# Patient Record
Sex: Female | Born: 1945
Health system: Southern US, Community
[De-identification: ages and names within clinical notes are randomized; demographics above are authoritative.]

## PROBLEM LIST (undated history)

## (undated) DIAGNOSIS — R011 Cardiac murmur, unspecified: Secondary | ICD-10-CM

## (undated) DIAGNOSIS — J189 Pneumonia, unspecified organism: Secondary | ICD-10-CM

## (undated) DIAGNOSIS — L509 Urticaria, unspecified: Secondary | ICD-10-CM

## (undated) DIAGNOSIS — I10 Essential (primary) hypertension: Secondary | ICD-10-CM

## (undated) DIAGNOSIS — M349 Systemic sclerosis, unspecified: Secondary | ICD-10-CM

## (undated) DIAGNOSIS — M1990 Unspecified osteoarthritis, unspecified site: Secondary | ICD-10-CM

## (undated) DIAGNOSIS — T783XXA Angioneurotic edema, initial encounter: Secondary | ICD-10-CM

## (undated) DIAGNOSIS — T7840XA Allergy, unspecified, initial encounter: Secondary | ICD-10-CM

## (undated) HISTORY — PX: JOINT REPLACEMENT: SHX530

## (undated) HISTORY — PX: TONSILLECTOMY: SUR1361

## (undated) HISTORY — DX: Systemic sclerosis, unspecified: M34.9

## (undated) HISTORY — DX: Unspecified osteoarthritis, unspecified site: M19.90

## (undated) HISTORY — DX: Allergy, unspecified, initial encounter: T78.40XA

## (undated) HISTORY — DX: Urticaria, unspecified: L50.9

## (undated) HISTORY — PX: ADENOIDECTOMY: SUR15

## (undated) HISTORY — DX: Angioneurotic edema, initial encounter: T78.3XXA

## (undated) HISTORY — PX: EYE SURGERY: SHX253

---

## 2003-01-03 ENCOUNTER — Emergency Department (HOSPITAL_COMMUNITY): Admission: EM | Admit: 2003-01-03 | Discharge: 2003-01-03 | Payer: Self-pay | Admitting: Emergency Medicine

## 2003-06-24 ENCOUNTER — Other Ambulatory Visit: Admission: RE | Admit: 2003-06-24 | Discharge: 2003-06-24 | Payer: Self-pay | Admitting: Internal Medicine

## 2003-06-30 ENCOUNTER — Ambulatory Visit (HOSPITAL_COMMUNITY): Admission: RE | Admit: 2003-06-30 | Discharge: 2003-06-30 | Payer: Self-pay | Admitting: Gastroenterology

## 2003-06-30 ENCOUNTER — Encounter (INDEPENDENT_AMBULATORY_CARE_PROVIDER_SITE_OTHER): Payer: Self-pay | Admitting: *Deleted

## 2003-09-25 ENCOUNTER — Encounter: Admission: RE | Admit: 2003-09-25 | Discharge: 2003-09-25 | Payer: Self-pay | Admitting: Dermatology

## 2005-09-12 ENCOUNTER — Emergency Department (HOSPITAL_COMMUNITY): Admission: EM | Admit: 2005-09-12 | Discharge: 2005-09-13 | Payer: Self-pay | Admitting: Emergency Medicine

## 2008-11-10 ENCOUNTER — Ambulatory Visit (HOSPITAL_COMMUNITY): Admission: RE | Admit: 2008-11-10 | Discharge: 2008-11-10 | Payer: Self-pay | Admitting: Family Medicine

## 2008-11-10 ENCOUNTER — Ambulatory Visit: Payer: Self-pay

## 2008-11-10 ENCOUNTER — Ambulatory Visit: Payer: Self-pay | Admitting: Internal Medicine

## 2008-11-10 ENCOUNTER — Encounter (INDEPENDENT_AMBULATORY_CARE_PROVIDER_SITE_OTHER): Payer: Self-pay | Admitting: Family Medicine

## 2008-12-02 ENCOUNTER — Telehealth (INDEPENDENT_AMBULATORY_CARE_PROVIDER_SITE_OTHER): Payer: Self-pay | Admitting: *Deleted

## 2008-12-02 ENCOUNTER — Encounter: Payer: Self-pay | Admitting: Cardiovascular Disease

## 2008-12-22 DIAGNOSIS — R011 Cardiac murmur, unspecified: Secondary | ICD-10-CM | POA: Insufficient documentation

## 2008-12-22 DIAGNOSIS — I10 Essential (primary) hypertension: Secondary | ICD-10-CM | POA: Insufficient documentation

## 2008-12-23 ENCOUNTER — Ambulatory Visit: Payer: Self-pay | Admitting: Cardiovascular Disease

## 2009-03-29 ENCOUNTER — Encounter: Admission: RE | Admit: 2009-03-29 | Discharge: 2009-03-29 | Payer: Self-pay | Admitting: Orthopedic Surgery

## 2009-03-31 ENCOUNTER — Encounter: Payer: Self-pay | Admitting: Cardiovascular Disease

## 2009-04-28 ENCOUNTER — Inpatient Hospital Stay (HOSPITAL_COMMUNITY): Admission: RE | Admit: 2009-04-28 | Discharge: 2009-05-01 | Payer: Self-pay | Admitting: Orthopedic Surgery

## 2009-05-26 ENCOUNTER — Inpatient Hospital Stay (HOSPITAL_COMMUNITY): Admission: RE | Admit: 2009-05-26 | Discharge: 2009-05-29 | Payer: Self-pay | Admitting: Orthopedic Surgery

## 2009-12-31 ENCOUNTER — Ambulatory Visit (HOSPITAL_COMMUNITY)
Admission: RE | Admit: 2009-12-31 | Discharge: 2009-12-31 | Payer: Self-pay | Source: Home / Self Care | Attending: Cardiovascular Disease | Admitting: Cardiovascular Disease

## 2009-12-31 ENCOUNTER — Encounter: Payer: Self-pay | Admitting: Cardiovascular Disease

## 2009-12-31 ENCOUNTER — Ambulatory Visit: Payer: Self-pay

## 2010-01-08 ENCOUNTER — Telehealth: Payer: Self-pay | Admitting: Cardiovascular Disease

## 2010-01-10 HISTORY — PX: SHOULDER ACROMIOPLASTY: SHX6093

## 2010-01-12 ENCOUNTER — Encounter: Payer: Self-pay | Admitting: Cardiovascular Disease

## 2010-01-12 ENCOUNTER — Ambulatory Visit
Admission: RE | Admit: 2010-01-12 | Discharge: 2010-01-12 | Payer: Self-pay | Source: Home / Self Care | Attending: Cardiovascular Disease | Admitting: Cardiovascular Disease

## 2010-02-09 NOTE — Consult Note (Signed)
Summary: [facility]  [facility] Form   Imported By: Sallee Provencal 12/25/2008 10:37:31  _____________________________________________________________________  External Attachment:    Type:   Image     Comment:   External Document

## 2010-02-09 NOTE — Assessment & Plan Note (Signed)
Summary: NP3/ MUMUR. SYSTOLIC  DISFUNTION/ PT HAS BCBS/ GD   Visit Type:  Initial Consult Primary Provider:  Dr Carlota Raspberry  CC:  heart murmur.  History of Present Illness: Linda Allen is seen today at the request of Merri Ray at [facility].  She has HTN and a murmur.  She had had significant wieght issues in the past being close to 350 lbs.  She has slowly lost weight but will likely need knee replacements.  She has had HTN for over 20 years.  She recently had BB's added to her ACE.  She has a history of murmur. I reviewed the echo she had her last month.  She has trivial AS with normal LV funciton and no other abnormalities.  She has some functional dyspnea and no SSCP.  No palpitatoins PND, or syncope.  Current Medications (verified): 1)  Lisinopril 20 Mg Tabs (Lisinopril) .... Take One Tablet By Mouth Daily 2)  Metoprolol Succinate 25 Mg Xr24h-Tab (Metoprolol Succinate) .... Take One Tablet By Mouth Daily  Allergies (verified): 1)  ! Pcn 2)  ! * Contrast Dye  Past History:  Past Medical History: Last updated: 12/22/2008 Current Problems:  MURMUR (ICD-785.2) HYPERTENSION (ICD-401.9)  Past Surgical History: Last updated: 12/22/2008   Screening colonoscopy.  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 All other systems reviewed and negative  Vital Signs:  Patient profile:   65 year old female Height:      68 inches Weight:      288 pounds BMI:     43.95 BP sitting:   139 / 75  (left arm) Cuff size:   large  Vitals Entered By: Lubertha Basque, CNA (December 23, 2008 8:31 AM)  Physical  Exam  General:  Affect appropriate Healthy:  appears stated age 66: normal Neck supple with no adenopathy JVP normal no bruits no thyromegaly Lungs clear with no wheezing and good diaphragmatic motion no rub, gallop or click PMI normal Abdomen: benighn, BS positve, no tenderness, no AAA no bruit.  No HSM or HJR Distal pulses intact with no bruits No edema Neuro non-focal Skin warm and dry    Impression & Recommendations:  Problem # 1:  MURMUR (ICD-785.2)  Mild AS F/U echo in a year Her updated medication list for this problem includes:    Lisinopril 20 Mg Tabs (Lisinopril) .Marland Kitchen... Take one tablet by mouth daily    Metoprolol Succinate 25 Mg Xr24h-tab (Metoprolol succinate) .Marland Kitchen... Take one tablet by mouth daily  Orders: EKG w/ Interpretation (93000)  Her updated medication list for this problem includes:    Lisinopril 20 Mg Tabs (Lisinopril) .Marland Kitchen... Take one tablet by mouth daily    Metoprolol Succinate 25 Mg Xr24h-tab (Metoprolol succinate) .Marland Kitchen... Take one tablet by mouth daily  Problem # 2:  HYPERTENSION (ICD-401.9) F/U with Dr Carlota Raspberry.  Continue low sodium diet and 2 drug Rx Her updated medication list for this problem includes:    Lisinopril 20 Mg Tabs (Lisinopril) .Marland Kitchen... Take one tablet by mouth daily    Metoprolol Succinate 25 Mg Xr24h-tab (Metoprolol succinate) .Marland Kitchen... Take one tablet by mouth daily  Patient Instructions: 1)  Your physician recommends that you schedule a follow-up appointment in:  [place] ECHO IN YEAR 2)  Your physician recommends that you continue on your current medications as directed. Please refer to the Current Medication list given to you today. 3)  Your physician has requested that you have an echocardiogram.  Echocardiography is a painless test that uses sound waves to create images of your heart. It provides your doctor with information about the size and shape of your heart and how well your heart's chambers and valves are  working.  This procedure takes approximately one hour. There are no restrictions for this procedure.   EKG Report  Procedure date:  12/23/2008  Findings:      NSR 72 Normal ECG

## 2010-02-09 NOTE — Letter (Signed)
Summary: [facility] Surgical Clearance   [facility] Surgical Clearance   Imported By: Sallee Provencal 04/15/2009 16:12:55  _____________________________________________________________________  External Attachment:    Type:   Image     Comment:   External Document

## 2010-02-09 NOTE — Progress Notes (Signed)
  Phone Note From Other Clinic   Caller: Mariann Laster Initial call taken by: Lanice Schwab Echo over to Urgent Care to fax [phone] [facility]  December 02, 2008 2:00 PM

## 2010-02-11 NOTE — Assessment & Plan Note (Signed)
Summary: yearly/sl   Visit Type:  1 yr f/u Primary Provider:  Corliss Parish  CC:  no cardiac complaints today.  History of Present Illness: Linda Allen is seen today at the request of Merri Ray at [facility].  She has HTN and a murmur.  She had had significant wieght issues in the past being close to 350 lbs.  She has slowly lost weight but will likely need knee replacements.  She has had HTN for over 20 years.  She recently had BB's added to her ACE.  She has a history of murmur. I reviewed the echo she had her 12/11 and there was mild AS   She has some functional dyspnea and no SSCP.  No palpitatoins PND, or syncope.  Current Medications (verified): 1)  Lisinopril 20 Mg Tabs (Lisinopril) .... Take One Tablet By Mouth Daily 2)  Metoprolol Succinate 25 Mg Xr24h-Tab (Metoprolol Succinate) .... Take One Tablet By Mouth Daily  Allergies: 1)  ! Pcn 2)  ! * Contrast Dye  Vital Signs:  Patient profile:   65 year old female Height:      68 inches Weight:      288.25 pounds BMI:     43.99 Pulse rate:   87 / minute Pulse rhythm:   irregular BP sitting:   128 / 78  (left arm) Cuff size:   large  Vitals Entered By: Julaine Hua, CMA (January 12, 2010 8:41 AM)  Physical Exam  General:  Affect appropriate Healthy:  appears stated age HEENT: normal Neck supple with no adenopathy JVP normal no bruits no thyromegaly Lungs clear with no wheezing and good diaphragmatic motion Heart:  S1/S2  mild AS murmur,rub, gallop or click PMI normal Abdomen: benighn, BS positve, no tenderness, no AAA no bruit.  No HSM or HJR Distal pulses intact with no bruits No edema Neuro non-focal Skin warm and dry    Impression & Recommendations:  Problem # 1:  MURMUR (ICD-785.2)  Mild AS no change in murmur.  Echo in a year Her updated medication list for this problem includes:    Lisinopril 20 Mg Tabs (Lisinopril) .Marland Kitchen... Take one tablet by mouth daily    Metoprolol Succinate 25 Mg Xr24h-tab  (Metoprolol succinate) .Marland Kitchen... Take one tablet by mouth daily  Her updated medication list for this problem includes:    Lisinopril 20 Mg Tabs (Lisinopril) .Marland Kitchen... Take one tablet by mouth daily    Metoprolol Succinate 25 Mg Xr24h-tab (Metoprolol succinate) .Marland Kitchen... Take one tablet by mouth daily  Orders: Echocardiogram (Echo)  Problem # 2:  HYPERTENSION (ICD-401.9)  Well controlled Her updated medication list for this problem includes:    Lisinopril 20 Mg Tabs (Lisinopril) .Marland Kitchen... Take one tablet by mouth daily    Metoprolol Succinate 25 Mg Xr24h-tab (Metoprolol succinate) .Marland Kitchen... Take one tablet by mouth daily  Orders: EKG w/ Interpretation (93000)  Her updated medication list for this problem includes:    Lisinopril 20 Mg Tabs (Lisinopril) .Marland Kitchen... Take one tablet by mouth daily    Metoprolol Succinate 25 Mg Xr24h-tab (Metoprolol succinate) .Marland Kitchen... Take one tablet by mouth daily  Problem # 3:  MORBID OBESITY (ICD-278.01) Has had bilateral knee surgery with increased mobility Also right shoulder surgery.  Rehab has gone well.  She is very motivated to exercise and think she will do well.  Patient Instructions: 1)  Your physician recommends that you schedule a follow-up appointment in: [place] 2)  Your physician recommends that you continue on your  current medications as directed. Please refer to the Current Medication list given to you today. 3)  Your physician has requested that you have an echocardiogram.  Echocardiography is a painless test that uses sound waves to create images of your heart. It provides your doctor with information about the size and shape of your heart and how well your heart's chambers and valves are working.  This procedure takes approximately one hour. There are no restrictions for this procedure.ECHO SAME DAY

## 2010-02-11 NOTE — Progress Notes (Signed)
Summary: echo results**2nd call**  Phone Note Call from Patient Call back at Home Phone [phone]   Caller: Patient Reason for Call: Talk to Nurse, Lab or Test Results Summary of Call: pt calling re echo results. Initial call taken by: Regan Lemming,  January 08, 2010 8:59 AM  Follow-up for Phone Call        pt has to go out and she was rtning your call from yesterday but you can call and talk to her husband because she has to go out Shelda Pal  January 08, 2010 9:27 AM     Additional Follow-up for Phone Call Additional follow up Details #1::        Pt husband is aware of results. Horton Chin RN

## 2010-03-29 LAB — BASIC METABOLIC PANEL
BUN: 12 mg/dL (ref 6–23)
BUN: 18 mg/dL (ref 6–23)
CO2: 28 mEq/L (ref 19–32)
CO2: 29 mEq/L (ref 19–32)
Calcium: 8.5 mg/dL (ref 8.4–10.5)
Calcium: 8.8 mg/dL (ref 8.4–10.5)
Chloride: 103 mEq/L (ref 96–112)
Chloride: 106 mEq/L (ref 96–112)
Creatinine, Ser: 0.85 mg/dL (ref 0.4–1.2)
Creatinine, Ser: 0.9 mg/dL (ref 0.4–1.2)
GFR calc Af Amer: >60 mL/min (ref >60)
GFR calc Af Amer: >60 mL/min (ref >60)
GFR calc non Af Amer: >60 mL/min (ref >60)
GFR calc non Af Amer: >60 mL/min (ref >60)
Glucose, Bld: 100 mg/dL — ABNORMAL HIGH (ref 70–99)
Glucose, Bld: 121 mg/dL — ABNORMAL HIGH (ref 70–99)
Potassium: 3.8 mEq/L (ref 3.5–5.1)
Potassium: 4.8 mEq/L (ref 3.5–5.1)
Sodium: 136 mEq/L (ref 135–145)
Sodium: 140 mEq/L (ref 135–145)

## 2010-03-29 LAB — CBC
HCT: 24 % — ABNORMAL LOW (ref 36.0–46.0)
HCT: 25.1 % — ABNORMAL LOW (ref 36.0–46.0)
Hemoglobin: 7.9 g/dL — ABNORMAL LOW (ref 12.0–15.0)
Hemoglobin: 8.4 g/dL — ABNORMAL LOW (ref 12.0–15.0)
MCHC: 33 g/dL (ref 30.0–36.0)
MCHC: 33.3 g/dL (ref 30.0–36.0)
MCV: 96.2 fL (ref 78.0–100.0)
MCV: 96.6 fL (ref 78.0–100.0)
Platelets: 324 10*3/uL (ref 150–400)
Platelets: 348 10*3/uL (ref 150–400)
RBC: 2.49 MIL/uL — ABNORMAL LOW (ref 3.87–5.11)
RBC: 2.6 MIL/uL — ABNORMAL LOW (ref 3.87–5.11)
RDW: 14.8 % (ref 11.5–15.5)
RDW: 14.8 % (ref 11.5–15.5)
WBC: 7 10*3/uL (ref 4.0–10.5)
WBC: 7.6 10*3/uL (ref 4.0–10.5)

## 2010-03-29 LAB — TYPE AND SCREEN
ABO/RH(D): AB POS
Antibody Screen: NEGATIVE

## 2010-03-30 LAB — CBC
HCT: 30.9 % — ABNORMAL LOW (ref 36.0–46.0)
HCT: 30.8 % — ABNORMAL LOW (ref 36.0–46.0)
HCT: 32 % — ABNORMAL LOW (ref 36.0–46.0)
Hemoglobin: 10.1 g/dL — ABNORMAL LOW (ref 12.0–15.0)
Hemoglobin: 10.3 g/dL — ABNORMAL LOW (ref 12.0–15.0)
Hemoglobin: 10.6 g/dL — ABNORMAL LOW (ref 12.0–15.0)
MCHC: 32.7 g/dL (ref 30.0–36.0)
MCHC: 33.2 g/dL (ref 30.0–36.0)
MCHC: 33.4 g/dL (ref 30.0–36.0)
MCV: 96.6 fL (ref 78.0–100.0)
MCV: 97.3 fL (ref 78.0–100.0)
MCV: 97.7 fL (ref 78.0–100.0)
Platelets: 483 10*3/uL — ABNORMAL HIGH (ref 150–400)
Platelets: 299 10*3/uL (ref 150–400)
Platelets: 318 10*3/uL (ref 150–400)
RBC: 3.2 MIL/uL — ABNORMAL LOW (ref 3.87–5.11)
RBC: 3.17 MIL/uL — ABNORMAL LOW (ref 3.87–5.11)
RBC: 3.27 MIL/uL — ABNORMAL LOW (ref 3.87–5.11)
RDW: 15.5 % (ref 11.5–15.5)
RDW: 15.9 % — ABNORMAL HIGH (ref 11.5–15.5)
RDW: 16 % — ABNORMAL HIGH (ref 11.5–15.5)
WBC: 7.6 10*3/uL (ref 4.0–10.5)
WBC: 7.4 10*3/uL (ref 4.0–10.5)
WBC: 8 10*3/uL (ref 4.0–10.5)

## 2010-03-30 LAB — DIFFERENTIAL
Basophils Absolute: 0 10*3/uL (ref 0.0–0.1)
Basophils Relative: 0 % (ref 0–1)
Eosinophils Absolute: 0.3 10*3/uL (ref 0.0–0.7)
Eosinophils Relative: 3 % (ref 0–5)
Lymphocytes Relative: 27 % (ref 12–46)
Lymphs Abs: 2.1 10*3/uL (ref 0.7–4.0)
Monocytes Absolute: 0.5 10*3/uL (ref 0.1–1.0)
Monocytes Relative: 7 % (ref 3–12)
Neutro Abs: 4.7 10*3/uL (ref 1.7–7.7)
Neutrophils Relative %: 62 % (ref 43–77)

## 2010-03-30 LAB — BASIC METABOLIC PANEL
BUN: 19 mg/dL (ref 6–23)
BUN: 16 mg/dL (ref 6–23)
BUN: 22 mg/dL (ref 6–23)
CO2: 29 mEq/L (ref 19–32)
CO2: 28 mEq/L (ref 19–32)
CO2: 28 mEq/L (ref 19–32)
Calcium: 9.6 mg/dL (ref 8.4–10.5)
Calcium: 8.7 mg/dL (ref 8.4–10.5)
Calcium: 9.1 mg/dL (ref 8.4–10.5)
Chloride: 106 mEq/L (ref 96–112)
Chloride: 106 mEq/L (ref 96–112)
Chloride: 106 mEq/L (ref 96–112)
Creatinine, Ser: 0.92 mg/dL (ref 0.4–1.2)
Creatinine, Ser: 0.83 mg/dL (ref 0.4–1.2)
Creatinine, Ser: 1 mg/dL (ref 0.4–1.2)
GFR calc Af Amer: >60 mL/min (ref >60)
GFR calc Af Amer: >60 mL/min (ref >60)
GFR calc Af Amer: >60 mL/min (ref >60)
GFR calc non Af Amer: >60 mL/min (ref >60)
GFR calc non Af Amer: 56 mL/min — ABNORMAL LOW (ref >60)
GFR calc non Af Amer: >60 mL/min (ref >60)
Glucose, Bld: 95 mg/dL (ref 70–99)
Glucose, Bld: 107 mg/dL — ABNORMAL HIGH (ref 70–99)
Glucose, Bld: 108 mg/dL — ABNORMAL HIGH (ref 70–99)
Potassium: 4.1 mEq/L (ref 3.5–5.1)
Potassium: 4.6 mEq/L (ref 3.5–5.1)
Potassium: 4.9 mEq/L (ref 3.5–5.1)
Sodium: 141 mEq/L (ref 135–145)
Sodium: 137 mEq/L (ref 135–145)
Sodium: 139 mEq/L (ref 135–145)

## 2010-03-30 LAB — URINALYSIS, ROUTINE W REFLEX MICROSCOPIC
Glucose, UA: NEGATIVE mg/dL
Hgb urine dipstick: NEGATIVE
Leukocytes, UA: NEGATIVE
Nitrite: NEGATIVE
Protein, ur: NEGATIVE mg/dL
Specific Gravity, Urine: 1.031 — ABNORMAL HIGH (ref 1.005–1.030)
Urobilinogen, UA: 0.2 mg/dL (ref 0.0–1.0)
pH: 5 (ref 5.0–8.0)

## 2010-03-30 LAB — TYPE AND SCREEN
ABO/RH(D): AB POS
Antibody Screen: NEGATIVE

## 2010-03-30 LAB — URINE MICROSCOPIC-ADD ON

## 2010-03-30 LAB — PROTIME-INR
INR: 1.08 (ref 0.00–1.49)
Prothrombin Time: 13.9 seconds (ref 11.6–15.2)

## 2010-03-30 LAB — APTT: aPTT: 33 seconds (ref 24–37)

## 2010-03-30 LAB — ABO/RH: ABO/RH(D): AB POS

## 2010-03-31 LAB — URINE MICROSCOPIC-ADD ON

## 2010-03-31 LAB — URINALYSIS, ROUTINE W REFLEX MICROSCOPIC
Bilirubin Urine: NEGATIVE
Glucose, UA: NEGATIVE mg/dL
Hgb urine dipstick: NEGATIVE
Ketones, ur: NEGATIVE mg/dL
Nitrite: NEGATIVE
Protein, ur: NEGATIVE mg/dL
Specific Gravity, Urine: 1.022 (ref 1.005–1.030)
Urobilinogen, UA: 0.2 mg/dL (ref 0.0–1.0)
pH: 5 (ref 5.0–8.0)

## 2010-03-31 LAB — PROTIME-INR
INR: 0.89 (ref 0.00–1.49)
Prothrombin Time: 12 seconds (ref 11.6–15.2)

## 2010-03-31 LAB — BASIC METABOLIC PANEL
BUN: 17 mg/dL (ref 6–23)
CO2: 30 mEq/L (ref 19–32)
Calcium: 9.5 mg/dL (ref 8.4–10.5)
Chloride: 105 mEq/L (ref 96–112)
Creatinine, Ser: 1.01 mg/dL (ref 0.4–1.2)
GFR calc Af Amer: >60 mL/min (ref >60)
GFR calc non Af Amer: 56 mL/min — ABNORMAL LOW (ref >60)
Glucose, Bld: 81 mg/dL (ref 70–99)
Potassium: 4.1 mEq/L (ref 3.5–5.1)
Sodium: 142 mEq/L (ref 135–145)

## 2010-03-31 LAB — DIFFERENTIAL
Basophils Absolute: 0 10*3/uL (ref 0.0–0.1)
Basophils Relative: 1 % (ref 0–1)
Eosinophils Absolute: 0.2 10*3/uL (ref 0.0–0.7)
Eosinophils Relative: 3 % (ref 0–5)
Lymphocytes Relative: 39 % (ref 12–46)
Lymphs Abs: 2.3 10*3/uL (ref 0.7–4.0)
Monocytes Absolute: 0.6 10*3/uL (ref 0.1–1.0)
Monocytes Relative: 9 % (ref 3–12)
Neutro Abs: 2.8 10*3/uL (ref 1.7–7.7)
Neutrophils Relative %: 48 % (ref 43–77)

## 2010-03-31 LAB — CBC
HCT: 38 % (ref 36.0–46.0)
Hemoglobin: 12.8 g/dL (ref 12.0–15.0)
MCHC: 33.7 g/dL (ref 30.0–36.0)
MCV: 96.5 fL (ref 78.0–100.0)
Platelets: 341 10*3/uL (ref 150–400)
RBC: 3.93 MIL/uL (ref 3.87–5.11)
RDW: 16 % — ABNORMAL HIGH (ref 11.5–15.5)
WBC: 5.9 10*3/uL (ref 4.0–10.5)

## 2010-03-31 LAB — APTT: aPTT: 30 seconds (ref 24–37)

## 2010-05-28 NOTE — Op Note (Signed)
NAME:  Linda Allen, Linda Allen                     ACCOUNT NO.:  [id number]   MEDICAL RECORD NO.:  [id number]                   PATIENT TYPE:  AMB   LOCATION:  ENDO                                 FACILITY:  [facility]   PHYSICIAN:  Nelwyn Salisbury, M.D.               DATE OF BIRTH:  04-19-1945   DATE OF PROCEDURE:  06/30/2003  DATE OF DISCHARGE:                                 OPERATIVE REPORT   PROCEDURE:  Screening colonoscopy.   ENDOSCOPIST:  Juanita Craver, M.D.   INSTRUMENT USED:  Olympus video colonoscope.   INDICATIONS FOR PROCEDURE:  64 year old white female undergoing screening  colonoscopy for Allen family history of colon cancer in Allen first cousin, rule out  colonic polyps, masses, etc.   PREPROCEDURE PREPARATION:  Informed consent was obtained from the patient.  The patient was fasted for eight hours prior to the procedure and prepped  with Allen bottle of magnesium citrate and Allen gallon of GoLYTELY the night prior  to the procedure.   PREPROCEDURE PHYSICAL:  Patient with stable vital signs.  Neck supple.  Chest clear to auscultation.  S1 and S2 regular.  Abdomen soft with normal  bowel sounds.   DESCRIPTION OF PROCEDURE:  The patient was placed in the left lateral  decubitus position, sedated with 100 mg of Demerol and 10 mg Versed in slow  incremental doses.  Once the patient was adequately sedated, maintained on  low flow oxygen and continuous cardiac monitoring, the Olympus video  colonoscope was advanced from the rectum to the cecum.  There was some  residual stool in the right colon.  Multiple washings were done.  Two small  sessile polyps were biopsied from 15 cm.  Sigmoid diverticula were noted.  Retroflexion in the rectum revealed no abnormalities.  There were prominent  external hemorrhoids on anal inspection.  The patient tolerated the  procedure well without immediate complications.   IMPRESSION:  1. Prominent external hemorrhoids.  2. Sigmoid diverticulosis.  3. Two  small polyps biopsied from 15 cm.   RECOMMENDATIONS:  1. Await pathology results.  2. Repeat colonoscopy depending on pathology results.  3. High fiber diet with liberal fluid intake.  4. Brochures on diverticulosis have been given to the patient for education.  5. Outpatient follow up as need arises in the future.                                               Nelwyn Salisbury, M.D.    JNM/MEDQ  D:  06/30/2003  T:  06/30/2003  Job:  [id number]   cc:   Harriette Bouillon, M.D.  [place] [place]  [place] [address]  Fax: [phone]

## 2011-02-25 ENCOUNTER — Ambulatory Visit: Payer: Self-pay | Admitting: Family Medicine

## 2011-02-28 ENCOUNTER — Encounter (HOSPITAL_COMMUNITY): Payer: Self-pay | Admitting: *Deleted

## 2011-02-28 ENCOUNTER — Emergency Department (HOSPITAL_COMMUNITY)
Admission: EM | Admit: 2011-02-28 | Discharge: 2011-02-28 | Disposition: A | Discharge status: Home / Self Care | Payer: BC Managed Care – PPO | Attending: Emergency Medicine | Admitting: Emergency Medicine

## 2011-02-28 DIAGNOSIS — S91009A Unspecified open wound, unspecified ankle, initial encounter: Secondary | ICD-10-CM | POA: Insufficient documentation

## 2011-02-28 DIAGNOSIS — M79609 Pain in unspecified limb: Secondary | ICD-10-CM | POA: Insufficient documentation

## 2011-02-28 DIAGNOSIS — S81009A Unspecified open wound, unspecified knee, initial encounter: Secondary | ICD-10-CM | POA: Insufficient documentation

## 2011-02-28 DIAGNOSIS — S81812A Laceration without foreign body, left lower leg, initial encounter: Secondary | ICD-10-CM

## 2011-02-28 DIAGNOSIS — W268XXA Contact with other sharp object(s), not elsewhere classified, initial encounter: Secondary | ICD-10-CM | POA: Insufficient documentation

## 2011-02-28 DIAGNOSIS — I10 Essential (primary) hypertension: Secondary | ICD-10-CM | POA: Insufficient documentation

## 2011-02-28 HISTORY — DX: Essential (primary) hypertension: I10

## 2011-02-28 NOTE — Discharge Instructions (Signed)
Please read and follow instructions below.   You will need to see your family doctor or go to Urgent care in 10-14 days to have the sutures removed.    Keep the wound covered and dry tonight.  You may wash with warm, soapy water beginning tomorrow.  Do not soak wound in water.   Please returnsooner with worsening pain, worsening redness or swelling around the wound, drainage from the wound, or if you have any other concerns.

## 2011-02-28 NOTE — ED Notes (Signed)
Patient with left shin laceration presenting to ED.  No active bleeding at this time. Patient states the it was lacerated by brick.

## 2011-02-28 NOTE — ED Provider Notes (Signed)
History     CSN: [id number]  Arrival date & time 02/28/11  2014   First MD Initiated Contact with Patient 02/28/11 2109      Chief Complaint  Patient presents with  . Extremity Laceration    (Consider location/radiation/quality/duration/timing/severity/associated sxs/prior treatment) HPI Comments: The patient presents with laceration to left shin sustained after she walked into a brick. The patient's 'wiped off' the wound prior to arrival but did not thoroughly clean the wound. She was wearing blue jeans and does not suspect any FB. Tetanus is up-to-date. Denies numbness, tingling, or weakness in her lower extremity. Patient is ambulatory. She denies history of diabetes or other immunosuppressant states.  Patient is a 66 y.o. female presenting with skin laceration. The history is provided by the patient.  Laceration  The incident occurred 3 to 5 hours ago. The laceration is 2 cm in size. Injury mechanism: brick. The pain is mild. The pain has been constant since onset. Her tetanus status is UTD.    Past Medical History  Diagnosis Date  . Hypertension     History reviewed. No pertinent past surgical history.  History reviewed. No pertinent family history.  History  Substance Use Topics  . Smoking status: Never Smoker   . Smokeless tobacco: Not on file  . Alcohol Use: Yes    OB History    Grav Para Term Preterm Abortions TAB SAB Ect Mult Living                  Review of Systems  Constitutional: Negative for activity change.  HENT: Negative for neck pain.   Musculoskeletal: Negative for back pain, joint swelling, arthralgias and gait problem.  Skin: Positive for wound.  Neurological: Negative for weakness and numbness.    Allergies  Penicillins  Home Medications   Current Outpatient Rx  Name Route Sig Dispense Refill  . IBUPROFEN 200 MG PO TABS Oral Take 200 mg by mouth every 6 (six) hours as needed. For pain    . LISINOPRIL 20 MG PO TABS Oral Take 20 mg by  mouth daily.    Marland Kitchen METOPROLOL SUCCINATE ER 25 MG PO TB24 Oral Take 25 mg by mouth daily.      BP 178/94  Pulse 76  Temp(Src) 98.1 F (36.7 C) (Oral)  Resp 16  SpO2 100%  Physical Exam  Nursing note and vitals reviewed. Constitutional: She is oriented to person, place, and time. She appears well-developed and well-nourished.  HENT:  Head: Normocephalic and atraumatic.  Eyes: Pupils are equal, round, and reactive to light.  Neck: Normal range of motion. Neck supple.  Cardiovascular: Exam reveals no decreased pulses.   Pulses:      Dorsalis pedis pulses are 2+ on the right side, and 2+ on the left side.       Posterior tibial pulses are 2+ on the right side, and 2+ on the left side.  Musculoskeletal: She exhibits tenderness. She exhibits no edema.       Legs: Neurological: She is alert and oriented to person, place, and time. No sensory deficit.       Motor, sensation, and vascular distal to the injury is fully intact.   Skin: Skin is warm and dry.  Psychiatric: She has a normal mood and affect.    ED Course  Procedures (including critical care time)  Labs Reviewed - No data to display No results found.   1. Laceration of left lower extremity     9:54 PM Patient seen  and examined. Will clean wound.  Vital signs reviewed and are as follows: Filed Vitals:   02/28/11 2021  BP: 178/94  Pulse: 76  Temp: 98.1 F (36.7 C)  Resp: 16   Hypertension noted.  Wound was cleaned with dermal wound cleanser and normal saline under pressure.  LACERATION REPAIR Performed by: Faustino Congress Authorized by: Faustino Congress Consent: Verbal consent obtained. Risks and benefits: risks, benefits and alternatives were discussed Consent given by: patient Patient identity confirmed: provided demographic data Prepped and Draped in normal sterile fashion Wound explored  Laceration Location: left shin  Laceration Length: 2cm total  No Foreign Bodies seen or palpated  Anesthesia:  local infiltration  Local anesthetic: lidocaine 2% with epinephrine  Anesthetic total: 2 ml  Irrigation method: syringe using IV catheter, dermal wound cleanser Amount of cleaning: standard  Skin closure: Ethilon 4-0  Number of sutures: 2  Technique: simple interrupted  Patient tolerance: Patient tolerated the procedure well with no immediate complications.  The patient was urged to return to the Emergency Department urgently with worsening pain, swelling, expanding erythema especially if it streaks away from the affected area, fever, or if they have any other concerns. Patient verbalized understanding.   Urged to see PCP or return in 10-14 days for wound recheck and suture removal.    MDM  Patient with laceration, amenable to suturing. Tetanus UTD. Do not suspect FB.         Faustino Congress, [place] 03/01/11 [phone]

## 2011-03-02 ENCOUNTER — Telehealth: Payer: Self-pay | Admitting: Radiology

## 2011-03-02 NOTE — Telephone Encounter (Signed)
This pt states she was here on Monday night after closing time and another pt let her in after the door was locked.  Pt stated that she was upset that she was turned away and clerical staff member (the person she describe sounded like it could have been Dortha Kern).  Pt had called and the phones said we were opened til 9pm so she drove across town and get here and the door was locked and she figured that we were so pack that we had just locked the door.  She stated that she was bleeding all over her leg and bleeding through bandages.  I asked did the clerical staff advised the clinical teamleader or provider and she stated the they did not.  Pt went to ER and even though she had to spend $230 she was actually treated like a person.  Advised pt that I will let our clinical and clerical managers know about this situation.  Pt thanked me for listening and I apologized again.

## 2011-03-02 NOTE — ED Provider Notes (Signed)
Medical screening examination/treatment/procedure(s) were performed by non-physician practitioner and as supervising physician I was immediately available for consultation/collaboration.  Leota Jacobsen, MD 03/02/11 [phone]

## 2011-03-02 NOTE — Telephone Encounter (Signed)
Left mssg for her to call back Dr Carlota Raspberry wanted to make sure she has gotten results of her Bone Density and Mammogram Linda Allen

## 2011-03-02 NOTE — Telephone Encounter (Signed)
Patient was called per Dr Carlota Raspberry about her bone density and mammogram. She did not get bone density I advised her it is normal and she needs repeat in 5 years. I mailed her the report. She asked to speak to someone in charge so I sent her Call to [facility]

## 2011-03-03 NOTE — Telephone Encounter (Signed)
Called patient and gave our sincere apologizes for how her care was handled when she came in with an emergency. Patient states that she could not believe that she was turned away when she was bleeding. She was treated at [facility] and they handled her with great care.  She appreciated the effort made to apologize to her.

## 2011-05-03 ENCOUNTER — Other Ambulatory Visit: Payer: Self-pay | Admitting: Family Medicine

## 2011-08-17 ENCOUNTER — Other Ambulatory Visit: Payer: Self-pay | Admitting: Physician Assistant

## 2011-09-19 ENCOUNTER — Other Ambulatory Visit: Payer: Self-pay | Admitting: Physician Assistant

## 2011-09-19 NOTE — Telephone Encounter (Signed)
Chart pulled to PA pool at nurse station [phone]

## 2011-09-19 NOTE — Telephone Encounter (Signed)
Please pull paper chart.  

## 2011-09-19 NOTE — Telephone Encounter (Signed)
Chart pulled to PA pool at nurse station [phone]

## 2011-09-20 NOTE — Telephone Encounter (Signed)
Patient's chart is at the nurses station in the pa pool pile.  [facility] [id number]

## 2011-09-30 ENCOUNTER — Other Ambulatory Visit: Payer: Self-pay | Admitting: Physician Assistant

## 2011-10-17 ENCOUNTER — Encounter: Payer: Self-pay | Admitting: Family Medicine

## 2011-10-17 ENCOUNTER — Ambulatory Visit (INDEPENDENT_AMBULATORY_CARE_PROVIDER_SITE_OTHER): Payer: BC Managed Care – PPO | Admitting: Family Medicine

## 2011-10-17 VITALS — BP 152/80 | HR 72 | Temp 98.2°F | Resp 16 | Ht 68.0 in | Wt 320.4 lb

## 2011-10-17 DIAGNOSIS — I10 Essential (primary) hypertension: Secondary | ICD-10-CM

## 2011-10-17 DIAGNOSIS — E669 Obesity, unspecified: Secondary | ICD-10-CM

## 2011-10-17 LAB — BASIC METABOLIC PANEL
BUN: 29 mg/dL — ABNORMAL HIGH (ref 6–23)
CO2: 26 mEq/L (ref 19–32)
Calcium: 9.8 mg/dL (ref 8.4–10.5)
Chloride: 106 mEq/L (ref 96–112)
Creat: 1.14 mg/dL — ABNORMAL HIGH (ref 0.50–1.10)
Glucose, Bld: 102 mg/dL — ABNORMAL HIGH (ref 70–99)
Potassium: 4.8 mEq/L (ref 3.5–5.3)
Sodium: 139 mEq/L (ref 135–145)

## 2011-10-17 MED ORDER — LISINOPRIL-HYDROCHLOROTHIAZIDE 20-12.5 MG PO TABS: 1.0000 | ORAL_TABLET | Freq: Every day | ORAL | Status: DC

## 2011-10-17 MED ORDER — METOPROLOL SUCCINATE ER 25 MG PO TB24: 25.0000 mg | ORAL_TABLET | Freq: Every day | ORAL | Status: DC

## 2011-10-17 NOTE — Patient Instructions (Signed)
Your should receive a call or letter about your lab results within the next week to 10 days.  Schedule a physical with fasting lab work in the next 3 months.  If any lightheadedness or new side effects with blood pressure medicine change - return to clinic.  Keep a record of your blood pressures outside of the office and bring them to the next office visit.

## 2011-10-17 NOTE — Progress Notes (Signed)
  Subjective:    Patient ID: Linda Allen, female    DOB: 22-Dec-1945, 66 y.o.   MRN: [id number]  HPI  HTN - takes lisinopril 20mg  qd, toprol 25mg  qd. Creat 0.85, glucose 121 at hospital 5/11, cr 0.92, glucose 88 11/05/10.  Outside BP's:130-150/80's. No missed doses of meds. Not fasting today. Last ate ate 2 1/2 hours ago.   Obesity - weight 306 at 11/05/10 office visit. Now 320.  Riding bike everyday, walking 3-4 times per week.  Not doing great with diet in past.  Knows what do, just not doing it.  Eating desserts, comfort foods.  Better recently,   Allergic rhinitis - zyrtec recently.  Didn't like nasal spray.  Zyrtec works well.  Review of Systems  Constitutional: Negative for fatigue and unexpected weight change.  Respiratory: Negative for chest tightness and shortness of breath.   Cardiovascular: Negative for chest pain, palpitations and leg swelling.  Gastrointestinal: Negative for abdominal pain and blood in stool.  Neurological: Negative for dizziness, syncope, light-headedness and headaches.       Objective:   Physical Exam  Constitutional: She is oriented to person, place, and time. She appears well-developed and well-nourished.  HENT:  Head: Normocephalic and atraumatic.  Eyes: Conjunctivae normal and EOM are normal. Pupils are equal, round, and reactive to light.  Neck: Carotid bruit is not present.  Cardiovascular: Normal rate, regular rhythm, normal heart sounds and intact distal pulses.   Pulmonary/Chest: Effort normal and breath sounds normal.  Abdominal: Soft. She exhibits no pulsatile midline mass. There is no tenderness.  Neurological: She is alert and oriented to person, place, and time.  Skin: Skin is warm and dry.  Psychiatric: She has a normal mood and affect. Her behavior is normal.       Assessment & Plan:  Linda Allen is a 66 y.o. female 1. HTN (hypertension)  lisinopril-hydrochlorothiazide (ZESTORETIC) 20-12.5 MG per tablet, metoprolol  succinate (TOPROL-XL) 25 MG 24 hr tablet, Basic metabolic panel  2. Obesity      HTN - uncontrolled, likely in part with weight gain. - add in hctz 12.5mg  qd to lisinopril 20mg  qd. Continue toprol at current dose. Check BMP.   Obesity - counselled on avoidance of guilty foods and other coping mechanisms. Discussed risks of dm and other comorbidities with obesity.  Declined nutritionist at this point.   Patient Instructions  Your should receive a call or letter about your lab results within the next week to 10 days.  Schedule a physical with fasting lab work in the next 3 months.  If any lightheadedness or new side effects with blood pressure medicine change - return to clinic.  Keep a record of your blood pressures outside of the office and bring them to the next office visit.

## 2011-10-19 ENCOUNTER — Other Ambulatory Visit: Payer: Self-pay | Admitting: Family Medicine

## 2011-10-19 DIAGNOSIS — R7989 Other specified abnormal findings of blood chemistry: Secondary | ICD-10-CM

## 2012-01-30 ENCOUNTER — Encounter: Payer: BC Managed Care – PPO | Admitting: Family Medicine

## 2012-04-09 ENCOUNTER — Encounter: Payer: Self-pay | Admitting: Family Medicine

## 2012-04-09 ENCOUNTER — Ambulatory Visit (INDEPENDENT_AMBULATORY_CARE_PROVIDER_SITE_OTHER): Payer: BC Managed Care – PPO | Admitting: Family Medicine

## 2012-04-09 VITALS — BP 122/76 | HR 67 | Temp 98.1°F | Resp 16 | Ht 68.0 in | Wt 311.8 lb

## 2012-04-09 DIAGNOSIS — Z0000 Encounter for general adult medical examination without abnormal findings: Secondary | ICD-10-CM

## 2012-04-09 DIAGNOSIS — R011 Cardiac murmur, unspecified: Secondary | ICD-10-CM

## 2012-04-09 DIAGNOSIS — E669 Obesity, unspecified: Secondary | ICD-10-CM

## 2012-04-09 DIAGNOSIS — J309 Allergic rhinitis, unspecified: Secondary | ICD-10-CM

## 2012-04-09 DIAGNOSIS — I10 Essential (primary) hypertension: Secondary | ICD-10-CM

## 2012-04-09 DIAGNOSIS — J301 Allergic rhinitis due to pollen: Secondary | ICD-10-CM

## 2012-04-09 LAB — CBC WITH DIFFERENTIAL/PLATELET
Basophils Absolute: 0 10*3/uL (ref 0.0–0.1)
Basophils Relative: 0 % (ref 0–1)
Eosinophils Absolute: 0.3 10*3/uL (ref 0.0–0.7)
Eosinophils Relative: 4 % (ref 0–5)
HCT: 37.4 % (ref 36.0–46.0)
Hemoglobin: 12.4 g/dL (ref 12.0–15.0)
Lymphocytes Relative: 37 % (ref 12–46)
Lymphs Abs: 2.5 10*3/uL (ref 0.7–4.0)
MCH: 30.2 pg (ref 26.0–34.0)
MCHC: 33.2 g/dL (ref 30.0–36.0)
MCV: 91.2 fL (ref 78.0–100.0)
Monocytes Absolute: 0.4 10*3/uL (ref 0.1–1.0)
Monocytes Relative: 6 % (ref 3–12)
Neutro Abs: 3.6 10*3/uL (ref 1.7–7.7)
Neutrophils Relative %: 53 % (ref 43–77)
Platelets: 392 10*3/uL (ref 150–400)
RBC: 4.1 MIL/uL (ref 3.87–5.11)
RDW: 13.5 % (ref 11.5–15.5)
WBC: 6.8 10*3/uL (ref 4.0–10.5)

## 2012-04-09 LAB — COMPREHENSIVE METABOLIC PANEL
ALT: 11 U/L (ref 0–35)
AST: 13 U/L (ref 0–37)
Albumin: 4.4 g/dL (ref 3.5–5.2)
Alkaline Phosphatase: 56 U/L (ref 39–117)
BUN: 41 mg/dL — ABNORMAL HIGH (ref 6–23)
CO2: 26 mEq/L (ref 19–32)
Calcium: 9.9 mg/dL (ref 8.4–10.5)
Chloride: 102 mEq/L (ref 96–112)
Creat: 1.31 mg/dL — ABNORMAL HIGH (ref 0.50–1.10)
Glucose, Bld: 102 mg/dL — ABNORMAL HIGH (ref 70–99)
Potassium: 4.5 mEq/L (ref 3.5–5.3)
Sodium: 136 mEq/L (ref 135–145)
Total Bilirubin: 0.5 mg/dL (ref 0.3–1.2)
Total Protein: 7.4 g/dL (ref 6.0–8.3)

## 2012-04-09 LAB — LIPID PANEL
Cholesterol: 233 mg/dL — ABNORMAL HIGH (ref 0–200)
HDL: 78 mg/dL (ref >39)
LDL Cholesterol: 139 mg/dL — ABNORMAL HIGH (ref 0–99)
Total CHOL/HDL Ratio: 3 Ratio
Triglycerides: 78 mg/dL (ref <150)
VLDL: 16 mg/dL (ref 0–40)

## 2012-04-09 LAB — TSH: TSH: 2.745 u[IU]/mL (ref 0.350–4.500)

## 2012-04-09 MED ORDER — METOPROLOL SUCCINATE ER 25 MG PO TB24: 25.0000 mg | ORAL_TABLET | Freq: Every day | ORAL | Status: DC

## 2012-04-09 MED ORDER — LISINOPRIL-HYDROCHLOROTHIAZIDE 20-12.5 MG PO TABS: 1.0000 | ORAL_TABLET | Freq: Every day | ORAL | Status: DC

## 2012-04-09 NOTE — Patient Instructions (Signed)
Restart zyrtec each day for allergies. Continue efforts with diet and exercise for continued weight loss. Continue same blood pressure medicines and recheck in 6 months.  Your should receive a call or letter about your lab results within the next week to 10 days.  Keeping You Healthy  Get These Tests  Blood Pressure- Have your blood pressure checked by your healthcare provider at least once a year.  Normal blood pressure is 120/80.  Weight- Have your body mass index (BMI) calculated to screen for obesity.  BMI is a measure of body fat based on height and weight.  You can calculate your own BMI at [employer]  Cholesterol- Have your cholesterol checked every year.  Diabetes- Have your blood sugar checked every year if you have high blood pressure, high cholesterol, a family history of diabetes or if you are overweight.  Pap Smear- Have a pap smear every 1 to 3 years if you have been sexually active.  If you are older than 65 and recent pap smears have been normal you may not need additional pap smears.  In addition, if you have had a hysterectomy  For benign disease additional pap smears are not necessary.  Mammogram-Yearly mammograms are essential for early detection of breast cancer  Screening for Colon Cancer- Colonoscopy starting at age 84. Screening may begin sooner depending on your family history and other health conditions.  Follow up colonoscopy as directed by your Gastroenterologist.  Screening for Osteoporosis- Screening begins at age 52 with bone density scanning, sooner if you are at higher risk for developing Osteoporosis.  Get these medicines  Calcium with Vitamin D- Your body requires 1200-1500 mg of Calcium a day and [phone] IU of Vitamin D a day.  You can only absorb 500 mg of Calcium at a time therefore Calcium must be taken in 2 or 3 separate doses throughout the day.  Hormones- Hormone therapy has been associated with increased risk for certain cancers and  heart disease.  Talk to your healthcare provider about if you need relief from menopausal symptoms.  Aspirin- Ask your healthcare provider about taking Aspirin to prevent Heart Disease and Stroke.  Get these Immuniztions  Flu shot- Every fall  Pneumonia shot- Once after the age of 68; if you are younger ask your healthcare provider if you need a pneumonia shot.  Tetanus- Every ten years.  Zostavax- Once after the age of 25 to prevent shingles.  Take these steps  Don't smoke- Your healthcare provider can help you quit. For tips on how to quit, ask your healthcare provider or go to www.smokefree.gov or call 1-800 QUIT-NOW.  Be physically active- Exercise 5 days a week for a minimum of 30 minutes.  If you are not already physically active, start slow and gradually work up to 30 minutes of moderate physical activity.  Try walking, dancing, bike riding, swimming, etc.  Eat a healthy diet- Eat a variety of healthy foods such as fruits, vegetables, whole grains, low fat milk, low fat cheeses, yogurt, lean meats, chicken, fish, eggs, dried beans, tofu, etc.  For more information go to www.thenutritionsource.org  Dental visit- Brush and floss teeth twice daily; visit your dentist twice a year.  Eye exam- Visit your Optometrist or Ophthalmologist yearly.  Drink alcohol in moderation- Limit alcohol intake to one drink or less a day.  Never drink and drive.  Depression- Your emotional health is as important as your physical health.  If you're feeling down or losing interest in things you normally  enjoy, please talk to your healthcare provider.  Seat Belts- can save your life; always wear one  Smoke/Carbon Monoxide detectors- These detectors need to be installed on the appropriate level of your home.  Replace batteries at least once a year.  Violence- If anyone is threatening or hurting you, please tell your healthcare provider.  Living Will/ Health care power of attorney- Discuss with your  healthcare provider and family.

## 2012-04-09 NOTE — Progress Notes (Signed)
  Subjective:    Patient ID: Linda Allen, female    DOB: [birth year], 67 y.o.   MRN: [id number]  HPI    Review of Systems  Eyes: Positive for itching.       Objective:   Physical Exam        Assessment & Plan:

## 2012-04-09 NOTE — Progress Notes (Signed)
  Subjective:    Patient ID: Linda Allen, female    DOB: 1945-04-26, 67 y.o.   MRN: [id number]  HPI    Review of Systems  Constitutional: Negative.   HENT: Negative.   Eyes: Positive for pain.  Respiratory: Negative.   Cardiovascular: Negative.   Gastrointestinal: Negative.   Endocrine: Negative.   Musculoskeletal: Negative.   Skin: Negative.   Allergic/Immunologic: Negative.   Neurological: Negative.   Hematological: Negative.   Psychiatric/Behavioral: Negative.        Objective:   Physical Exam        Assessment & Plan:

## 2012-04-09 NOTE — Progress Notes (Signed)
Subjective:    Patient ID: Linda Allen, female    DOB: 1945-08-21, 67 y.o.   MRN: [id number]  HPI Linda Allen is a 67 y.o. female Hx of HTN, overweight/obesity. Here for CPE.   Mammogram 02/16/12: Birads 2 - no significant abnormality or change. Bone density ok last year.  Colonoscopy: approx 3 years ago - due every 5 - Dr. Collene Mares.  Flu vaccine: January of this year.  Last pap few years ago - normal then. Discussed repeat testing todayDentist 2 weeks ago. Optho - December 2013.  Last tetanus past 2 years. Zostavax last year - arm swelling, sore, red.   HTN - prior on  lisinopril 20mg  qd, toprol 25mg  qd, but elevated at 10/13 ov, so added 12.5mg  hctz to lisinopril. Outside BP's: 120'70's.    Results for orders placed in visit on [id number]  BASIC METABOLIC PANEL      Result Value Range   Sodium 139  135 - 145 mEq/L   Potassium 4.8  3.5 - 5.3 mEq/L   Chloride 106  96 - 112 mEq/L   CO2 26  19 - 32 mEq/L   Glucose, Bld 102 (*) 70 - 99 mg/dL   BUN 29 (*) 6 - 23 mg/dL   Creat 1.14 (*) 0.50 - 1.10 mg/dL   Calcium 9.8  8.4 - 10.5 mg/dL  creat 0.92 in 2012.   Heart murmur - followed by cardiology. 12/31/09 echo:Wall thickness was increased in a pattern of mild LVH. Systolic function was normal.The estimated ejection fraction was in the range of 55% to 60%. Wall motion was normal; there were no regional wall motion abnormalities. Doppler parameters are consistent with abnormal left ventricular relaxation (grade 1 diastolic dysfunction). Cards: Johnsie Cancel - next appt, in December.  Obesity - weight 306 at 11/05/10 office visit, 320 at 10/13 OV.  Discussed avoidance of guilty foods and other coping mechanisms at last ov, nutritionist declined. Has stopped alcohol, improved diet past few months. Cutting back on carbs. Walking 30 minutes 3 times per week.  Plans on increase and biking.  20 pounds off based on home scale.  Weight 311 today.   Allergic rhinitis - zyrtec in past. On and off  use. Slight fatigue once with this. Still has nasal spray if needed.    No other acute concerns.   Fasting since 8pm.   Review of Systems  Constitutional: Negative for fatigue and unexpected weight change.  Respiratory: Negative for chest tightness and shortness of breath.   Cardiovascular: Negative for chest pain, palpitations and leg swelling.  Gastrointestinal: Negative for abdominal pain and blood in stool.  Neurological: Negative for dizziness, syncope, light-headedness and headaches.  All other systems reviewed and are negative.       Objective:   Physical Exam  Vitals reviewed. Constitutional: She is oriented to person, place, and time. She appears well-developed and well-nourished.  overweight  HENT:  Head: Normocephalic and atraumatic.  Right Ear: External ear normal.  Left Ear: External ear normal.  Mouth/Throat: Oropharynx is clear and moist.  Eyes: Conjunctivae are normal. Pupils are equal, round, and reactive to light.  Neck: Normal range of motion. Neck supple. No thyromegaly present.  Cardiovascular: Normal rate, regular rhythm, normal heart sounds and intact distal pulses.   No murmur heard. Pulmonary/Chest: Effort normal and breath sounds normal. No respiratory distress. She has no wheezes.  Abdominal: Soft. Bowel sounds are normal. There is no tenderness.  Musculoskeletal: Normal range of motion. She exhibits no edema and  no tenderness.  Lymphadenopathy:    She has no cervical adenopathy.  Neurological: She is alert and oriented to person, place, and time.  Skin: Skin is warm and dry. No rash noted.  Psychiatric: She has a normal mood and affect. Her behavior is normal. Thought content normal.        Assessment & Plan:  Linda Allen is a 67 y.o. female Routine general medical examination at a health care facility - Plan: IFOBT POC (occult bld, rslt in office), CBC with Differential, Comprehensive metabolic panel, Lipid panel, TSH  Unspecified  essential hypertension - Plan: IFOBT POC (occult bld, rslt in office), CBC with Differential, Comprehensive metabolic panel, Lipid panel, TSH  Obesity, unspecified - Plan: IFOBT POC (occult bld, rslt in office), CBC with Differential, Comprehensive metabolic panel, Lipid panel, TSH  Heart murmur - Plan: IFOBT POC (occult bld, rslt in office), CBC with Differential, Comprehensive metabolic panel, Lipid panel, TSH  HTN (hypertension) - Plan: metoprolol succinate (TOPROL-XL) 25 MG 24 hr tablet, lisinopril-hydrochlorothiazide (ZESTORETIC) 20-12.5 MG per tablet  Allergic rhinitis  CPE: Anticipatory guidance given.  utd on td, flu and zoster vaccination. Declined pap testing -  declined and aware of risk of not detecting abnormality or cervical cancer.  HTN - improved - meds refilled bleow. Labs above pending.   Obesity - commended on wt loss and recommended continued diet and exercise.  Call if nutritionist eval needed.  recehck in 6 months.  Prior borderline hyperglycemia - recheck in 6 months.   Heart murmur - stable - follow up with cards as planned.   Allergic rhinitis - restart Zyrtec qd, has NS if needed. rtc precautions.   Anticipatory guidance - has living will, full code, but would not want to be on ventilator for prolonged time. organ donor.   Meds ordered this encounter  Medications  . metoprolol succinate (TOPROL-XL) 25 MG 24 hr tablet    Sig: Take 1 tablet (25 mg total) by mouth daily.    Dispense:  90 tablet    Refill:  1  . lisinopril-hydrochlorothiazide (ZESTORETIC) 20-12.5 MG per tablet    Sig: Take 1 tablet by mouth daily.    Dispense:  90 tablet    Refill:  1   Patient Instructions  Restart zyrtec each day for allergies. Continue efforts with diet and exercise for continued weight loss. Continue same blood pressure medicines and recheck in 6 months.  Your should receive a call or letter about your lab results within the next week to 10 days.  Keeping You  Healthy  Get These Tests  Blood Pressure- Have your blood pressure checked by your healthcare provider at least once a year.  Normal blood pressure is 120/80.  Weight- Have your body mass index (BMI) calculated to screen for obesity.  BMI is a measure of body fat based on height and weight.  You can calculate your own BMI at [employer]  Cholesterol- Have your cholesterol checked every year.  Diabetes- Have your blood sugar checked every year if you have high blood pressure, high cholesterol, a family history of diabetes or if you are overweight.  Pap Smear- Have a pap smear every 1 to 3 years if you have been sexually active.  If you are older than 65 and recent pap smears have been normal you may not need additional pap smears.  In addition, if you have had a hysterectomy  For benign disease additional pap smears are not necessary.  Mammogram-Yearly mammograms are essential for early  detection of breast cancer  Screening for Colon Cancer- Colonoscopy starting at age 50. Screening may begin sooner depending on your family history and other health conditions.  Follow up colonoscopy as directed by your Gastroenterologist.  Screening for Osteoporosis- Screening begins at age 15 with bone density scanning, sooner if you are at higher risk for developing Osteoporosis.  Get these medicines  Calcium with Vitamin D- Your body requires 1200-1500 mg of Calcium a day and [phone] IU of Vitamin D a day.  You can only absorb 500 mg of Calcium at a time therefore Calcium must be taken in 2 or 3 separate doses throughout the day.  Hormones- Hormone therapy has been associated with increased risk for certain cancers and heart disease.  Talk to your healthcare provider about if you need relief from menopausal symptoms.  Aspirin- Ask your healthcare provider about taking Aspirin to prevent Heart Disease and Stroke.  Get these Immuniztions  Flu shot- Every fall  Pneumonia shot- Once after  the age of 64; if you are younger ask your healthcare provider if you need a pneumonia shot.  Tetanus- Every ten years.  Zostavax- Once after the age of 78 to prevent shingles.  Take these steps  Don't smoke- Your healthcare provider can help you quit. For tips on how to quit, ask your healthcare provider or go to www.smokefree.gov or call 1-800 QUIT-NOW.  Be physically active- Exercise 5 days a week for a minimum of 30 minutes.  If you are not already physically active, start slow and gradually work up to 30 minutes of moderate physical activity.  Try walking, dancing, bike riding, swimming, etc.  Eat a healthy diet- Eat a variety of healthy foods such as fruits, vegetables, whole grains, low fat milk, low fat cheeses, yogurt, lean meats, chicken, fish, eggs, dried beans, tofu, etc.  For more information go to www.thenutritionsource.org  Dental visit- Brush and floss teeth twice daily; visit your dentist twice a year.  Eye exam- Visit your Optometrist or Ophthalmologist yearly.  Drink alcohol in moderation- Limit alcohol intake to one drink or less a day.  Never drink and drive.  Depression- Your emotional health is as important as your physical health.  If you're feeling down or losing interest in things you normally enjoy, please talk to your healthcare provider.  Seat Belts- can save your life; always wear one  Smoke/Carbon Monoxide detectors- These detectors need to be installed on the appropriate level of your home.  Replace batteries at least once a year.  Violence- If anyone is threatening or hurting you, please tell your healthcare provider.  Living Will/ Health care power of attorney- Discuss with your healthcare provider and family.

## 2012-04-11 ENCOUNTER — Other Ambulatory Visit: Payer: Self-pay | Admitting: Family Medicine

## 2012-04-16 ENCOUNTER — Other Ambulatory Visit: Payer: Self-pay | Admitting: Family Medicine

## 2012-04-16 DIAGNOSIS — R7989 Other specified abnormal findings of blood chemistry: Secondary | ICD-10-CM

## 2012-05-15 ENCOUNTER — Encounter: Payer: Self-pay | Admitting: Family Medicine

## 2012-10-15 ENCOUNTER — Ambulatory Visit (INDEPENDENT_AMBULATORY_CARE_PROVIDER_SITE_OTHER): Payer: BC Managed Care – PPO | Admitting: Family Medicine

## 2012-10-15 ENCOUNTER — Encounter: Payer: Self-pay | Admitting: Family Medicine

## 2012-10-15 VITALS — BP 122/74 | HR 71 | Temp 98.7°F | Resp 18 | Ht 68.0 in | Wt 315.8 lb

## 2012-10-15 DIAGNOSIS — R799 Abnormal finding of blood chemistry, unspecified: Secondary | ICD-10-CM

## 2012-10-15 DIAGNOSIS — Z23 Encounter for immunization: Secondary | ICD-10-CM

## 2012-10-15 DIAGNOSIS — R7989 Other specified abnormal findings of blood chemistry: Secondary | ICD-10-CM

## 2012-10-15 DIAGNOSIS — I10 Essential (primary) hypertension: Secondary | ICD-10-CM

## 2012-10-15 DIAGNOSIS — S0120XA Unspecified open wound of nose, initial encounter: Secondary | ICD-10-CM

## 2012-10-15 DIAGNOSIS — E785 Hyperlipidemia, unspecified: Secondary | ICD-10-CM

## 2012-10-15 DIAGNOSIS — E669 Obesity, unspecified: Secondary | ICD-10-CM

## 2012-10-15 LAB — HEMOGLOBIN A1C
Hgb A1c MFr Bld: 6.3 % — ABNORMAL HIGH (ref <5.7)
Mean Plasma Glucose: 134 mg/dL — ABNORMAL HIGH (ref <117)

## 2012-10-15 MED ORDER — MUPIROCIN 2 % EX OINT: TOPICAL_OINTMENT | Freq: Two times a day (BID) | CUTANEOUS | Status: DC

## 2012-10-15 MED ORDER — METOPROLOL SUCCINATE ER 25 MG PO TB24: 25.0000 mg | ORAL_TABLET | Freq: Every day | ORAL | Status: DC

## 2012-10-15 MED ORDER — LISINOPRIL-HYDROCHLOROTHIAZIDE 20-12.5 MG PO TABS: 1.0000 | ORAL_TABLET | Freq: Every day | ORAL | Status: DC

## 2012-10-15 NOTE — Patient Instructions (Signed)
Bactroban to nose as discussed, if persists - can follow up with ENT.  Avoidance of stress eating - try to find another option for coping when stressed.  You should receive a call or letter about your lab results within the next week to 10 days.  Return to the clinic or go to the nearest emergency room if any of your symptoms worsen or new symptoms occur. Can follow up in 1 month as discussed.

## 2012-10-15 NOTE — Progress Notes (Signed)
Subjective:    Patient ID: Linda Allen, female    DOB: Feb 16, 1945, 67 y.o.   MRN: [id number]  HPI Linda Allen is a 67 y.o. female  Here for follow up.  HTN -  Takes Toprol XL 25mg  qd, zestoretic 20/12.5mg  qd.  Creatinine slightly elevated at last ov of 1.31.plan to drink plenty of water/fluids and recheck lab only visit in next 2 weeks to see if the creatinine improves, but has not had repeat lab testing. Home BP's: 120's/70's. No chest pain, dizziness, no new cough, no other new side effects, urinating normally.   Hyperlipidemia - tc 233, trig 78, hdl 78, ldl 139 in March. Plan for diet changes, wt loss, and recheck in 6 months. Weight increased form 311 to 315 today. Hx of obesity, offered nutrition eval last ov - declined. Had been losing weight with diet and walking every day.  Was down to 290, then stress snacking with cookies/Oreos. Knows what to do, just slips up. Still declines nutrition eval. Stopped drinking alcohol. Quit smoking cold [place]. Would like to come back once per month, as feels this helps keep weight down. Fasting today. Last po last night.   Hyperglycemia - glucose 102 in March. Discussed prediabetes, diet control and wt loss. Weight up as above.   Allergic rhinitis -  On zyrtec 10mg  qd.  Feels like nose gets sores/pimples at times, dried crusting at times. Irritated at times. Persistent all summer. Sore and swollen, but less at present time.  Tx: vaseline, occasional neosporin. Has seen Dr. Janace Hoard (ENT) in past for other issues.   Labs from March:   Results for orders placed in visit on 04/09/12  CBC WITH DIFFERENTIAL      Result Value Range   WBC 6.8  4.0 - 10.5 K/uL   RBC 4.10  3.87 - 5.11 MIL/uL   Hemoglobin 12.4  12.0 - 15.0 g/dL   HCT 37.4  36.0 - 46.0 %   MCV 91.2  78.0 - 100.0 fL   MCH 30.2  26.0 - 34.0 pg   MCHC 33.2  30.0 - 36.0 g/dL   RDW 13.5  11.5 - 15.5 %   Platelets 392  150 - 400 K/uL   Neutrophils Relative % 53  43 - 77 %   Neutro Abs 3.6  1.7 - 7.7 K/uL   Lymphocytes Relative 37  12 - 46 %   Lymphs Abs 2.5  0.7 - 4.0 K/uL   Monocytes Relative 6  3 - 12 %   Monocytes Absolute 0.4  0.1 - 1.0 K/uL   Eosinophils Relative 4  0 - 5 %   Eosinophils Absolute 0.3  0.0 - 0.7 K/uL   Basophils Relative 0  0 - 1 %   Basophils Absolute 0.0  0.0 - 0.1 K/uL   Smear Review Criteria for review not met    COMPREHENSIVE METABOLIC PANEL      Result Value Range   Sodium 136  135 - 145 mEq/L   Potassium 4.5  3.5 - 5.3 mEq/L   Chloride 102  96 - 112 mEq/L   CO2 26  19 - 32 mEq/L   Glucose, Bld 102 (*) 70 - 99 mg/dL   BUN 41 (*) 6 - 23 mg/dL   Creat 1.31 (*) 0.50 - 1.10 mg/dL   Total Bilirubin 0.5  0.3 - 1.2 mg/dL   Alkaline Phosphatase 56  39 - 117 U/L   AST 13  0 - 37 U/L   ALT  11  0 - 35 U/L   Total Protein 7.4  6.0 - 8.3 g/dL   Albumin 4.4  3.5 - 5.2 g/dL   Calcium 9.9  8.4 - 10.5 mg/dL  LIPID PANEL      Result Value Range   Cholesterol 233 (*) 0 - 200 mg/dL   Triglycerides 78  <150 mg/dL   HDL 78  >39 mg/dL   Total CHOL/HDL Ratio 3.0     VLDL 16  0 - 40 mg/dL   LDL Cholesterol 139 (*) 0 - 99 mg/dL  TSH      Result Value Range   TSH 2.745  0.350 - 4.500 uIU/mL      Review of Systems  Constitutional: Negative for fatigue and unexpected weight change.  Respiratory: Negative for chest tightness and shortness of breath.   Cardiovascular: Negative for chest pain, palpitations and leg swelling.  Gastrointestinal: Negative for abdominal pain and blood in stool.  Genitourinary: Negative for decreased urine volume.  Neurological: Negative for dizziness, syncope, light-headedness and headaches.       Objective:   Physical Exam  Vitals reviewed. Constitutional: She is oriented to person, place, and time. She appears well-developed and well-nourished.  Overweight/obese.   HENT:  Head: Normocephalic and atraumatic.  Few raw appearing areas distal nares, no active bleeding, no sts. No pustules.   Eyes:  Conjunctivae and EOM are normal. Pupils are equal, round, and reactive to light.  Neck: Carotid bruit is not present.  Cardiovascular: Normal rate, regular rhythm, normal heart sounds and intact distal pulses.   Pulmonary/Chest: Effort normal and breath sounds normal.  Abdominal: Soft. She exhibits no pulsatile midline mass. There is no tenderness.  Neurological: She is alert and oriented to person, place, and time.  Skin: Skin is warm and dry.  Psychiatric: She has a normal mood and affect. Her behavior is normal.       Assessment & Plan:   Linda Allen is a 67 y.o. female Need for prophylactic vaccination and inoculation against influenza - Plan: Flu Vaccine QUAD 36+ mos IM  Wound, open, nasal cavity, initial encounter - Plan: mupirocin ointment (BACTROBAN) 2 % - for 1 week., then vaseline.  If persists - follow up with ENT.  HTN (hypertension) - Plan: metoprolol succinate (TOPROL-XL) 25 MG 24 hr tablet, lisinopril-hydrochlorothiazide (ZESTORETIC) 20-12.5 MG per tablet, Comprehensive metabolic panel, Lipid panel.  Controlled, no change in meds.   Hyperglycemia - Hemoglobin A1c pending.   Other and unspecified hyperlipidemia - Plan: Lipid panel.  Discussed wt loss. Lipids pending.   Elevated serum creatinine - recheck CMP.   Obesity - discussed avoidance of stress eating, working on weight loss through diet and exercise. Recheck in 1 month. Declined nutrition eval, but also name given for bariatric medicine - Dr. Valetta Close if she would like.   Flu vaccine given today.   Meds ordered this encounter  Medications  . mupirocin ointment (BACTROBAN) 2 %    Sig: Apply topically 2 (two) times daily. To inside of nares for 1 week.    Dispense:  22 g    Refill:  0  . metoprolol succinate (TOPROL-XL) 25 MG 24 hr tablet    Sig: Take 1 tablet (25 mg total) by mouth daily.    Dispense:  90 tablet    Refill:  1  . lisinopril-hydrochlorothiazide (ZESTORETIC) 20-12.5 MG per tablet    Sig:  Take 1 tablet by mouth daily.    Dispense:  90 tablet    Refill:  1   Patient Instructions  Bactroban to nose as discussed, if persists - can follow up with ENT.  Avoidance of stress eating - try to find another option for coping when stressed.  You should receive a call or letter about your lab results within the next week to 10 days.  Return to the clinic or go to the nearest emergency room if any of your symptoms worsen or new symptoms occur. Can follow up in 1 month as discussed.

## 2012-10-16 LAB — COMPREHENSIVE METABOLIC PANEL
ALT: 12 U/L (ref 0–35)
AST: 15 U/L (ref 0–37)
Albumin: 4.2 g/dL (ref 3.5–5.2)
Alkaline Phosphatase: 55 U/L (ref 39–117)
BUN: 38 mg/dL — ABNORMAL HIGH (ref 6–23)
CO2: 25 mEq/L (ref 19–32)
Calcium: 9.8 mg/dL (ref 8.4–10.5)
Chloride: 102 mEq/L (ref 96–112)
Creat: 1.35 mg/dL — ABNORMAL HIGH (ref 0.50–1.10)
Glucose, Bld: 90 mg/dL (ref 70–99)
Potassium: 4.2 mEq/L (ref 3.5–5.3)
Sodium: 136 mEq/L (ref 135–145)
Total Bilirubin: 0.6 mg/dL (ref 0.3–1.2)
Total Protein: 7.3 g/dL (ref 6.0–8.3)

## 2012-10-16 LAB — LIPID PANEL
Cholesterol: 219 mg/dL — ABNORMAL HIGH (ref 0–200)
HDL: 69 mg/dL (ref >39)
LDL Cholesterol: 131 mg/dL — ABNORMAL HIGH (ref 0–99)
Total CHOL/HDL Ratio: 3.2 Ratio
Triglycerides: 97 mg/dL (ref <150)
VLDL: 19 mg/dL (ref 0–40)

## 2012-11-19 ENCOUNTER — Encounter: Payer: Self-pay | Admitting: Family Medicine

## 2012-11-19 ENCOUNTER — Ambulatory Visit (INDEPENDENT_AMBULATORY_CARE_PROVIDER_SITE_OTHER): Payer: BC Managed Care – PPO | Admitting: Family Medicine

## 2012-11-19 VITALS — BP 118/64 | HR 59 | Temp 98.2°F | Resp 16 | Ht 67.75 in | Wt 310.4 lb

## 2012-11-19 DIAGNOSIS — E669 Obesity, unspecified: Secondary | ICD-10-CM

## 2012-11-19 DIAGNOSIS — R7989 Other specified abnormal findings of blood chemistry: Secondary | ICD-10-CM

## 2012-11-19 DIAGNOSIS — R799 Abnormal finding of blood chemistry, unspecified: Secondary | ICD-10-CM

## 2012-11-19 LAB — BASIC METABOLIC PANEL
BUN: 45 mg/dL — ABNORMAL HIGH (ref 6–23)
CO2: 23 mEq/L (ref 19–32)
Calcium: 9.3 mg/dL (ref 8.4–10.5)
Chloride: 99 mEq/L (ref 96–112)
Creat: 1.71 mg/dL — ABNORMAL HIGH (ref 0.50–1.10)
Glucose, Bld: 87 mg/dL (ref 70–99)
Potassium: 4.7 mEq/L (ref 3.5–5.3)
Sodium: 134 mEq/L — ABNORMAL LOW (ref 135–145)

## 2012-11-19 NOTE — Progress Notes (Signed)
Subjective:    Patient ID: Linda Allen, female    DOB: 05-04-1945, 67 y.o.   MRN: [id number]  HPI Linda Allen is a 67 y.o. female Here for follow up.  Last seen one month ago. Weight had increased from 311 to 315 then. Had been down to 290 in past with diet and walking. Stress snacking with cookies/Oreos then. Discussed nutriution eval and name given for provider specializing in weight related medicine.   Results for orders placed in visit on 10/15/12  COMPREHENSIVE METABOLIC PANEL      Result Value Range   Sodium 136  135 - 145 mEq/L   Potassium 4.2  3.5 - 5.3 mEq/L   Chloride 102  96 - 112 mEq/L   CO2 25  19 - 32 mEq/L   Glucose, Bld 90  70 - 99 mg/dL   BUN 38 (*) 6 - 23 mg/dL   Creat 1.35 (*) 0.50 - 1.10 mg/dL   Total Bilirubin 0.6  0.3 - 1.2 mg/dL   Alkaline Phosphatase 55  39 - 117 U/L   AST 15  0 - 37 U/L   ALT 12  0 - 35 U/L   Total Protein 7.3  6.0 - 8.3 g/dL   Albumin 4.2  3.5 - 5.2 g/dL   Calcium 9.8  8.4 - 10.5 mg/dL  LIPID PANEL      Result Value Range   Cholesterol 219 (*) 0 - 200 mg/dL   Triglycerides 97  <150 mg/dL   HDL 69  >39 mg/dL   Total CHOL/HDL Ratio 3.2     VLDL 19  0 - 40 mg/dL   LDL Cholesterol 131 (*) 0 - 99 mg/dL  HEMOGLOBIN A1C      Result Value Range   Hemoglobin A1C 6.3 (*) <5.7 %   Mean Plasma Glucose 134 (*) <117 mg/dL    Has cut out bread, pasta, rice, deceased carbs and stopped stress snacking. Breathing techniques when stressed, but less stressed. Walking 3 times per week. Weight 310 today. Feels better back into routine. Has talked to kids and clients. Plans on continuing regimen.   Elevated creatinine - 1.35 at last ov. Drinking plenty of water with exercise.   Nasal irritation resolved with ointment.   Review of Systems  Constitutional: Negative for unexpected weight change.  HENT: Negative for nosebleeds.   Respiratory: Negative for shortness of breath.   Cardiovascular: Negative for chest pain and palpitations.   Genitourinary: Negative for decreased urine volume and difficulty urinating.  Neurological: Negative for dizziness and light-headedness.   No lighheadedness, dizziness.     Objective:   Physical Exam  Vitals reviewed. Constitutional: She is oriented to person, place, and time. She appears well-developed and well-nourished.  HENT:  Head: Normocephalic and atraumatic.  Eyes: Conjunctivae and EOM are normal. Pupils are equal, round, and reactive to light.  Neck: Carotid bruit is not present.  Cardiovascular: Normal rate, regular rhythm and intact distal pulses.   Faint systolic murmur - chronic per pt.   Pulmonary/Chest: Effort normal and breath sounds normal.  Abdominal: Soft. She exhibits no pulsatile midline mass. There is no tenderness.  Neurological: She is alert and oriented to person, place, and time.  Skin: Skin is warm and dry.  Psychiatric: She has a normal mood and affect. Her behavior is normal.      Assessment & Plan:   Linda Allen is a 67 y.o. female Elevated serum creatinine - Plan: Basic metabolic panel to recheck, maintain  hydration.   Obesity - improved weight and commended on plan. Pre diabetic on Aic.  Continue diet changes, incr exercise to 5 d/week. Recheck in 1 month. Continue coping techniques other than snack eating when stressed.     Meds ordered this encounter  Medications  . aspirin 81 MG tablet    Sig: Take 81 mg by mouth daily.   Patient Instructions  Continue the great work on diet changes and weight loss.  Increase to 5 days pre week of exercise and recheck in next 1 month.  You should receive a call or letter about your lab results within the next week to 10 days.

## 2012-11-19 NOTE — Patient Instructions (Signed)
Continue the great work on diet changes and weight loss.  Increase to 5 days pre week of exercise and recheck in next 1 month.  You should receive a call or letter about your lab results within the next week to 10 days.

## 2012-11-24 ENCOUNTER — Other Ambulatory Visit: Payer: Self-pay | Admitting: Family Medicine

## 2012-11-24 DIAGNOSIS — I10 Essential (primary) hypertension: Secondary | ICD-10-CM

## 2012-11-24 MED ORDER — HYDROCHLOROTHIAZIDE 25 MG PO TABS: 25.0000 mg | ORAL_TABLET | Freq: Every day | ORAL | Status: DC

## 2012-11-27 ENCOUNTER — Encounter: Payer: Self-pay | Admitting: Family Medicine

## 2012-12-24 ENCOUNTER — Encounter: Payer: Self-pay | Admitting: Family Medicine

## 2012-12-24 ENCOUNTER — Ambulatory Visit (INDEPENDENT_AMBULATORY_CARE_PROVIDER_SITE_OTHER): Payer: BC Managed Care – PPO | Admitting: Family Medicine

## 2012-12-24 VITALS — BP 130/72 | HR 75 | Temp 97.3°F | Resp 16 | Ht 68.0 in | Wt 302.6 lb

## 2012-12-24 DIAGNOSIS — E663 Overweight: Secondary | ICD-10-CM

## 2012-12-24 DIAGNOSIS — R799 Abnormal finding of blood chemistry, unspecified: Secondary | ICD-10-CM

## 2012-12-24 DIAGNOSIS — R7989 Other specified abnormal findings of blood chemistry: Secondary | ICD-10-CM

## 2012-12-24 DIAGNOSIS — R7309 Other abnormal glucose: Secondary | ICD-10-CM

## 2012-12-24 DIAGNOSIS — I10 Essential (primary) hypertension: Secondary | ICD-10-CM

## 2012-12-24 DIAGNOSIS — R7303 Prediabetes: Secondary | ICD-10-CM

## 2012-12-24 LAB — BASIC METABOLIC PANEL
BUN: 33 mg/dL — ABNORMAL HIGH (ref 6–23)
CO2: 30 mEq/L (ref 19–32)
Calcium: 10 mg/dL (ref 8.4–10.5)
Chloride: 100 mEq/L (ref 96–112)
Creat: 1.48 mg/dL — ABNORMAL HIGH (ref 0.50–1.10)
Glucose, Bld: 110 mg/dL — ABNORMAL HIGH (ref 70–99)
Potassium: 4.1 mEq/L (ref 3.5–5.3)
Sodium: 140 mEq/L (ref 135–145)

## 2012-12-24 MED ORDER — HYDROCHLOROTHIAZIDE 12.5 MG PO TABS: 12.5000 mg | ORAL_TABLET | Freq: Every day | ORAL | Status: DC

## 2012-12-24 NOTE — Progress Notes (Signed)
Subjective:    Patient ID: Linda Allen, female    DOB: 1945/02/26, 67 y.o.   MRN: [id number]  HPI Linda Allen is a 67 y.o. female Here for follow up:  Obesity, prediabetes: Lab Results  Component Value Date   HGBA1C 6.3* 10/15/2012  weight up to 315 then. Had been down to 290 in past with diet and walking. Stress snacking in past. Weight 310 last ov. 302 today. Avoiding pasta, starches, sweets. Breathing exercises and drinking water instead of stress eating. Feels happier.  HTN - with mildly elevated creatinine recently. Up to 1.35 in October. Encouraged hydration, then further elevated to 1.71 on 11/19/12. Called with results and to hold lisinopril/hct combo. Started hctz 25mg  qd, continued toprol xl 25mg  qd.  Outside BP's: 95/60 lowest - did not feel bad.  100's-110's/60-70's. HR in 68-75 range.  Brought machine - correlating with our readings.      Chemistry      Component Value Date/Time   NA 134* 11/19/2012 1332   K 4.7 11/19/2012 1332   CL 99 11/19/2012 1332   CO2 23 11/19/2012 1332   BUN 45* 11/19/2012 1332   CREATININE 1.71* 11/19/2012 1332   CREATININE 0.85 05/28/2009 0400      Component Value Date/Time   CALCIUM 9.3 11/19/2012 1332   ALKPHOS 55 10/15/2012 1341   AST 15 10/15/2012 1341   ALT 12 10/15/2012 1341   BILITOT 0.6 10/15/2012 1341        Patient Active Problem List   Diagnosis Date Noted  . MORBID OBESITY 01/12/2010  . HYPERTENSION 12/22/2008  . MURMUR 12/22/2008   Past Medical History  Diagnosis Date  . Hypertension   . Allergy   . Arthritis    Past Surgical History  Procedure Laterality Date  . Joint replacement  2011    both knees   Allergies  Allergen Reactions  . Ivp Dye [Iodinated Diagnostic Agents]   . Penicillins Hives and Rash   Prior to Admission medications   Medication Sig Start Date End Date Taking? Authorizing Provider  aspirin 81 MG tablet Take 81 mg by mouth daily.    Historical Provider, MD  cetirizine  (ZYRTEC) 10 MG tablet Take 10 mg by mouth daily.    Historical Provider, MD  clindamycin (CLEOCIN) 300 MG capsule Take 300 mg by mouth 2 (two) times daily.    Historical Provider, MD  hydrochlorothiazide (HYDRODIURIL) 25 MG tablet Take 1 tablet (25 mg total) by mouth daily. 11/24/12   Wendie Agreste, MD  ibuprofen (ADVIL,MOTRIN) 200 MG tablet Take 200 mg by mouth every 6 (six) hours as needed. For pain    Historical Provider, MD  metoprolol succinate (TOPROL-XL) 25 MG 24 hr tablet Take 1 tablet (25 mg total) by mouth daily. 10/15/12   Wendie Agreste, MD  mupirocin ointment (BACTROBAN) 2 % Apply topically 2 (two) times daily. To inside of nares for 1 week. 10/15/12   Wendie Agreste, MD   History   Social History  . Marital Status: Married    Spouse Name: N/A    Number of Children: N/A  . Years of Education: N/A   Occupational History  . Not on file.   Social History Main Topics  . Smoking status: Former Research scientist (life sciences)  . Smokeless tobacco: Not on file  . Alcohol Use: Yes  . Drug Use: Not on file  . Sexual Activity: Yes     Comment: number of sex partners in the last 53 months  1   Other Topics Concern  . Not on file   Social History Narrative   Exercise walking 3 times per week for 30 minutes.      Anticipatory guidance - has living will, full code, but would not want to be on ventilator for prolonged time. organ donor.             Review of Systems  Constitutional: Positive for unexpected weight change (intentional weight change as above. ). Negative for fatigue.  Respiratory: Negative for chest tightness and shortness of breath.   Cardiovascular: Negative for chest pain, palpitations and leg swelling.  Gastrointestinal: Negative for abdominal pain and blood in stool.  Genitourinary: Negative for decreased urine volume and difficulty urinating.  Neurological: Negative for dizziness, syncope, light-headedness and headaches.       Objective:   Physical Exam  Vitals  reviewed. Constitutional: She is oriented to person, place, and time. She appears well-developed and well-nourished.  HENT:  Head: Normocephalic and atraumatic.  Eyes: Conjunctivae and EOM are normal. Pupils are equal, round, and reactive to light.  Neck: Carotid bruit is not present.  Cardiovascular: Normal rate, regular rhythm, normal heart sounds and intact distal pulses.   Pulmonary/Chest: Effort normal and breath sounds normal.  Abdominal: Soft. She exhibits no pulsatile midline mass. There is no tenderness.  Neurological: She is alert and oriented to person, place, and time.  Skin: Skin is warm and dry.  Psychiatric: She has a normal mood and affect. Her behavior is normal.   Filed Vitals:   12/24/12 1404  BP: 130/72  Pulse: 75  Temp: 97.3 F (36.3 C)  TempSrc: Oral  Resp: 16  Height: 5\' 8"  (1.727 m)  Weight: 302 lb 9.6 oz (137.258 kg)  SpO2: 97%      Assessment & Plan:   Linda Allen is a 67 y.o. female HTN (hypertension) - Plan: Basic metabolic panel, hydrochlorothiazide (HYDRODIURIL) 12.5 MG tablet - well controlled, even off lisinopril. Trial of further decrease to 12.5mg  HCTZ QD.  Elevated serum creatinine - Plan: Basic metabolic panel - recheck level off ACE-I. Possible nephrology eval if persistent elevation or increasing.   Overweight/obesity - improved with diet changes - commended on efforts and encouraged on continued wt loss.   Prediabetes - should improve with weight loss. Plan on A1c next ov in about a month.    Meds ordered this encounter  Medications  . hydrochlorothiazide (HYDRODIURIL) 12.5 MG tablet    Sig: Take 1 tablet (12.5 mg total) by mouth daily.    Dispense:  30 tablet    Refill:  1   Patient Instructions  Keep up the good work with weight loss.  You should receive a call or letter about your lab results within the next week to 10 days. IF kidney test is still elevated, we can talk about the next step.  Decrease dose to 12.5 mg of  HCTZ as discussed. Keep a record of your blood pressures outside of the office and bring them to the next office visit. Recheck in 1 month.

## 2012-12-24 NOTE — Patient Instructions (Signed)
Keep up the good work with weight loss.  You should receive a call or letter about your lab results within the next week to 10 days. IF kidney test is still elevated, we can talk about the next step.  Decrease dose to 12.5 mg of HCTZ as discussed. Keep a record of your blood pressures outside of the office and bring them to the next office visit. Recheck in 1 month.

## 2013-01-28 ENCOUNTER — Ambulatory Visit (INDEPENDENT_AMBULATORY_CARE_PROVIDER_SITE_OTHER): Payer: BC Managed Care – PPO | Admitting: Family Medicine

## 2013-01-28 ENCOUNTER — Encounter: Payer: Self-pay | Admitting: Family Medicine

## 2013-01-28 VITALS — BP 138/80 | HR 65 | Temp 97.7°F | Resp 18 | Ht 67.75 in | Wt 298.6 lb

## 2013-01-28 DIAGNOSIS — R7309 Other abnormal glucose: Secondary | ICD-10-CM

## 2013-01-28 DIAGNOSIS — I10 Essential (primary) hypertension: Secondary | ICD-10-CM

## 2013-01-28 DIAGNOSIS — R7303 Prediabetes: Secondary | ICD-10-CM

## 2013-01-28 DIAGNOSIS — R7989 Other specified abnormal findings of blood chemistry: Secondary | ICD-10-CM

## 2013-01-28 DIAGNOSIS — R799 Abnormal finding of blood chemistry, unspecified: Secondary | ICD-10-CM

## 2013-01-28 LAB — POCT GLYCOSYLATED HEMOGLOBIN (HGB A1C): Hemoglobin A1C: 5.5

## 2013-01-28 NOTE — Progress Notes (Addendum)
Subjective:    Patient ID: Linda Allen, female    DOB: 09/02/1945, 68 y.o.   MRN: [id number]  This chart was scribed for Wendie Agreste, MD by Maree Erie, ED Scribe. The patient was seen in room 22. Patient's care was started at 1:47 PM.  Chief Complaint  Patient presents with  . 5 week check up    weight  . lab work   PCP: Wendie Agreste, MD  HPI  Linda Allen is a 68 y.o. female who presents to office for follow up. Last office visit 12/24/12.  Obesity and pre diabetes: with hemoglobin A1C of 6.3. Weight up to 315lb, down to 302lb last office visit with change in diet and exercise. Weight today is 298 lb. She states she is not getting on the scale at home and has only been checking it when she comes in for office visits. She is walking for thirty minues three days a week but states she is going to add days. She is considering a fitness band to keep track of her daily steps. She states that she has had a decreased appetite lately but has still been eating.   Wt Readings from Last 3 Encounters:  01/28/13 298 lb 9.6 oz (135.444 kg)  12/24/12 302 lb 9.6 oz (137.258 kg)  11/19/12 310 lb 6.4 oz (140.797 kg)    Hypertension: with elevated creatinine up to 1.71 on 11/19/12. Lisinopril was discontinued and started just on HCTZ 25 mg QD, continued Toprol-XL 25 mg QD. Relatively low home BP last office visit. Decreased HCTZ to 12.5 mg. She states her BP at home has been running 114/72. She denies it has dropped any lower than that average.   Elevated Creatinine: high of 1.71 on 11/19/12 down to 1.48 on 12/15. Advised to avoid NSAIDs and maintain hydration.   Allergic rhinitis: She states she has been taking her Zyrtec without relief so she stopped taking it two days ago. She has tried Flonase in the past but is not on it currently. She is open to trying it again.   She denies chest pain, lightheadedness, dizziness, shortness of breath.    Patient Active Problem List     Diagnosis Date Noted  . MORBID OBESITY 01/12/2010  . HYPERTENSION 12/22/2008  . MURMUR 12/22/2008   Past Medical History  Diagnosis Date  . Hypertension   . Allergy   . Arthritis    Past Surgical History  Procedure Laterality Date  . Joint replacement  2011    both knees   Allergies  Allergen Reactions  . Ivp Dye [Iodinated Diagnostic Agents]   . Penicillins Hives and Rash   Prior to Admission medications   Medication Sig Start Date End Date Taking? Authorizing Provider  aspirin 81 MG tablet Take 81 mg by mouth daily.   Yes Historical Provider, MD  hydrochlorothiazide (HYDRODIURIL) 12.5 MG tablet Take 1 tablet (12.5 mg total) by mouth daily. 12/24/12  Yes Wendie Agreste, MD  metoprolol succinate (TOPROL-XL) 25 MG 24 hr tablet Take 1 tablet (25 mg total) by mouth daily. 10/15/12  Yes Wendie Agreste, MD  mupirocin ointment (BACTROBAN) 2 % Apply topically 2 (two) times daily. To inside of nares for 1 week. 10/15/12  Yes Wendie Agreste, MD  cetirizine (ZYRTEC) 10 MG tablet Take 10 mg by mouth daily.    Historical Provider, MD  ibuprofen (ADVIL,MOTRIN) 200 MG tablet Take 200 mg by mouth every 6 (six) hours as needed. For pain  Historical Provider, MD   History   Social History  . Marital Status: Married    Spouse Name: N/A    Number of Children: N/A  . Years of Education: N/A   Occupational History  . Not on file.   Social History Main Topics  . Smoking status: Former Research scientist (life sciences)  . Smokeless tobacco: Not on file  . Alcohol Use: Yes  . Drug Use: Not on file  . Sexual Activity: Yes     Comment: number of sex partners in the last 49 months  1   Other Topics Concern  . Not on file   Social History Narrative   Exercise walking 3 times per week for 30 minutes.      Anticipatory guidance - has living will, full code, but would not want to be on ventilator for prolonged time. organ donor.              Review of Systems  Constitutional: Positive for appetite  change.  Respiratory: Negative for shortness of breath.   Cardiovascular: Negative for chest pain.  Neurological: Negative for dizziness and light-headedness.       Objective:   Physical Exam  Vitals reviewed. Constitutional: She is oriented to person, place, and time. She appears well-developed and well-nourished. No distress.  HENT:  Head: Normocephalic and atraumatic.  Right Ear: Hearing, tympanic membrane, external ear and ear canal normal.  Left Ear: Hearing, tympanic membrane, external ear and ear canal normal.  Nose: Mucosal edema present.  Mouth/Throat: Oropharynx is clear and moist. No oropharyngeal exudate.  Minimal mucosal edema in turbinates bilaterally.   Eyes: Conjunctivae and EOM are normal. Pupils are equal, round, and reactive to light.  Neck: Carotid bruit is not present.  Cardiovascular: Normal rate, regular rhythm and intact distal pulses.   Murmur heard. Faint 1-2 systolic murmur at LUSB  Pulmonary/Chest: Effort normal and breath sounds normal. No respiratory distress. She has no wheezes. She has no rhonchi.  Abdominal: Soft. She exhibits no pulsatile midline mass. There is no tenderness.  Neurological: She is alert and oriented to person, place, and time.  Skin: Skin is warm and dry. No rash noted.  Psychiatric: She has a normal mood and affect. Her behavior is normal.    Filed Vitals:   01/28/13 1336  BP: 138/80  Pulse: 65  Temp: 97.7 F (36.5 C)  TempSrc: Oral  Resp: 18  Height: 5' 7.75" (1.721 m)  Weight: 298 lb 9.6 oz (135.444 kg)  SpO2: 100%   Results for orders placed in visit on 01/28/13  POCT GLYCOSYLATED HEMOGLOBIN (HGB A1C)      Result Value Range   Hemoglobin A1C 5.5         Assessment & Plan:   Linda Allen is a 68 y.o. female Pre-diabetes - Plan: POCT glycosylated hemoglobin (Hb A1C), obesity - commended on continued wt loss efforts and great results to glycemic control with lower A1c.  Continue diet, exercise -  17min/week.   Essential hypertension, benign - Plan: Basic metabolic panel.  Controlled on current regimen. Possible over treatment with drop in renal function prior.  Will cont to monitor, and plan on 1 month follow up.   Elevated serum creatinine - Plan: Basic metabolic panel rechecked. If persistently elevated, may need nephrology eval.   Allergic rhinitis - will try flonase again - rx provided.   Patient Instructions  Blood sugar test much improved. You should receive a call or letter about your lab results within the next  week to 10 days.  No new changes to meds.  recheck in 1 month to discuss weight as planned. Return to the clinic or go to the nearest emergency room if any of your symptoms worsen or new symptoms occur.   I personally performed the services described in this documentation, which was scribed in my presence. The recorded information has been reviewed and considered, and addended by me as needed.

## 2013-01-28 NOTE — Patient Instructions (Signed)
Blood sugar test much improved. You should receive a call or letter about your lab results within the next week to 10 days.  No new changes to meds.  recheck in 1 month to discuss weight as planned. Return to the clinic or go to the nearest emergency room if any of your symptoms worsen or new symptoms occur.

## 2013-01-29 LAB — BASIC METABOLIC PANEL
BUN: 29 mg/dL — ABNORMAL HIGH (ref 6–23)
CO2: 31 mEq/L (ref 19–32)
Calcium: 9.4 mg/dL (ref 8.4–10.5)
Chloride: 99 mEq/L (ref 96–112)
Creat: 1.26 mg/dL — ABNORMAL HIGH (ref 0.50–1.10)
Glucose, Bld: 98 mg/dL (ref 70–99)
Potassium: 4.1 mEq/L (ref 3.5–5.3)
Sodium: 139 mEq/L (ref 135–145)

## 2013-01-30 MED ORDER — FLUTICASONE PROPIONATE 50 MCG/ACT NA SUSP
2.0000 | Freq: Every day | NASAL | Status: DC
Start: 2013-01-30 — End: 2013-09-24

## 2013-03-04 ENCOUNTER — Ambulatory Visit (INDEPENDENT_AMBULATORY_CARE_PROVIDER_SITE_OTHER): Payer: BC Managed Care – PPO | Admitting: Family Medicine

## 2013-03-04 ENCOUNTER — Encounter: Payer: Self-pay | Admitting: Family Medicine

## 2013-03-04 VITALS — BP 124/64 | HR 62 | Temp 98.2°F | Resp 18 | Ht 67.75 in | Wt 296.4 lb

## 2013-03-04 DIAGNOSIS — Z131 Encounter for screening for diabetes mellitus: Secondary | ICD-10-CM

## 2013-03-04 DIAGNOSIS — R7309 Other abnormal glucose: Secondary | ICD-10-CM

## 2013-03-04 DIAGNOSIS — R7989 Other specified abnormal findings of blood chemistry: Secondary | ICD-10-CM

## 2013-03-04 DIAGNOSIS — R739 Hyperglycemia, unspecified: Secondary | ICD-10-CM

## 2013-03-04 DIAGNOSIS — I10 Essential (primary) hypertension: Secondary | ICD-10-CM

## 2013-03-04 DIAGNOSIS — R799 Abnormal finding of blood chemistry, unspecified: Secondary | ICD-10-CM

## 2013-03-04 LAB — BASIC METABOLIC PANEL WITH GFR
BUN: 29 mg/dL — ABNORMAL HIGH (ref 6–23)
CO2: 30 mEq/L (ref 19–32)
Calcium: 9.6 mg/dL (ref 8.4–10.5)
Chloride: 100 mEq/L (ref 96–112)
Creat: 1.13 mg/dL — ABNORMAL HIGH (ref 0.50–1.10)
GFR, Est African American: 59 mL/min — ABNORMAL LOW
GFR, Est Non African American: 51 mL/min — ABNORMAL LOW
Glucose, Bld: 95 mg/dL (ref 70–99)
Potassium: 4.2 mEq/L (ref 3.5–5.3)
Sodium: 138 mEq/L (ref 135–145)

## 2013-03-04 LAB — GLUCOSE, POCT (MANUAL RESULT ENTRY): POC Glucose: 106 mg/dl — AB (ref 70–99)

## 2013-03-04 LAB — POCT GLYCOSYLATED HEMOGLOBIN (HGB A1C): Hemoglobin A1C: 5.4

## 2013-03-04 MED ORDER — METOPROLOL SUCCINATE ER 25 MG PO TB24: 25.0000 mg | ORAL_TABLET | Freq: Every day | ORAL | Status: DC

## 2013-03-04 NOTE — Progress Notes (Signed)
Subjective:    Patient ID: Linda Allen, female    DOB: 1945-05-27, 68 y.o.   MRN: [id number]  HPI Linda Allen is a 68 y.o. female  Prediabetes - improved A1c last ov.   Continued diet, exercise - 155min/week. Last A1C: 5.5 1 month ago. Feels great.  Diet still doing well. Wt [phone].   Essential hypertension, benign -Controlled on prior regimen. Possible over treatment with drop in renal function prior.  Home BP's - 125/80.  No new side effects of meds.  Lab Results  Component Value Date   CREATININE 1.26* 01/28/2013  range of creat 1.71 to 1.48 to 1.26 over past 3 months. 1.14 1 year ago. Due to BP control, held ACE- I and decreased hctz to 12.40m qd. Continued on metoprolol 25mg  qd.   L shoulder pain - NKI, hx arthritis, planned on following up with ortho - no meds. Worried about effect on kidneys.   Patient Active Problem List   Diagnosis Date Noted  . MORBID OBESITY 01/12/2010  . HYPERTENSION 12/22/2008  . MURMUR 12/22/2008   Past Medical History  Diagnosis Date  . Hypertension   . Allergy   . Arthritis    Past Surgical History  Procedure Laterality Date  . Joint replacement  2011    both knees   Allergies  Allergen Reactions  . Ivp Dye [Iodinated Diagnostic Agents]   . Penicillins Hives and Rash   Prior to Admission medications   Medication Sig Start Date End Date Taking? Authorizing Provider  aspirin 81 MG tablet Take 81 mg by mouth daily.   Yes Historical Provider, MD  Fish Oil-Cholecalciferol (FISH OIL + D3 PO) Take by mouth.   Yes Historical Provider, MD  fluticasone (FLONASE) 50 MCG/ACT nasal spray Place 2 sprays into both nostrils daily. 01/30/13  Yes Wendie Agreste, MD  hydrochlorothiazide (HYDRODIURIL) 12.5 MG tablet Take 1 tablet (12.5 mg total) by mouth daily. 12/24/12  Yes Wendie Agreste, MD  metoprolol succinate (TOPROL-XL) 25 MG 24 hr tablet Take 1 tablet (25 mg total) by mouth daily. 10/15/12  Yes Wendie Agreste, MD  mupirocin  ointment (BACTROBAN) 2 % Apply topically 2 (two) times daily. To inside of nares for 1 week. 10/15/12  Yes Wendie Agreste, MD  cetirizine (ZYRTEC) 10 MG tablet Take 10 mg by mouth daily.    Historical Provider, MD  ibuprofen (ADVIL,MOTRIN) 200 MG tablet Take 200 mg by mouth every 6 (six) hours as needed. For pain    Historical Provider, MD   History   Social History  . Marital Status: Married    Spouse Name: N/A    Number of Children: N/A  . Years of Education: N/A   Occupational History  . Not on file.   Social History Main Topics  . Smoking status: Former Research scientist (life sciences)  . Smokeless tobacco: Not on file  . Alcohol Use: Yes  . Drug Use: Not on file  . Sexual Activity: Yes     Comment: number of sex partners in the last 16 months  1   Other Topics Concern  . Not on file   Social History Narrative   Exercise walking 3 times per week for 30 minutes.      Anticipatory guidance - has living will, full code, but would not want to be on ventilator for prolonged time. organ donor.             Review of Systems  Constitutional: Negative for fatigue and unexpected  weight change.  Respiratory: Negative for chest tightness and shortness of breath.   Cardiovascular: Negative for chest pain, palpitations and leg swelling.  Gastrointestinal: Negative for abdominal pain and blood in stool.  Genitourinary: Negative for decreased urine volume and difficulty urinating.  Musculoskeletal: Negative for joint swelling.  Neurological: Negative for dizziness, syncope, light-headedness and headaches.       Objective:   Physical Exam  Vitals reviewed. Constitutional: She is oriented to person, place, and time. She appears well-developed and well-nourished.  HENT:  Head: Normocephalic and atraumatic.  Eyes: Conjunctivae and EOM are normal. Pupils are equal, round, and reactive to light.  Neck: Carotid bruit is not present.  Cardiovascular: Normal rate, regular rhythm, normal heart sounds and  intact distal pulses.   Pulmonary/Chest: Effort normal and breath sounds normal.  Abdominal: Soft. She exhibits no pulsatile midline mass. There is no tenderness.  Neurological: She is alert and oriented to person, place, and time.  Skin: Skin is warm and dry.  Psychiatric: She has a normal mood and affect. Her behavior is normal.   Filed Vitals:   03/04/13 1338  BP: 124/64  Pulse: 62  Temp: 98.2 F (36.8 C)  TempSrc: Oral  Resp: 18  Height: 5' 7.75" (1.721 m)  Weight: 296 lb 6.4 oz (134.446 kg)  SpO2: 100%       Assessment & Plan:   Linda Allen is a 68 y.o. female  HTN - cont same dose metoprolol, and HCTZ for now. Depending on kidney function, consider change from metoprolol to low dose lisinopril if looking like CXD for nephroprotection. Repeat creatinine pending. And if remains elevated, may need renal eval. BMP with GFR pending.   Hyperglycemia/prediabetes - again applauded on wt loss efforts, and continued diet adherence as well as exercise. Recheck in next 1 month - she has felt this to be necessary for routine follow up and adherence to wt loss, diet and exercise changes.    Meds ordered this encounter           . metoprolol succinate (TOPROL-XL) 25 MG 24 hr tablet    Sig: Take 1 tablet (25 mg total) by mouth daily.    Dispense:  30 tablet    Refill:  3   Patient Instructions  You should receive a call or letter about your lab results within the next week to 10 days.

## 2013-03-04 NOTE — Patient Instructions (Signed)
You should receive a call or letter about your lab results within the next week to 10 days.

## 2013-03-04 NOTE — Progress Notes (Signed)
Hemoglobin A1C ordered in error, could not cancel. This should not be charged to patient, can not be performed more often than every 3 months. Dr Carlota Raspberry aware of error.

## 2013-03-28 ENCOUNTER — Other Ambulatory Visit: Payer: Self-pay | Admitting: Family Medicine

## 2013-03-28 ENCOUNTER — Telehealth: Payer: Self-pay

## 2013-03-28 NOTE — Telephone Encounter (Signed)
Patient called stated [facility] sent her a request to renew her medications, Called today and [facility] stated they faxed over a request and told her to call Dr. Carlota Raspberry to make sure she can get her medication Hydrochorothiazide 12.5 mg by mouth

## 2013-03-29 NOTE — Telephone Encounter (Signed)
Pt advised rx has been sent.

## 2013-04-08 ENCOUNTER — Ambulatory Visit: Payer: BC Managed Care – PPO | Admitting: Family Medicine

## 2013-04-15 ENCOUNTER — Encounter: Payer: Self-pay | Admitting: Family Medicine

## 2013-04-15 ENCOUNTER — Ambulatory Visit (INDEPENDENT_AMBULATORY_CARE_PROVIDER_SITE_OTHER): Payer: BC Managed Care – PPO | Admitting: Family Medicine

## 2013-04-15 VITALS — BP 120/68 | HR 78 | Temp 98.3°F | Resp 16 | Ht 68.0 in | Wt 295.4 lb

## 2013-04-15 DIAGNOSIS — R7989 Other specified abnormal findings of blood chemistry: Secondary | ICD-10-CM

## 2013-04-15 DIAGNOSIS — E669 Obesity, unspecified: Secondary | ICD-10-CM

## 2013-04-15 NOTE — Patient Instructions (Signed)
Keep up the good work!.  Recheck with blood tests in next 2 months.

## 2013-04-15 NOTE — Progress Notes (Addendum)
Subjective:  This chart was scribed for Linda Ray, MD by Linda Allen, Scribe.  This patient was seen in Room 23 and the patient's care was started at 3:12 PM.   Patient ID: Linda Allen, female    DOB: 1945/04/02, 68 y.o.   MRN: [id number]  HPI  Linda Allen is a 68 y.o. female PCP: Linda Agreste, MD  Pt presents for a f/u on her weight.  Last office visit was 2/23.  She states she is doing well overall and has no specific complaints.  1. Prediabetes.  Improved A1C at last office visit of 5.4, with weight loss and diet changes.  Weight was 315 on October 6, now down to 295.  She is running 2.75 miles 3x/week.  She is not doing resistance exercise.  She is doing well with her diet.  She does not have a glucometer at home.  2. Elevated creatinine/renal insufficiency.  Creatinine up to 1.71 in November 2014.  Most recent creatinine 1.13, with eGFR 51.  She has arthritis in her left shoulder and received an injection recently which she states improved her pain significantly.  She denies any other significant joint issues currently.   Patient Active Problem List   Diagnosis Date Noted  . MORBID OBESITY 01/12/2010  . HYPERTENSION 12/22/2008  . MURMUR 12/22/2008   Past Medical History  Diagnosis Date  . Hypertension   . Allergy   . Arthritis    Past Surgical History  Procedure Laterality Date  . Joint replacement  2011    both knees   Allergies  Allergen Reactions  . Ivp Dye [Iodinated Diagnostic Agents]   . Penicillins Hives and Rash   Prior to Admission medications   Medication Sig Start Date End Date Taking? Authorizing Provider  aspirin 81 MG tablet Take 81 mg by mouth daily.   Yes Historical Provider, MD  Fish Oil-Cholecalciferol (FISH OIL + D3 PO) Take by mouth.   Yes Historical Provider, MD  fluticasone (FLONASE) 50 MCG/ACT nasal spray Place 2 sprays into both nostrils daily. 01/30/13  Yes Linda Agreste, MD  hydrochlorothiazide  (HYDRODIURIL) 12.5 MG tablet take 1 tablet by mouth once daily 03/28/13  Yes Ryan M Dunn, PA-C  metoprolol succinate (TOPROL-XL) 25 MG 24 hr tablet Take 1 tablet (25 mg total) by mouth daily. 03/04/13  Yes Linda Agreste, MD  cetirizine (ZYRTEC) 10 MG tablet Take 10 mg by mouth daily.    Historical Provider, MD  ibuprofen (ADVIL,MOTRIN) 200 MG tablet Take 200 mg by mouth every 6 (six) hours as needed. For pain    Historical Provider, MD  mupirocin ointment (BACTROBAN) 2 % Apply topically 2 (two) times daily. To inside of nares for 1 week. 10/15/12   Linda Agreste, MD   History   Social History  . Marital Status: Married    Spouse Name: N/A    Number of Children: N/A  . Years of Education: N/A   Occupational History  . Not on file.   Social History Main Topics  . Smoking status: Former Research scientist (life sciences)  . Smokeless tobacco: Not on file  . Alcohol Use: Yes  . Drug Use: Not on file  . Sexual Activity: Yes     Comment: number of sex partners in the last 36 months  1   Other Topics Concern  . Not on file   Social History Narrative   Exercise walking 3 times per week for 30 minutes.      Anticipatory guidance -  has living will, full code, but would not want to be on ventilator for prolonged time. organ donor.             Review of Systems  Constitutional: Negative for fatigue and unexpected weight change.  Respiratory: Negative for chest tightness and shortness of breath.   Cardiovascular: Negative for chest pain, palpitations and leg swelling.  Gastrointestinal: Negative for abdominal pain and blood in stool.  Genitourinary: Negative for decreased urine volume and difficulty urinating.  Musculoskeletal: Negative for joint swelling.  Neurological: Negative for dizziness, syncope, light-headedness and headaches.       Objective:   Physical Exam  Nursing note and vitals reviewed. Constitutional: She is oriented to person, place, and time. She appears well-developed and  well-nourished. No distress.  HENT:  Head: Normocephalic and atraumatic.  Eyes: Conjunctivae and EOM are normal.  Neck: Neck supple. No tracheal deviation present.  Cardiovascular: Normal rate, regular rhythm and normal heart sounds.   No murmur heard. Pulmonary/Chest: Effort normal and breath sounds normal. No respiratory distress. She has no wheezes. She has no rales.  Musculoskeletal: Normal range of motion. She exhibits no edema.  Neurological: She is alert and oriented to person, place, and time.  Skin: Skin is warm and dry.  Psychiatric: She has a normal mood and affect. Her behavior is normal.     BP 120/68  Pulse 78  Temp(Src) 98.3 F (36.8 C) (Oral)  Resp 16  Ht 5' 8" (1.727 m)  Wt 295 lb 6.4 oz (133.993 kg)  BMI 44.93 kg/m2  SpO2 97%      Assessment & Plan:    Linda Allen is a 68 y.o. female Obesity, unspecified - with prior prediabetes - much improved.  Commended again on her efforts, even with minimal weight change since last ov.  Did discuss USPSTF recommendations regarding intensive behavioral change and would consider nutritionist if still not changing much in next 3-6 months. Medical and surgical options also possible, but agreed with her that she has done well on her own and will continue diet and exercise, and referral to nutritionist can be deferred at present.  Can now add resistive weight exercise to regimen.   Elevated serum creatinine - imporved last ov - avoid nephrotoxins, including NSAIDS.    No orders of the defined types were placed in this encounter.   Patient Instructions  Keep up the good work!.  Recheck with blood tests in next 2 months.

## 2013-05-13 ENCOUNTER — Ambulatory Visit: Payer: BC Managed Care – PPO | Admitting: Family Medicine

## 2013-06-17 ENCOUNTER — Ambulatory Visit (INDEPENDENT_AMBULATORY_CARE_PROVIDER_SITE_OTHER): Payer: BC Managed Care – PPO | Admitting: Family Medicine

## 2013-06-17 ENCOUNTER — Encounter: Payer: Self-pay | Admitting: Family Medicine

## 2013-06-17 VITALS — BP 130/72 | HR 69 | Temp 98.6°F | Resp 16 | Ht 67.5 in | Wt 288.6 lb

## 2013-06-17 DIAGNOSIS — R7309 Other abnormal glucose: Secondary | ICD-10-CM

## 2013-06-17 DIAGNOSIS — R748 Abnormal levels of other serum enzymes: Secondary | ICD-10-CM

## 2013-06-17 DIAGNOSIS — R7303 Prediabetes: Secondary | ICD-10-CM

## 2013-06-17 LAB — BASIC METABOLIC PANEL
BUN: 33 mg/dL — ABNORMAL HIGH (ref 6–23)
CO2: 27 mEq/L (ref 19–32)
Calcium: 9.4 mg/dL (ref 8.4–10.5)
Chloride: 99 mEq/L (ref 96–112)
Creat: 1.19 mg/dL — ABNORMAL HIGH (ref 0.50–1.10)
Glucose, Bld: 90 mg/dL (ref 70–99)
Potassium: 3.6 mEq/L (ref 3.5–5.3)
Sodium: 135 mEq/L (ref 135–145)

## 2013-06-17 LAB — GLUCOSE, POCT (MANUAL RESULT ENTRY): POC Glucose: 89 mg/dl (ref 70–99)

## 2013-06-17 LAB — POCT GLYCOSYLATED HEMOGLOBIN (HGB A1C): Hemoglobin A1C: 5.8

## 2013-06-17 NOTE — Patient Instructions (Signed)
You should receive a call or letter about your lab results within the next week to 10 days.   Keep up the good work with exercise and diet. I agree with the addition of 2 more days of exercise.   Recheck for physical in 3 months

## 2013-06-17 NOTE — Progress Notes (Signed)
Subjective:    Patient ID: [name], female    DOB: 07/31/1945, 68 y.o.   MRN: [id number]  HPI KINJAL NEITZKE is a 68 y.o. female  Here for follow up.   Obesity - wt 295 last ov. Has made great improvements with diet and exercise/activity changes. discussed nutritionist and intensive approach last ov based on her BMI, but chose to continue current efforts. Wt 295 last ov 2 months ago. 288 today. Exercising with trainer at [employer], 3 days a week at [place] on adding 2 more days on her own. Energy during the day, happy. Diet still going well.   Prediabetes.  Improved A1C in February with wt loss.  Lab Results  Component Value Date   HGBA1C 5.4 03/04/2013   Elevated creatinine/renal insufficiency.  Creatinine up to 1.71 in November 2014.  Most recent creatinine 1.13, with eGFR 51. Plan to maintain hydration and avoid nsaids/nephrotoxins. Not taking NSAIDS. Drinking plenty of fluids, urinating normally.  Lab Results  Component Value Date   CREATININE 1.13* 03/04/2013    Patient Active Problem List   Diagnosis Date Noted  . MORBID OBESITY 01/12/2010  . HYPERTENSION 12/22/2008  . MURMUR 12/22/2008   Past Medical History  Diagnosis Date  . Hypertension   . Allergy   . Arthritis    Past Surgical History  Procedure Laterality Date  . Joint replacement  2011    both knees   Allergies  Allergen Reactions  . Ivp Dye [Iodinated Diagnostic Agents]   . Penicillins Hives and Rash   Prior to Admission medications   Medication Sig Start Date End Date Taking? Authorizing Provider  aspirin 81 MG tablet Take 81 mg by mouth daily.   Yes Historical Provider, MD  Fish Oil-Cholecalciferol (FISH OIL + D3 PO) Take by mouth.   Yes Historical Provider, MD  fluticasone (FLONASE) 50 MCG/ACT nasal spray Place 2 sprays into both nostrils daily. 01/30/13  Yes Wendie Agreste, MD  hydrochlorothiazide (HYDRODIURIL) 12.5 MG tablet take 1 tablet by mouth once daily 03/28/13  Yes Ryan M  Dunn, PA-C  metoprolol succinate (TOPROL-XL) 25 MG 24 hr tablet Take 1 tablet (25 mg total) by mouth daily. 03/04/13  Yes Wendie Agreste, MD  cetirizine (ZYRTEC) 10 MG tablet Take 10 mg by mouth daily.    Historical Provider, MD  ibuprofen (ADVIL,MOTRIN) 200 MG tablet Take 200 mg by mouth every 6 (six) hours as needed. For pain    Historical Provider, MD  mupirocin ointment (BACTROBAN) 2 % Apply topically 2 (two) times daily. To inside of nares for 1 week. 10/15/12   Wendie Agreste, MD   History   Social History  . Marital Status: Married    Spouse Name: N/A    Number of Children: N/A  . Years of Education: N/A   Occupational History  . Not on file.   Social History Main Topics  . Smoking status: Former Research scientist (life sciences)  . Smokeless tobacco: Not on file  . Alcohol Use: Yes  . Drug Use: Not on file  . Sexual Activity: Yes     Comment: number of sex partners in the last 64 months  1   Other Topics Concern  . Not on file   Social History Narrative   Exercise walking 3 times per week for 30 minutes.      Anticipatory guidance - has living will, full code, but would not want to be on ventilator for prolonged time. organ donor.  Review of Systems  Constitutional: Negative for appetite change, fatigue and unexpected weight change.  Respiratory: Negative for chest tightness and shortness of breath.   Cardiovascular: Negative for chest pain, palpitations and leg swelling.       Hx of heart murmur.   Gastrointestinal: Negative for abdominal pain and blood in stool.  Neurological: Negative for dizziness, syncope, light-headedness and headaches.       Objective:   Physical Exam  Vitals reviewed. Constitutional: She is oriented to person, place, and time. She appears well-developed and well-nourished.  Overweight/obese.   HENT:  Head: Normocephalic and atraumatic.  Eyes: Conjunctivae and EOM are normal. Pupils are equal, round, and reactive to light.  Neck: Carotid  bruit is not present.  Cardiovascular: Normal rate, regular rhythm and intact distal pulses.  Exam reveals no friction rub.   Murmur heard. Pulmonary/Chest: Effort normal and breath sounds normal.  Abdominal: Soft. She exhibits no pulsatile midline mass. There is no tenderness.  Neurological: She is alert and oriented to person, place, and time.  Skin: Skin is warm and dry.  Psychiatric: She has a normal mood and affect. Her behavior is normal.   Filed Vitals:   06/17/13 1400  BP: 130/72  Pulse: 69  Temp: 98.6 F (37 C)  TempSrc: Oral  Resp: 16  Height: 5' 7.5" (1.715 m)  Weight: 288 lb 9.6 oz (130.908 kg)  SpO2: 98%   Results for orders placed in visit on 06/17/13  GLUCOSE, POCT (MANUAL RESULT ENTRY)      Result Value Ref Range   POC Glucose 89  70 - 99 mg/dl  POCT GLYCOSYLATED HEMOGLOBIN (HGB A1C)      Result Value Ref Range   Hemoglobin A1C 5.8         Assessment & Plan:  JEANICE DEMPSEY is a 68 y.o. female Prediabetes - Plan: POCT glucose (manual entry), POCT glycosylated hemoglobin (Hb R7E), Basic metabolic panel  - cont diet and exercise. Slight bump in A1c, but glucose ok and losing weight - encouraged on this and adding in 2 more days of exercise should help.  Elevated creatine kinase - Plan: POCT glucose (manual entry), POCT glycosylated hemoglobin (Hb Y8X), Basic metabolic panel  - recheck BMP. HTN control ok.  Morbid obesity - Plan: POCT glucose (manual entry), POCT glycosylated hemoglobin (Hb K4Y), Basic metabolic panel  - cont wt loss, exercise. recehck with A1c in 3-4 months at [facility].   No orders of the defined types were placed in this encounter.   Patient Instructions  You should receive a call or letter about your lab results within the next week to 10 days.   Keep up the good work with exercise and diet. I agree with the addition of 2 more days of exercise.   Recheck for physical in 3 months

## 2013-07-26 ENCOUNTER — Other Ambulatory Visit: Payer: Self-pay | Admitting: Family Medicine

## 2013-08-26 ENCOUNTER — Other Ambulatory Visit: Payer: Self-pay | Admitting: Physician Assistant

## 2013-09-02 ENCOUNTER — Other Ambulatory Visit: Payer: Self-pay | Admitting: Family Medicine

## 2013-09-24 ENCOUNTER — Other Ambulatory Visit: Payer: Self-pay | Admitting: Family Medicine

## 2013-10-14 ENCOUNTER — Encounter: Payer: BC Managed Care – PPO | Admitting: Family Medicine

## 2013-11-06 LAB — HM MAMMOGRAPHY

## 2013-12-01 ENCOUNTER — Other Ambulatory Visit: Payer: Self-pay | Admitting: Physician Assistant

## 2013-12-03 ENCOUNTER — Other Ambulatory Visit: Payer: Self-pay | Admitting: Physician Assistant

## 2013-12-17 ENCOUNTER — Encounter: Payer: Self-pay | Admitting: *Deleted

## 2014-01-21 ENCOUNTER — Other Ambulatory Visit: Payer: Self-pay | Admitting: Dermatology

## 2014-01-21 DIAGNOSIS — D229 Melanocytic nevi, unspecified: Secondary | ICD-10-CM

## 2014-01-21 HISTORY — DX: Melanocytic nevi, unspecified: D22.9

## 2014-01-25 ENCOUNTER — Other Ambulatory Visit: Payer: Self-pay | Admitting: Family Medicine

## 2014-03-09 ENCOUNTER — Other Ambulatory Visit: Payer: Self-pay | Admitting: Family Medicine

## 2014-04-03 ENCOUNTER — Other Ambulatory Visit: Payer: Self-pay | Admitting: Physician Assistant

## 2014-04-15 ENCOUNTER — Other Ambulatory Visit: Payer: Self-pay | Admitting: Family Medicine

## 2014-06-05 ENCOUNTER — Other Ambulatory Visit: Payer: Self-pay | Admitting: Family Medicine

## 2014-08-11 ENCOUNTER — Other Ambulatory Visit: Payer: Self-pay | Admitting: Physician Assistant

## 2014-08-12 NOTE — Telephone Encounter (Signed)
LMOM for pt to CB w/ f/up plan.

## 2014-09-11 ENCOUNTER — Other Ambulatory Visit: Payer: Self-pay

## 2017-12-04 ENCOUNTER — Encounter (HOSPITAL_COMMUNITY): Payer: Self-pay

## 2017-12-04 ENCOUNTER — Other Ambulatory Visit: Payer: Self-pay

## 2017-12-04 ENCOUNTER — Ambulatory Visit (HOSPITAL_COMMUNITY)
Admission: EM | Admit: 2017-12-04 | Discharge: 2017-12-04 | Disposition: A | Discharge status: Home / Self Care | Payer: Medicare Other | Attending: Family Medicine | Admitting: Family Medicine

## 2017-12-04 ENCOUNTER — Ambulatory Visit (HOSPITAL_COMMUNITY)
Admission: RE | Admit: 2017-12-04 | Discharge: 2017-12-04 | Disposition: A | Discharge status: Home / Self Care | Payer: Medicare Other | Source: Ambulatory Visit | Attending: Emergency Medicine | Admitting: Emergency Medicine

## 2017-12-04 DIAGNOSIS — I872 Venous insufficiency (chronic) (peripheral): Secondary | ICD-10-CM | POA: Diagnosis not present

## 2017-12-04 DIAGNOSIS — M7989 Other specified soft tissue disorders: Secondary | ICD-10-CM

## 2017-12-04 DIAGNOSIS — L259 Unspecified contact dermatitis, unspecified cause: Secondary | ICD-10-CM

## 2017-12-04 DIAGNOSIS — R21 Rash and other nonspecific skin eruption: Secondary | ICD-10-CM

## 2017-12-04 MED ORDER — PREDNISONE 20 MG PO TABS: 20.0000 mg | ORAL_TABLET | Freq: Two times a day (BID) | ORAL | 0 refills | Status: AC

## 2017-12-04 NOTE — Discharge Instructions (Addendum)
We will order a doppler of the RLE to rule out possible DVT If doppler is negative please proceed with the plan below. Prescribe prednisone.  Take as directed and to completion Elevate legs Recommend wearing compression stockings Return or follow up with PCP for further evaluation and management Return or go to the ER if you have any new or worsening symptoms such as fever, chills, nausea, vomiting, redness, swelling, discharge, if symptoms do not improve with medications, etc..Marland Kitchen

## 2017-12-04 NOTE — ED Triage Notes (Signed)
Pt cc rash on her left arm and right  Leg. X 1 week and  eyelids are swelling a week as well. Pt states she need some b/p meds, but she has never been on any b/p med. Pt thinks she needs b/p med.

## 2017-12-04 NOTE — Progress Notes (Signed)
*  Preliminary Results* Right lower extremity venous duplex completed. Right lower extremity is negative for deep vein thrombosis. There is no evidence of right Baker's cyst.  12/04/2017 11:51 AM  Maudry Mayhew, MHA, RVT, RDCS, RDMS

## 2017-12-04 NOTE — ED Notes (Signed)
Pt has an appointment for [facility] at 11:00.  Pt aware.  She is going to go home to get some Benadryl then go straight to [facility].  Pt is with her husband.

## 2017-12-04 NOTE — ED Notes (Signed)
LM w/ Vascular Lab to schedule a venous doppler.

## 2017-12-04 NOTE — ED Provider Notes (Signed)
[place]   [id number] 12/04/17 Arrival Time: 0830  CC: SKIN COMPLAINT  SUBJECTIVE:  Linda Allen is a 72 y.o. female who presents with a skin complaint that began 1-2 weeks ago.  Denies precipitating event or trauma.  Denies changes in soaps, detergents, close contacts with similar rash, known trigger or environmental trigger, allergy. Denies medications change or starting a new medication recently.  Localizes the rash to left forearm and right lower extremity.  Describes it as left forearm rash as red, itchy and improving.  Left lower extremity rash as worsening and painful.  Has tried OTC medications like benadryl with relief of forearm rash.  Denies aggravating factors.  Reports similar rash in the past and diagnosed with hives.  Also mentions mild involvement of bilateral corners of eyelids.  Denies fever, chills, nausea, vomiting, swelling, discharge, vision changes, eye discharge, oral lesions, SOB, chest pain, abdominal pain, calf pain, changes in bowel or bladder function.    Denies chest pain, SOB, tachycardia, tobacco use, recent long travel, recent surgery, recent diagnosis of malignancy, hormone use.    ROS: As per HPI.  Past Medical History:  Diagnosis Date  . Allergy   . Arthritis   . Hypertension    Past Surgical History:  Procedure Laterality Date  . JOINT REPLACEMENT  2011   both knees   Allergies  Allergen Reactions  . Ivp Dye [Iodinated Diagnostic Agents]   . Penicillins Hives and Rash   No current facility-administered medications on file prior to encounter.    Current Outpatient Medications on File Prior to Encounter  Medication Sig Dispense Refill  . aspirin 81 MG tablet Take 81 mg by mouth daily.    . cetirizine (ZYRTEC) 10 MG tablet Take 10 mg by mouth daily.    . Fish Oil-Cholecalciferol (FISH OIL + D3 PO) Take by mouth.    . fluticasone (FLONASE) 50 MCG/ACT nasal spray USE 2 SPRAYS INTO BOTH NOSTRILS ONCE DAILY 16 g 6  .  hydrochlorothiazide (HYDRODIURIL) 12.5 MG tablet TAKE 1 TABLET BY MOUTH ONCE DAILY "OV NEEDED FOR ADDITIONAL REFILLS" 30 tablet 0  . ibuprofen (ADVIL,MOTRIN) 200 MG tablet Take 200 mg by mouth every 6 (six) hours as needed. For pain    . lisinopril-hydrochlorothiazide (PRINZIDE,ZESTORETIC) 20-12.5 MG per tablet TAKE 1 TABLET BY MOUTH ONCE DAILY.  "NO MORE REFILLS WITHOUT OV' 15 tablet 0  . metoprolol succinate (TOPROL-XL) 25 MG 24 hr tablet TAKE 1 TABLET BY MOUTH ONCE DAILY  "NO MORE REFILLS WITHOUT OV" 15 tablet 0  . mupirocin ointment (BACTROBAN) 2 % Apply topically 2 (two) times daily. To inside of nares for 1 week. 22 g 0   Social History   Socioeconomic History  . Marital status: Married    Spouse name: Not on file  . Number of children: Not on file  . Years of education: Not on file  . Highest education level: Not on file  Occupational History  . Not on file  Social Needs  . Financial resource strain: Not on file  . Food insecurity:    Worry: Not on file    Inability: Not on file  . Transportation needs:    Medical: Not on file    Non-medical: Not on file  Tobacco Use  . Smoking status: Former Research scientist (life sciences)  . Smokeless tobacco: Never Used  Substance and Sexual Activity  . Alcohol use: Yes  . Drug use: Not on file  . Sexual activity: Yes    Comment: number of  sex partners in the last 48 months  1  Lifestyle  . Physical activity:    Days per week: Not on file    Minutes per session: Not on file  . Stress: Not on file  Relationships  . Social connections:    Talks on phone: Not on file    Gets together: Not on file    Attends religious service: Not on file    Active member of club or organization: Not on file    Attends meetings of clubs or organizations: Not on file    Relationship status: Not on file  . Intimate partner violence:    Fear of current or ex partner: Not on file    Emotionally abused: Not on file    Physically abused: Not on file    Forced sexual activity:  Not on file  Other Topics Concern  . Not on file  Social History Narrative   Exercise walking 3 times per week for 30 minutes.      Anticipatory guidance - has living will, full code, but would not want to be on ventilator for prolonged time. organ donor.             History reviewed. No pertinent family history.  OBJECTIVE: Vitals:   12/04/17 0906 12/04/17 0908  BP:  132/86  Pulse:  76  Resp:  18  Temp:  98.6 F (37 C)  TempSrc:  Oral  SpO2:  99%  Weight: 290 lb (131.5 kg)     General appearance: alert; no distress Head: NCAT Lungs: clear to auscultation bilaterally Heart: Murmur present.  Radial pulse 2+ bilaterally Extremities: no edema; no calf tenderness; ankle and calf circumferences equal and symmetrical bilaterally Skin: warm and dry; left forearm erythematous maculopapular rash that blanches and is nontender; LLE violaceous skin changes with overlying thickened dry skin with erythematous papular patch localized to medial calf, mildly tender to palpation, blanches with pressure; no obvious drainage or bleeding (see pictures below) Psychological: alert and cooperative; normal mood and affect         ASSESSMENT & PLAN:  1. Venous stasis dermatitis of both lower extremities   2. Rash   3. Contact dermatitis, unspecified contact dermatitis type, unspecified trigger    Doppler negative.    Meds ordered this encounter  Medications  . predniSONE (DELTASONE) 20 MG tablet    Sig: Take 1 tablet (20 mg total) by mouth 2 (two) times daily with a meal for 5 days.    Dispense:  10 tablet    Refill:  0    Order Specific Question:   Supervising Provider    Answer:   Wynona Luna [[id number]]   We will order a doppler of the RLE to rule out possible DVT If doppler is negative please proceed with the plan below. Prescribe prednisone.  Take as directed and to completion Elevate legs Recommend wearing compression stockings Return or follow up with PCP for further  evaluation and management Return or go to the ER if you have any new or worsening symptoms such as fever, chills, nausea, vomiting, redness, swelling, discharge, if symptoms do not improve with medications, etc...  Reviewed expectations re: course of current medical issues. Questions answered. Outlined signs and symptoms indicating need for more acute intervention. Patient verbalized understanding. After Visit Summary given.   Lestine Box, PA-C 12/04/17 [id number]

## 2017-12-06 ENCOUNTER — Telehealth (HOSPITAL_COMMUNITY): Payer: Self-pay | Admitting: Emergency Medicine

## 2017-12-06 NOTE — Telephone Encounter (Signed)
Attempted to reach patient. No answer at this time. Voicemail left.   Pt called right back and was already aware of test results. All questions answered.

## 2017-12-28 ENCOUNTER — Encounter: Payer: Self-pay | Admitting: Internal Medicine

## 2017-12-28 ENCOUNTER — Ambulatory Visit: Payer: Medicare Other | Admitting: Internal Medicine

## 2017-12-28 VITALS — BP 140/80 | HR 82 | Temp 97.4°F | Ht 66.0 in | Wt 278.0 lb

## 2017-12-28 DIAGNOSIS — M1990 Unspecified osteoarthritis, unspecified site: Secondary | ICD-10-CM | POA: Insufficient documentation

## 2017-12-28 DIAGNOSIS — R21 Rash and other nonspecific skin eruption: Secondary | ICD-10-CM | POA: Diagnosis not present

## 2017-12-28 DIAGNOSIS — R030 Elevated blood-pressure reading, without diagnosis of hypertension: Secondary | ICD-10-CM

## 2017-12-28 DIAGNOSIS — Z6841 Body Mass Index (BMI) 40.0 and over, adult: Secondary | ICD-10-CM

## 2017-12-28 DIAGNOSIS — L03115 Cellulitis of right lower limb: Secondary | ICD-10-CM

## 2017-12-28 DIAGNOSIS — E662 Morbid (severe) obesity with alveolar hypoventilation: Secondary | ICD-10-CM

## 2017-12-28 MED ORDER — RANITIDINE HCL 150 MG PO TABS: ORAL_TABLET | ORAL | 0 refills | Status: DC

## 2017-12-28 MED ORDER — DOXYCYCLINE HYCLATE 100 MG PO TABS: 100.0000 mg | ORAL_TABLET | Freq: Two times a day (BID) | ORAL | 0 refills | Status: DC

## 2017-12-28 NOTE — Progress Notes (Signed)
Subjective:     Patient ID: Linda Allen , female    DOB: July 27, 1945 , 72 y.o.   MRN: [id number]   Chief Complaint  Patient presents with  . New Patient (Initial Visit)  . Rash    left arm     HPI  She is here to establish with [place], used to see Dr Nyoka Cowden and had been loosing wt and was told to not come back monthly and she is not happy about this. Has not been at a PCP in 3 years.  Rash onset R upper chest, and 12/20 with L forearm pustules and then turned into flat red bumps and was placed on prednisone and got better, and returned 4 days ago, and has been taking benadryl and is a little better. Been applying Eucerin as well.  She also has R shin redness. Started  after having a red circle mid ankle, and has spread to mid anterior shin. This area is tender is hot. Had duplex and was neg on 12/20.  Three years ago she had antecubital redness with no bumps but again, no one could figure out what was wrong. She denies taking any new meds, supplements, eating new foods or using anything new topically or detergents.  Had allergy testing  Many ys ago and had 200 environmental allergens.  Past Medical History:  Diagnosis Date  . Allergy   . Arthritis    L hip  . Hypertension   . Scleroderma ([place])      Family History  Problem Relation Age of Onset  . CVA Mother   . Parkinson's disease Father   . Eczema Sister   . Ulcerative colitis Brother      Current Outpatient Medications:  .  aspirin 81 MG tablet, Take 81 mg by mouth daily., Disp: , Rfl:    Allergies  Allergen Reactions  . Ivp Dye [Iodinated Diagnostic Agents]   . Penicillins Hives and Rash     Review of Systems  Constitutional: Negative for chills, diaphoresis and fever.  HENT: Negative for tinnitus.   Respiratory: Negative for chest tightness and shortness of breath.   Cardiovascular: Negative for chest pain, palpitations and leg swelling.  Genitourinary: Negative for dysuria, frequency and urgency.   Musculoskeletal: Positive for myalgias. Negative for gait problem.  Skin: Positive for rash.       Has chronic scleroderma on L shin  Neurological: Negative for dizziness, tremors, seizures, syncope, weakness, numbness and headaches.  Hematological: Negative for adenopathy. Does not bruise/bleed easily.    Today's Vitals   12/28/17 1426  BP: 140/80  Pulse: 82  Temp: (!) 97.4 F (36.3 C)  TempSrc: Oral  SpO2: 97%  Weight: 278 lb (126.1 kg)  Height: 5\' 6"  (1.676 m)   Body mass index is 44.87 kg/m.   Objective:  Physical Exam Vitals signs and nursing note reviewed.  Constitutional:      Appearance: She is obese.  HENT:     Head: Normocephalic.     Right Ear: External ear normal.     Left Ear: External ear normal.     Nose: Nose normal.  Eyes:     General: No scleral icterus.    Conjunctiva/sclera: Conjunctivae normal.  Neck:     Musculoskeletal: Neck supple.     Vascular: No carotid bruit.  Cardiovascular:     Rate and Rhythm: Normal rate and regular rhythm.     Heart sounds: No murmur.  Pulmonary:     Effort: Pulmonary effort is  normal.     Breath sounds: Normal breath sounds.  Musculoskeletal: Normal range of motion.  Lymphadenopathy:     Cervical: No cervical adenopathy.  Skin:    General: Skin is warm and dry.     Capillary Refill: Capillary refill takes less than 2 seconds.     Findings: Erythema and rash present.     Comments: Has sandy feeling skin with erythematous base on L antecubital area, and no erythema on R antecubital region R shin has erythema and warmth from anterior ankle to 2/3 of shin, but is not circumferential. Does not have the sandy feel as the arm rash.  Neurological:     Mental Status: She is alert and oriented to person, place, and time.     Motor: No weakness.     Gait: Gait normal.  Psychiatric:        Mood and Affect: Mood normal.        Behavior: Behavior normal.        Thought Content: Thought content normal.        Judgment:  Judgment normal.     Assessment And Plan:    1. Elevated blood pressure reading- we will monitor this - CBC with Diff - CMP14 + Anion Gap - Lipid Profile  2. Rash- unknown etiology. Since the Benadryl is helping, I advised her to try Adding Zantac 150 mg bid. I explained to her I can place her on prednisone again.  - CBC with Diff - CMP14 + Anion Gap - TSH - T3, free - Allergens(96) Foods - ANA, IFA (with reflex)  3. Cellulitis of right anterior lower leg- acute. Placed on Doxy as noted  4. Obesity with alveolar hypoventilation and body mass index (BMI) of 40 or greater ([place])- chronic. I told her I can see her ones a month to help her be accountable in loosing wt.   FU for complete physical and pap in the next month.     Jaxin Fulfer RODRIGUEZ[place]TH[place], PA-C

## 2017-12-30 LAB — ALLERGENS(96) FOODS
Allergen Apple, IgE: <0.1 kU/L
Allergen Banana IgE: <0.1 kU/L
Allergen Barley IgE: <0.1 kU/L
Allergen Black Pepper IgE: <0.1 kU/L
Allergen Blueberry IgE: <0.1 kU/L
Allergen Broccoli: <0.1 kU/L
Allergen Cabbage IgE: <0.1 kU/L
Allergen Carrot IgE: <0.1 kU/L
Allergen Cauliflower IgE: <0.1 kU/L
Allergen Celery IgE: <0.1 kU/L
Allergen Cinnamon IgE: <0.1 kU/L
Allergen Coconut IgE: <0.1 kU/L
Allergen Corn, IgE: <0.1 kU/L
Allergen Cucumber IgE: <0.1 kU/L
Allergen Garlic IgE: <0.1 kU/L
Allergen Ginger IgE: <0.1 kU/L
Allergen Gluten IgE: <0.1 kU/L
Allergen Grape IgE: <0.1 kU/L
Allergen Grapefruit IgE: <0.1 kU/L
Allergen Green Bean IgE: <0.1 kU/L
Allergen Green Bell Pepper IgE: <0.1 kU/L
Allergen Green Pea IgE: <0.1 kU/L
Allergen Lamb IgE: <0.1 kU/L
Allergen Lettuce IgE: <0.1 kU/L
Allergen Lime IgE: <0.1 kU/L
Allergen Melon IgE: <0.1 kU/L
Allergen Oat IgE: <0.1 kU/L
Allergen Onion IgE: <0.1 kU/L
Allergen Pear IgE: <0.1 kU/L
Allergen Potato, White IgE: <0.1 kU/L
Allergen Rice IgE: <0.1 kU/L
Allergen Salmon IgE: <0.1 kU/L
Allergen Strawberry IgE: <0.1 kU/L
Allergen Sweet Potato IgE: <0.1 kU/L
Allergen Tomato, IgE: <0.1 kU/L
Allergen Turkey IgE: <0.1 kU/L
Allergen Watermelon IgE: <0.1 kU/L
Allergen, Peach f95: 0.22 kU/L — AB
Basil: <0.1 kU/L
Beef IgE: <0.1 kU/L
C074-IgE Gelatin: <0.1 kU/L
Chicken IgE: <0.1 kU/L
Chocolate/Cacao IgE: <0.1 kU/L
Clam IgE: <0.1 kU/L
Codfish IgE: <0.1 kU/L
Coffee: <0.1 kU/L
Cranberry IgE: <0.1 kU/L
Egg White IgE: <0.1 kU/L
F020-IgE Almond: <0.1 kU/L
F023-IgE Crab: <0.1 kU/L
F045-IgE Yeast: <0.1 kU/L
F076-IgE Alpha Lactalbumin: <0.1 kU/L
F077-IgE Beta Lactoglobulin: <0.1 kU/L
F078-IgE Casein: <0.1 kU/L
F080-IgE Lobster: <0.1 kU/L
F081-IgE Cheese, Cheddar Type: <0.1 kU/L
F089-IgE Mustard: <0.1 kU/L
F096-IgE Avocado: <0.1 kU/L
F202-IgE Cashew Nut: <0.1 kU/L
F214-IgE Spinach: <0.1 kU/L
F222-IgE Tea: <0.1 kU/L
F242-IgE Bing Cherry: <0.1 kU/L
F247-IgE Honey: 0.16 kU/L — AB
F261-IgE Asparagus: <0.1 kU/L
F262-IgE Eggplant: <0.1 kU/L
F265-IgE Cumin: <0.1 kU/L
F278-IgE Bayleaf (Laurel): <0.1 kU/L
F279-IgE Chili Pepper: <0.1 kU/L
F283-IgE Oregano: <0.1 kU/L
F300-IgE Goat's Milk: <0.1 kU/L
F342-IgE Olive, Black: <0.1 kU/L
F343-IgE Raspberry: <0.1 kU/L
Hops: <0.1 kU/L
IgE Egg (Yolk): <0.1 kU/L
Kidney Bean IgE: <0.1 kU/L
Lemon: 0.13 kU/L — AB
Lima Bean IgE: <0.1 kU/L
Malt: <0.1 kU/L
Mushroom IgE: <0.1 kU/L
Orange: <0.1 kU/L
Peanut IgE: <0.1 kU/L
Pineapple IgE: <0.1 kU/L
Pork IgE: <0.1 kU/L
Pumpkin IgE: <0.1 kU/L
Red Beet: <0.1 kU/L
Rye IgE: <0.1 kU/L
Scallop IgE: 0.18 kU/L — AB
Sesame Seed IgE: <0.1 kU/L
Shrimp IgE: <0.1 kU/L
Soybean IgE: <0.1 kU/L
Tuna: <0.1 kU/L
Vanilla: <0.1 kU/L
Walnut IgE: 0.22 kU/L — AB
Wheat IgE: <0.1 kU/L
Whey: <0.1 kU/L
White Bean IgE: <0.1 kU/L

## 2017-12-30 LAB — LIPID PANEL
Chol/HDL Ratio: 2.2 ratio (ref 0.0–4.4)
Cholesterol, Total: 245 mg/dL — ABNORMAL HIGH (ref 100–199)
HDL: 112 mg/dL (ref >39)
LDL Calculated: 122 mg/dL — ABNORMAL HIGH (ref 0–99)
Triglycerides: 55 mg/dL (ref 0–149)
VLDL Cholesterol Cal: 11 mg/dL (ref 5–40)

## 2017-12-30 LAB — CBC WITH DIFFERENTIAL/PLATELET
Basophils Absolute: 0 10*3/uL (ref 0.0–0.2)
Basos: 1 %
EOS (ABSOLUTE): 0.2 10*3/uL (ref 0.0–0.4)
Eos: 3 %
Hematocrit: 39.6 % (ref 34.0–46.6)
Hemoglobin: 13.6 g/dL (ref 11.1–15.9)
Immature Grans (Abs): 0 10*3/uL (ref 0.0–0.1)
Immature Granulocytes: 0 %
Lymphocytes Absolute: 1.8 10*3/uL (ref 0.7–3.1)
Lymphs: 29 %
MCH: 32.2 pg (ref 26.6–33.0)
MCHC: 34.3 g/dL (ref 31.5–35.7)
MCV: 94 fL (ref 79–97)
Monocytes Absolute: 0.4 10*3/uL (ref 0.1–0.9)
Monocytes: 7 %
Neutrophils Absolute: 3.7 10*3/uL (ref 1.4–7.0)
Neutrophils: 60 %
Platelets: 282 10*3/uL (ref 150–450)
RBC: 4.22 x10E6/uL (ref 3.77–5.28)
RDW: 13.8 % (ref 12.3–15.4)
WBC: 6.2 10*3/uL (ref 3.4–10.8)

## 2017-12-30 LAB — CMP14 + ANION GAP
ALT: 15 IU/L (ref 0–32)
AST: 12 IU/L (ref 0–40)
Albumin/Globulin Ratio: 1.6 (ref 1.2–2.2)
Albumin: 4.2 g/dL (ref 3.5–4.8)
Alkaline Phosphatase: 61 IU/L (ref 39–117)
Anion Gap: 14 mmol/L (ref 10.0–18.0)
BUN/Creatinine Ratio: 25 (ref 12–28)
BUN: 27 mg/dL (ref 8–27)
Bilirubin Total: 0.6 mg/dL (ref 0.0–1.2)
CO2: 24 mmol/L (ref 20–29)
Calcium: 9.8 mg/dL (ref 8.7–10.3)
Chloride: 101 mmol/L (ref 96–106)
Creatinine, Ser: 1.06 mg/dL — ABNORMAL HIGH (ref 0.57–1.00)
GFR calc Af Amer: 61 mL/min/{1.73_m2} (ref >59)
GFR calc non Af Amer: 53 mL/min/{1.73_m2} — ABNORMAL LOW (ref >59)
Globulin, Total: 2.6 g/dL (ref 1.5–4.5)
Glucose: 91 mg/dL (ref 65–99)
Potassium: 4.6 mmol/L (ref 3.5–5.2)
Sodium: 139 mmol/L (ref 134–144)
Total Protein: 6.8 g/dL (ref 6.0–8.5)

## 2017-12-30 LAB — TSH: TSH: 1.74 u[IU]/mL (ref 0.450–4.500)

## 2017-12-30 LAB — T3, FREE: T3, Free: 2.5 pg/mL (ref 2.0–4.4)

## 2018-01-04 ENCOUNTER — Other Ambulatory Visit: Payer: Self-pay | Admitting: Internal Medicine

## 2018-01-15 ENCOUNTER — Other Ambulatory Visit: Payer: Self-pay | Admitting: Internal Medicine

## 2018-01-15 LAB — ANTINUCLEAR ANTIBODIES, IFA: ANA Titer 1: NEGATIVE

## 2018-01-25 ENCOUNTER — Ambulatory Visit: Payer: Medicare Other | Admitting: Internal Medicine

## 2018-01-25 ENCOUNTER — Other Ambulatory Visit: Payer: Self-pay | Admitting: Internal Medicine

## 2018-01-25 ENCOUNTER — Other Ambulatory Visit: Payer: Self-pay

## 2018-01-25 ENCOUNTER — Encounter: Payer: Self-pay | Admitting: Internal Medicine

## 2018-01-25 VITALS — BP 122/70 | HR 88 | Temp 97.6°F | Ht 65.2 in | Wt 280.0 lb

## 2018-01-25 DIAGNOSIS — Z0000 Encounter for general adult medical examination without abnormal findings: Secondary | ICD-10-CM | POA: Diagnosis not present

## 2018-01-25 DIAGNOSIS — Z1211 Encounter for screening for malignant neoplasm of colon: Secondary | ICD-10-CM | POA: Diagnosis not present

## 2018-01-25 DIAGNOSIS — Z1239 Encounter for other screening for malignant neoplasm of breast: Secondary | ICD-10-CM

## 2018-01-25 DIAGNOSIS — Z124 Encounter for screening for malignant neoplasm of cervix: Secondary | ICD-10-CM

## 2018-01-25 DIAGNOSIS — R7309 Other abnormal glucose: Secondary | ICD-10-CM

## 2018-01-25 DIAGNOSIS — E2839 Other primary ovarian failure: Secondary | ICD-10-CM

## 2018-01-25 DIAGNOSIS — E7800 Pure hypercholesterolemia, unspecified: Secondary | ICD-10-CM

## 2018-01-25 DIAGNOSIS — I8393 Asymptomatic varicose veins of bilateral lower extremities: Secondary | ICD-10-CM

## 2018-01-25 DIAGNOSIS — E662 Morbid (severe) obesity with alveolar hypoventilation: Secondary | ICD-10-CM

## 2018-01-25 LAB — POCT URINALYSIS DIPSTICK
Blood, UA: NEGATIVE
Glucose, UA: NEGATIVE
Ketones, UA: NEGATIVE
Nitrite, UA: NEGATIVE
Protein, UA: POSITIVE — AB
Spec Grav, UA: 1.025 (ref 1.010–1.025)
Urobilinogen, UA: 0.2 E.U./dL
pH, UA: 5.5 (ref 5.0–8.0)

## 2018-01-25 LAB — POCT UA - MICROALBUMIN
Albumin/Creatinine Ratio, Urine, POC: 300
Creatinine, POC: 300 mg/dL
Microalbumin Ur, POC: 80 mg/L

## 2018-01-25 LAB — HEMOGLOBIN A1C
Est. average glucose Bld gHb Est-mCnc: 114 mg/dL
Hgb A1c MFr Bld: 5.6 % (ref 4.8–5.6)

## 2018-01-25 NOTE — Progress Notes (Signed)
Subjective:     Patient ID: Linda Allen , female    DOB: 31-Oct-1945 , 73 y.o.   MRN: [id number]   CC- yearly physical  HPI  Pt is here today for yearly physical. Last colonoscopy 3- 4 years ago. She usually awaits for Dr Lorie Apley notification.  She is due to have eye xm and is planning on making apt. Had dental check this month. Has not made apt for mammogram and dexa. Prior dexa has been normal. Has immunizations at her pharmacy.   Past Medical History:  Diagnosis Date  . Allergy   . Arthritis    L hip  . Hypertension   . Scleroderma ([place])      Family History  Problem Relation Age of Onset  . CVA Mother   . Parkinson's disease Father   . CVA Father   . Eczema Sister   . Ulcerative colitis Brother      Current Outpatient Medications:  .  aspirin 81 MG tablet, Take 81 mg by mouth daily., Disp: , Rfl:  .  doxycycline (VIBRA-TABS) 100 MG tablet, Take 1 tablet (100 mg total) by mouth 2 (two) times daily. (Patient not taking: Reported on 01/25/2018), Disp: 20 tablet, Rfl: 0 .  ranitidine (ZANTAC) 150 MG tablet, TAKE 1 TABLET BY MOUTH TWICE DAILY FOR 10 DAYS FOR ITCHY RASH (Patient not taking: Reported on 01/25/2018), Disp: 20 tablet, Rfl: 0   Allergies  Allergen Reactions  . Ivp Dye [Iodinated Diagnostic Agents]   . Penicillins Hives and Rash     Review of Systems  Constitutional: Negative.   HENT: Negative.   Eyes: Negative.   Respiratory: Negative.   Cardiovascular: Negative for leg swelling.  Gastrointestinal: Negative.   Endocrine: Negative.   Genitourinary: Negative.   Musculoskeletal: Negative.   Skin: Positive for color change and rash.       Rash is better. Has seen derm and he cant figure out what the rash is about. Thinks the leg is related to vascular causes.   Neurological: Negative.   Hematological: Negative.   Psychiatric/Behavioral: Negative.      Today's Vitals   01/25/18 0851  BP: 122/70  Pulse: 88  Temp: 97.6 F (36.4 C)  TempSrc: Oral   SpO2: 97%  Weight: 280 lb (127 kg)  Height: 5' 5.2" (1.656 m)   Body mass index is 46.31 kg/m.  Objective:  Physical Exam  BP 122/70 (BP Location: Left Arm, Patient Position: Sitting, Cuff Size: Large)   Pulse 88   Temp 97.6 F (36.4 C) (Oral)   Ht 5' 5.2" (1.656 m)   Wt 280 lb (127 kg)   SpO2 97%   BMI 46.31 kg/m   General Appearance:    Alert, cooperative, no distress, appears stated age  Head:    Normocephalic, without obvious abnormality, atraumatic  Eyes:    PERRL, conjunctiva/corneas clear, EOM's intact, fundi    benign, both eyes  Ears:    Normal TM's and external ear canals, both ears  Nose:   Nares normal, septum midline, mucosa normal, no drainage    or sinus tenderness  Throat:   Lips, mucosa, and tongue normal; teeth and gums normal  Neck:   Supple, symmetrical, trachea midline, no adenopathy;    thyroid:  no enlargement/tenderness/nodules; no carotid   Bruit.  Back:     Symmetric, no curvature, ROM normal, no CVA tenderness  Lungs:     Clear to auscultation bilaterally, respirations unlabored  Chest Wall:    No  tenderness or deformity   Heart:    Regular rate and rhythm, S1 and S2 normal,2-3/6 murmur.  Breast Exam:    No tenderness, masses, or nipple abnormality  Abdomen:     Soft, non-tender, bowel sounds active all four quadrants,    no masses, no organomegaly  Genitalia:    Normal female without lesion, discharge or tenderness  Rectal:    Normal tone, normal prostate, no masses or tenderness;   guaiac negative stool  Extremities:   Has frozen R shoulder. Has painful L hip and decreased movement due to pain, no cyanosis or edema. Has multiple varicosities on ankles and lower legs with skin color changes of venostasis.   Pulses:   2+ and symmetric all extremities  Skin:   Skin color, texture, turgor normal, no lesions. Rash from last visit on her arms seems resolved. Has hyperpigmented discoloration of ankles, distal shins and dorsal feet.   Lymph nodes:    Cervical, supraclavicular, and axillary nodes normal  Neurologic:   CNII-XII intact, normal strength, has hyporeflexia on upper and lower extremities. Had a hard time doing tandem gait due to L hip pain. [place]  And finger to nose is normal.   EKG is normal     Assessment And Plan:  1. General medical exam- routine. FU 1 month.   2. Abnormal glucose- past hx of being prediabetic - Hemoglobin A1c  3. Screening for cervical cancer- screen - Pap Lb, rfx HPV all pth  4. Screen for colon cancer- has apt for colonoscopy with Dr Collene Mares  5. Decreased estrogen level- chronic - DG DXA FRACTURE ASSESSMENT; Future  6. Screening for breast cancer- screen - MM Digital Screening; Future 7- Hypercholesteronemia- is working on low fat diet.   Fu 6 months, at that time will need to have lipids repeated. If has not lost weight I will offer her to see Dr Leafy Ro     She will call back with name of vascular MD and bring immunization records from her pharmacy.  Keeara Frees RODRIGUEZ-SOUTHWORTH, PA-C

## 2018-01-25 NOTE — Patient Instructions (Signed)
Preventive Care 42 Years and Older, Female Preventive care refers to lifestyle choices and visits with your health care provider that can promote health and wellness. What does preventive care include?  A yearly physical exam. This is also called an annual well check.  Dental exams once or twice a year.  Routine eye exams. Ask your health care provider how often you should have your eyes checked.  Personal lifestyle choices, including: ? Daily care of your teeth and gums. ? Regular physical activity. ? Eating a healthy diet. ? Avoiding tobacco and drug use. ? Limiting alcohol use. ? Practicing safe sex. ? Taking low-dose aspirin every day. ? Taking vitamin and mineral supplements as recommended by your health care provider. What happens during an annual well check? The services and screenings done by your health care provider during your annual well check will depend on your age, overall health, lifestyle risk factors, and family history of disease. Counseling Your health care provider may ask you questions about your:  Alcohol use.  Tobacco use.  Drug use.  Emotional well-being.  Home and relationship well-being.  Sexual activity.  Eating habits.  History of falls.  Memory and ability to understand (cognition).  Work and work Statistician.  Reproductive health.  Screening You may have the following tests or measurements:  Height, weight, and BMI.  Blood pressure.  Lipid and cholesterol levels. These may be checked every 5 years, or more frequently if you are over 30 years old.  Skin check.  Lung cancer screening. You may have this screening every year starting at age 27 if you have a 30-pack-year history of smoking and currently smoke or have quit within the past 15 years.  Colorectal cancer screening. All adults should have this screening starting at age 33 and continuing until age 46. You will have tests every 1-10 years, depending on your results and the  type of screening test. People at increased risk should start screening at an earlier age. Screening tests may include: ? Guaiac-based fecal occult blood testing. ? Fecal immunochemical test (FIT). ? Stool DNA test. ? Virtual colonoscopy. ? Sigmoidoscopy. During this test, a flexible tube with a tiny camera (sigmoidoscope) is used to examine your rectum and lower colon. The sigmoidoscope is inserted through your anus into your rectum and lower colon. ? Colonoscopy. During this test, a long, thin, flexible tube with a tiny camera (colonoscope) is used to examine your entire colon and rectum.  Hepatitis C blood test.  Hepatitis B blood test.  Sexually transmitted disease (STD) testing.  Diabetes screening. This is done by checking your blood sugar (glucose) after you have not eaten for a while (fasting). You may have this done every 1-3 years.  Bone density scan. This is done to screen for osteoporosis. You may have this done starting at age 37.  Mammogram. This may be done every 1-2 years. Talk to your health care provider about how often you should have regular mammograms. Talk with your health care provider about your test results, treatment options, and if necessary, the need for more tests. Vaccines Your health care provider may recommend certain vaccines, such as:  Influenza vaccine. This is recommended every year.  Tetanus, diphtheria, and acellular pertussis (Tdap, Td) vaccine. You may need a Td booster every 10 years.  Varicella vaccine. You may need this if you have not been vaccinated.  Zoster vaccine. You may need this after age 38.  Measles, mumps, and rubella (MMR) vaccine. You may need at least  one dose of MMR if you were born in 1957 or later. You may also need a second dose.  Pneumococcal 13-valent conjugate (PCV13) vaccine. One dose is recommended after age 24.  Pneumococcal polysaccharide (PPSV23) vaccine. One dose is recommended after age 24.  Meningococcal  vaccine. You may need this if you have certain conditions.  Hepatitis A vaccine. You may need this if you have certain conditions or if you travel or work in places where you may be exposed to hepatitis A.  Hepatitis B vaccine. You may need this if you have certain conditions or if you travel or work in places where you may be exposed to hepatitis B.  Haemophilus influenzae type b (Hib) vaccine. You may need this if you have certain conditions. Talk to your health care provider about which screenings and vaccines you need and how often you need them. This information is not intended to replace advice given to you by your health care provider. Make sure you discuss any questions you have with your health care provider. Document Released: 01/23/2015 Document Revised: 02/16/2017 Document Reviewed: 10/28/2014 Elsevier Interactive Patient Education  2019 [employer].

## 2018-01-25 NOTE — Progress Notes (Unsigned)
Please request colonoscopy records from Dr Collene Mares

## 2018-01-30 ENCOUNTER — Other Ambulatory Visit: Payer: Self-pay | Admitting: Internal Medicine

## 2018-01-30 DIAGNOSIS — I8393 Asymptomatic varicose veins of bilateral lower extremities: Secondary | ICD-10-CM

## 2018-01-30 NOTE — Progress Notes (Signed)
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 I tried looking for details of who the groups are and I could not find it, so she will call me back if the group I sent her to is them.

## 2018-02-01 ENCOUNTER — Other Ambulatory Visit: Payer: Self-pay | Admitting: Orthopedic Surgery

## 2018-02-01 DIAGNOSIS — M1612 Unilateral primary osteoarthritis, left hip: Secondary | ICD-10-CM

## 2018-02-01 LAB — CYTOLOGY - PAP
Diagnosis: NEGATIVE
HPV: NOT DETECTED

## 2018-03-02 ENCOUNTER — Other Ambulatory Visit: Payer: Self-pay

## 2018-03-02 DIAGNOSIS — Z992 Dependence on renal dialysis: Principal | ICD-10-CM

## 2018-03-02 DIAGNOSIS — N186 End stage renal disease: Secondary | ICD-10-CM

## 2018-03-06 ENCOUNTER — Other Ambulatory Visit: Payer: Self-pay

## 2018-03-06 DIAGNOSIS — I83893 Varicose veins of bilateral lower extremities with other complications: Secondary | ICD-10-CM

## 2018-03-08 ENCOUNTER — Encounter: Payer: Medicare Other | Admitting: Vascular Surgery

## 2018-03-08 ENCOUNTER — Other Ambulatory Visit (HOSPITAL_COMMUNITY): Payer: Medicare Other

## 2018-03-08 ENCOUNTER — Ambulatory Visit (HOSPITAL_COMMUNITY)
Admission: RE | Admit: 2018-03-08 | Discharge: 2018-03-08 | Disposition: A | Discharge status: Home / Self Care | Payer: Medicare Other | Source: Ambulatory Visit | Attending: Vascular Surgery | Admitting: Vascular Surgery

## 2018-03-08 ENCOUNTER — Encounter: Payer: Self-pay | Admitting: Vascular Surgery

## 2018-03-08 ENCOUNTER — Ambulatory Visit: Payer: Medicare Other | Admitting: Vascular Surgery

## 2018-03-08 ENCOUNTER — Other Ambulatory Visit: Payer: Self-pay

## 2018-03-08 ENCOUNTER — Encounter (HOSPITAL_COMMUNITY): Payer: Medicare Other

## 2018-03-08 VITALS — BP 171/84 | HR 68 | Resp 18 | Ht 65.2 in | Wt 275.6 lb

## 2018-03-08 DIAGNOSIS — I83893 Varicose veins of bilateral lower extremities with other complications: Secondary | ICD-10-CM

## 2018-03-08 DIAGNOSIS — I83813 Varicose veins of bilateral lower extremities with pain: Secondary | ICD-10-CM

## 2018-03-08 NOTE — Progress Notes (Signed)
Patient name: Linda Allen MRN: [id number] DOB: 11-13-1945 Sex: female  REASON FOR CONSULT: Bilateral lower extremity cellulitis  HPI: Linda Allen is a 73 y.o. female, with a several year history of redness occurring in the gaiter area of both legs.  This never completely resolves.  It gets worse at times.  She has worn some knee-high compression stockings in the past.  This did not really relieve all of her symptoms.  She also complains of achiness in the legs on both sides especially after she has been on her feet all day long.  Her calf area is also tender to palpation if someone touches this firmly.  He denies any previous ulcerations.  She has no prior history of DVT.  She does have a family history of varicose veins in her mother.  He has never had to have a course of antibiotics for the cellulitis.  She is trying to lose some weight.  Other medical problems include multi-joint arthritis, hypertension.  She is a retired Transport planner.  Past Medical History:  Diagnosis Date  . Allergy   . Arthritis    L hip  . Hypertension   . Scleroderma [facility])    Past Surgical History:  Procedure Laterality Date  . JOINT REPLACEMENT  2011, 2012   both knees  . SHOULDER ACROMIOPLASTY Right 2012    Family History  Problem Relation Age of Onset  . CVA Mother   . Parkinson's disease Father   . CVA Father   . Eczema Sister   . Ulcerative colitis Brother     SOCIAL HISTORY: Social History   Socioeconomic History  . Marital status: Married    Spouse name: Not on file  . Number of children: Not on file  . Years of education: Not on file  . Highest education level: Not on file  Occupational History  . Not on file  Social Needs  . Financial resource strain: Not on file  . Food insecurity:    Worry: Not on file    Inability: Not on file  . Transportation needs:    Medical: Not on file    Non-medical: Not on file  Tobacco Use  . Smoking status: Former Research scientist (life sciences)  . Smokeless  tobacco: Never Used  . Tobacco comment: smoked for 10 yeas, d/c 40 y ago  Substance and Sexual Activity  . Alcohol use: Not Currently  . Drug use: Never  . Sexual activity: Yes    Birth control/protection: None    Comment: number of sex partners in the last 12 months  1  Lifestyle  . Physical activity:    Days per week: Not on file    Minutes per session: Not on file  . Stress: Not on file  Relationships  . Social connections:    Talks on phone: Not on file    Gets together: Not on file    Attends religious service: Not on file    Active member of club or organization: Not on file    Attends meetings of clubs or organizations: Not on file    Relationship status: Not on file  . Intimate partner violence:    Fear of current or ex partner: Not on file    Emotionally abused: Not on file    Physically abused: Not on file    Forced sexual activity: Not on file  Other Topics Concern  . Not on file  Social History Narrative   Exercise walking 3 times per  week for 30 minutes.      Anticipatory guidance - has living will, full code, but would not want to be on ventilator for prolonged time. organ donor.              Allergies  Allergen Reactions  . Ivp Dye [Iodinated Diagnostic Agents]   . Penicillins Hives and Rash    Current Outpatient Medications  Medication Sig Dispense Refill  . aspirin 81 MG tablet Take 81 mg by mouth daily.     No current facility-administered medications for this visit.     ROS:   General:  No weight loss, Fever, chills  HEENT: No recent headaches, no nasal bleeding, no visual changes, no sore throat  Neurologic: No dizziness, blackouts, seizures. No recent symptoms of stroke or mini- stroke. No recent episodes of slurred speech, or temporary blindness.  Cardiac: No recent episodes of chest pain/pressure, no shortness of breath at rest.  No shortness of breath with exertion.  Denies history of atrial fibrillation or irregular  heartbeat  Vascular: No history of rest pain in feet.  No history of claudication.  No history of non-healing ulcer, No history of DVT   Pulmonary: No home oxygen, no productive cough, no hemoptysis,  No asthma or wheezing  Musculoskeletal:  [X]  Arthritis, [ ]  Low back pain,  [X]  Joint pain  Hematologic:No history of hypercoagulable state.  No history of easy bleeding.  No history of anemia  Gastrointestinal: No hematochezia or melena,  No gastroesophageal reflux, no trouble swallowing  Urinary: [ ]  chronic Kidney disease, [ ]  on HD - [ ]  MWF or [ ]  TTHS, [ ]  Burning with urination, [ ]  Frequent urination, [ ]  Difficulty urinating;   Skin: No rashes  Psychological: No history of anxiety,  No history of depression   Physical Examination  Vitals:   03/08/18 1340  BP: (!) 171/84  Pulse: 68  Resp: 18  SpO2: 100%  Weight: 275 lb 9.2 oz (125 kg)  Height: 5' 5.2" (1.656 m)    Body mass index is 45.58 kg/m.  General:  Alert and oriented, no acute distress HEENT: Normal Neck: No bruit or JVD Pulmonary: Clear to auscultation bilaterally Cardiac: Regular Rate and Rhythm without murmur Abdomen: Soft, non-tender, non-distended, no mass Skin: No rash, bilateral erythema gaiter region circumferentially as depicted in the picture below Extremity Pulses:  2+ radial, brachial, femoral, dorsalis pedis, posterior tibial pulses bilaterally Musculoskeletal: No deformity trace pretibial and ankle edema  Neurologic: Upper and lower extremity motor 5/5 and symmetric      DATA:  Patient had a venous duplex today which showed no evidence of DVT.  She did have reflux in the deep vein system on the right.  She also had reflux in the superficial system on the right with areas in the greater saphenous vein including the saphenofemoral junction.  Vein diameter was about 4 mm.  On the left side she also had reflux in the saphenofemoral junction and sections of the greater saphenous vein 4 mm  diameter.  ASSESSMENT:   Symptomatic varicose veins with skin changes and evidence of superficial venous reflux.  She is CEAP class IV   PLAN: Patient was given a prescription today for bilateral thigh-high compression stockings.  She will try these for the next 3 months.  If she has no significant improvement we will consider laser ablation of both greater saphenous veins.  I will perform a SonoSite ultrasound at her next office visit to make sure she does  not have significant tortuosity and has a reasonable vein for access for considering laser ablation if she wishes to pursue this.  Ruta Hinds, MD [facility] Office: [phone] Pager: [phone]

## 2018-03-17 ENCOUNTER — Other Ambulatory Visit (INDEPENDENT_AMBULATORY_CARE_PROVIDER_SITE_OTHER): Payer: Medicare Other

## 2018-03-17 DIAGNOSIS — Z0000 Encounter for general adult medical examination without abnormal findings: Secondary | ICD-10-CM

## 2018-04-04 DIAGNOSIS — M19012 Primary osteoarthritis, left shoulder: Secondary | ICD-10-CM | POA: Insufficient documentation

## 2018-05-30 ENCOUNTER — Ambulatory Visit: Payer: Medicare Other | Admitting: Vascular Surgery

## 2018-06-20 ENCOUNTER — Other Ambulatory Visit: Payer: Self-pay

## 2018-06-20 ENCOUNTER — Encounter: Payer: Self-pay | Admitting: Family

## 2018-06-20 ENCOUNTER — Ambulatory Visit (HOSPITAL_COMMUNITY)
Admission: RE | Admit: 2018-06-20 | Discharge: 2018-06-20 | Disposition: A | Discharge status: Home / Self Care | Payer: Medicare Other | Source: Ambulatory Visit | Attending: Family | Admitting: Family

## 2018-06-20 ENCOUNTER — Telehealth: Payer: Self-pay

## 2018-06-20 ENCOUNTER — Ambulatory Visit (INDEPENDENT_AMBULATORY_CARE_PROVIDER_SITE_OTHER): Payer: Medicare Other | Admitting: Family

## 2018-06-20 VITALS — BP 181/89 | HR 71 | Temp 98.1°F | Ht 68.0 in | Wt 276.8 lb

## 2018-06-20 DIAGNOSIS — M79662 Pain in left lower leg: Secondary | ICD-10-CM

## 2018-06-20 DIAGNOSIS — I872 Venous insufficiency (chronic) (peripheral): Secondary | ICD-10-CM

## 2018-06-20 DIAGNOSIS — M7989 Other specified soft tissue disorders: Secondary | ICD-10-CM

## 2018-06-20 NOTE — Progress Notes (Signed)
Established Venous Insufficiency  History of Present Illness  Linda Allen is a 73 y.o. (Feb 08, 1945) female whom Dr. Oneida Alar evaluated on  03-08-18. At that time venous duplex that day showed no evidence of DVT.  She had reflux in the deep vein system on the right.  She also had reflux in the superficial system on the right with areas in the greater saphenous vein including the saphenofemoral junction.  Vein diameter was about 4 mm. On the left side she  had reflux in the saphenofemoral junction and sections of the greater saphenous vein 4 mm diameter with a several year history of redness occurring in the gaiter area of both legs.  This never completely resolves.  It gets worse at times.  She has worn some knee-high compression stockings in the past.  This did not really relieve all of her symptoms.  She also complained of achiness in the legs on both sides especially after she has been on her feet all day long.  Her calf area was also tender to palpation if someone touched this firmly.  Symptomatic varicose veins with skin changes and evidence of superficial venous reflux.  She is CEAP class IV Dr. Oneida Alar gave pt a prescription for bilateral thigh-high compression stockings.  She was to try these for the next 3 months.  If she has no significant improvement we will consider laser ablation of both greater saphenous veins. Dr. Oneida Alar will perform a SonoSite ultrasound at her next office visit to make sure she does not have significant tortuosity and has a reasonable vein for access for considering laser ablation if she wishes to pursue this.  Pt returns today with c/o left lower leg becoming larger than her right lower leg, feels tight, in the last week. She has been wearing thigh high compression hose for the last 3 months. She denies dyspnea or chest pain. She denies cough. She denies fever or chills. She states that she has been treated for cellulitis of her legs in the past. She reports an  allergic reaction to PCN.  She smoked x 10 years, quit about 38 yeas ago.  She denies any cardiac problems, denies DM.  She has not been taking a daily ASA, no prescription medications.   She denies any previous ulcerations.  She has no prior history of DVT.  She does have a family history of varicose veins in her mother. She is trying to lose some weight.  Other medical problems include multi-joint arthritis and hypertension. She states that she needs a left hip replacement. She is a retired Transport planner.   Past Medical History:  Diagnosis Date  . Allergy   . Arthritis    L hip  . Hypertension   . Scleroderma [facility])     Social History Social History   Tobacco Use  . Smoking status: Former Research scientist (life sciences)  . Smokeless tobacco: Never Used  . Tobacco comment: smoked for 10 yeas, d/c 40 y ago  Substance Use Topics  . Alcohol use: Not Currently  . Drug use: Never    Family History Family History  Problem Relation Age of Onset  . CVA Mother   . Parkinson's disease Father   . CVA Father   . Eczema Sister   . Ulcerative colitis Brother     Surgical History Past Surgical History:  Procedure Laterality Date  . JOINT REPLACEMENT  2011, 2012   both knees  . SHOULDER ACROMIOPLASTY Right 2012    Allergies  Allergen Reactions  .  Ivp Dye [Iodinated Diagnostic Agents]   . Penicillins Hives and Rash    Current Outpatient Medications  Medication Sig Dispense Refill  . aspirin 81 MG tablet Take 81 mg by mouth daily.     No current facility-administered medications for this visit.     REVIEW OF SYSTEMS: see HPI for pertinent positives and negatives   Physical Examination  Vitals:   06/20/18 1522  BP: (!) 181/89  Pulse: 71  Temp: 98.1 F (36.7 C)  TempSrc: Temporal  SpO2: 99%  Weight: 276 lb 12.8 oz (125.6 kg)  Height: 5\' 8"  (1.727 m)   Body mass index is 42.09 kg/m.  General: Morbidly obese female appears her stated age.   HEENT:  No gross abnormalities Pulmonary:  Respirations are non-labored, distant breath sounds, no rales, rhonchi, or wheezes.  Abdomen: Soft and non-tender with normal pitched bowel sounds.  Musculoskeletal: There are no major deformities.   Neurologic: No focal weakness or paresthesias are detected. Skin: There are no ulcers or rashes noted. Mild erythema at both lower legs in a gaitor pattern.  Psychiatric: The patient has normal affect. Cardiovascular: There is a regular rate and rhythm without significant murmur appreciated.  Left lower leg appears slightly larger than right, somewhat tender to deep palpation. Bilateral pedal pulses are palpable.     Vascular: Vessel Right Left  Radial Palpable Palpable  Carotid  without bruit  without bruit  Aorta Not palpable N/A  Popliteal Not palpable Not palpable  PT Palpable Palpable  DP Palpable Palpable     DATA  Left LE Venous Duplex (Date: 06/20/2018):   RIGHTCompressibilityPhasicitySpontaneityPropertiesSummary +-----+---------------+---------+-----------+----------+-------+ CFV  Full                                                 +-----+---------------+---------+-----------+----------+-------+  +---------+---------------+---------+-----------+----------+-------+ LEFT     CompressibilityPhasicitySpontaneityPropertiesSummary +---------+---------------+---------+-----------+----------+-------+ CFV      Full                    Yes                          +---------+---------------+---------+-----------+----------+-------+ SFJ      Full                    Yes                          +---------+---------------+---------+-----------+----------+-------+ FV Prox  Full                    Yes                          +---------+---------------+---------+-----------+----------+-------+ FV Mid   Full                    Yes                          +---------+---------------+---------+-----------+----------+-------+ FV  DistalFull                    Yes                          +---------+---------------+---------+-----------+----------+-------+ POP  Full                    Yes                          +---------+---------------+---------+-----------+----------+-------+ PTV      Full                    Yes                          +---------+---------------+---------+-----------+----------+-------+ PERO     Full                    Yes                          +---------+---------------+---------+-----------+----------+-------+ GSV      Full                    Yes                          +---------+---------------+---------+-----------+----------+-------+ SSV      Full                    Yes                          +---------+---------------+---------+-----------+----------+-------+  The lateral aspect of the calf was interrogated and all of the identified veins were compressible and appeared to originate from an incompetent superficial vein which courses to the posterolateral aspect of the pelvis. Summary: Right: No evidence of common femoral vein obstruction. Left: There is no evidence of deep vein thrombosis in the lower extremity. There is no evidence of superficial venous thrombosis. No cystic structure found in the popliteal fossa.   Medical Decision Making  Linda Allen is a 72 y.o. female who presents with: 1 week history of left lower leg becoming larger than the right lower leg, left lower leg feeling tight. No ulcers. Has continued mild erythema at the anterior aspect of both lower legs, just proximal to both ankles.  Left lower leg swelling and tight feeling x 1 week, likely worsening venous reflux, negative DVT study today of left leg.   She has been wearing thigh high compression hose for 3 months. Follow up tomorrow with Dr. Oneida Alar to discuss possible  laser ablation of both greater saphenous veins.   Clemon Chambers, RN, MSN, FNP-C  [facility] [place] Office: [phone]  06/20/2018, 4:07 PM  Clinic MD: Laqueta Due

## 2018-06-20 NOTE — Telephone Encounter (Signed)
Pt called and said that she was suppose to have a visit in March for a follow up and she knows that they are working on getting these set back up but she is having severe swelling in her left leg.   Called pt and she said that her left calf is swelling quite significantly and is in pain. She denies any injury. Does not have a history of clots but concerned.   Appt made for pt to be seen today.   York Cerise, CMA

## 2018-06-21 ENCOUNTER — Other Ambulatory Visit: Payer: Self-pay

## 2018-06-21 ENCOUNTER — Encounter: Payer: Self-pay | Admitting: Vascular Surgery

## 2018-06-21 ENCOUNTER — Ambulatory Visit: Payer: Medicare Other | Admitting: Vascular Surgery

## 2018-06-21 VITALS — BP 152/84 | HR 77 | Resp 20 | Ht 68.0 in | Wt 276.0 lb

## 2018-06-21 DIAGNOSIS — I83813 Varicose veins of bilateral lower extremities with pain: Secondary | ICD-10-CM | POA: Diagnosis not present

## 2018-06-22 NOTE — Progress Notes (Signed)
Patient is a 73 year old female who returns for follow-up today.  I previously saw her in February 2020 where she complained of intermittent redness occurring in the gaiter area of both lower extremities.  This is been constant and chronic for years.  It occasionally gets worse.  Recently she has had more swelling and pain in the left calf area.  She has been compliant wearing lower extremity compression stockings since I saw her in February of this year.  She has not really had relief of her symptoms.  She also complains of aching heaviness fullness in her legs after being on her feet all day.  The left calf is very tender to palpation when things brushed across it.  She denies any skin breakdown.  She has no prior history of DVT.  She does have a family history of varicose veins in her mother.  She is continuing to try to lose weight.  She has multi-joint arthritis hypertension both of which are currently controlled.  She is trying to lose enough weight to qualify for a left hip replacement.  Review of systems: She denies shortness of breath.  She denies chest pain.  Physical exam:  Vitals:   06/21/18 1517  BP: (!) 152/84  Pulse: 77  Resp: 20  SpO2: 96%  Weight: 276 lb (125.2 kg)  Height: 5\' 8"  (1.727 m)    Extremities: 2+ dorsalis pedis pulses bilaterally  Skin: Brawny discoloration and erythema as depicted below      Data: I examined the patient's lower extremities today with a SonoSite ultrasound machine.  Greater saphenous vein was about 4 mm in diameter fairly uniform way to the saphenofemoral junction.  Previous duplex ultrasound had showed diffuse reflux in the greater saphenous and deep vein system bilaterally.  Assessment: Symptomatic varicose veins with pain and skin changes CEAP class IV  Plan: I discussed the patient today laser ablation of her left followed by her right greater saphenous vein for some improvement of her symptoms.  I did discuss with her that her weight loss  would also contribute to improving her symptoms as well as the fact that the laser ablation would not completely resolve all of her symptoms due to the fact that she has deep vein reflux as well.  She understands and wishes to proceed.  Risk benefits possible complications and procedure details were discussed today including but not limited to bleeding infection deep vein thrombosis.  We will schedule this pending insurance approval.  Ruta Hinds, MD [facility] [place] Office: [phone] Pager: [phone]

## 2018-07-03 ENCOUNTER — Other Ambulatory Visit: Payer: Self-pay | Admitting: *Deleted

## 2018-07-03 DIAGNOSIS — I83813 Varicose veins of bilateral lower extremities with pain: Secondary | ICD-10-CM

## 2018-07-26 ENCOUNTER — Ambulatory Visit: Payer: Medicare Other | Admitting: Internal Medicine

## 2018-07-26 ENCOUNTER — Ambulatory Visit: Payer: Medicare Other

## 2018-09-13 ENCOUNTER — Telehealth: Payer: Self-pay

## 2018-09-13 ENCOUNTER — Ambulatory Visit: Payer: Medicare Other | Admitting: Internal Medicine

## 2018-09-13 ENCOUNTER — Ambulatory Visit: Payer: Medicare Other

## 2018-09-13 NOTE — Telephone Encounter (Signed)
This nurse attempted to call patient in regards to no show for today's AWV visit. Message was left for patient to call back in order to reschedule.

## 2018-09-26 ENCOUNTER — Encounter: Payer: Self-pay | Admitting: Vascular Surgery

## 2018-09-26 ENCOUNTER — Ambulatory Visit: Payer: Medicare Other | Admitting: Vascular Surgery

## 2018-09-26 ENCOUNTER — Telehealth: Payer: Self-pay | Admitting: Internal Medicine

## 2018-09-26 ENCOUNTER — Other Ambulatory Visit: Payer: Self-pay

## 2018-09-26 VITALS — BP 167/90 | HR 82 | Temp 97.6°F | Resp 16 | Ht 68.0 in | Wt 265.0 lb

## 2018-09-26 DIAGNOSIS — I83812 Varicose veins of left lower extremities with pain: Secondary | ICD-10-CM

## 2018-09-26 HISTORY — PX: ENDOVENOUS ABLATION SAPHENOUS VEIN W/ LASER: SUR449

## 2018-09-26 NOTE — Telephone Encounter (Signed)
I left a message asking the pt to call me at [phone] to reschedule AWV/OV that was missed on 9/3.

## 2018-09-26 NOTE — Progress Notes (Signed)
     Laser Ablation Procedure    Date: 09/26/2018   Linda Allen DOB:Jun 12, 1945  Consent signed: Yes    Surgeon: Ruta Hinds MD   Procedure: Laser Ablation: left Greater Saphenous Vein  BP (!) 167/90 (BP Location: Left Arm, Patient Position: Sitting, Cuff Size: Large)   Pulse 82   Temp 97.6 F (36.4 C) (Temporal)   Resp 16   Ht 5\' 8"  (1.727 m)   Wt 265 lb (120.2 kg)   SpO2 100%   BMI 40.29 kg/m   Tumescent Anesthesia: 600 cc 0.9% NaCl with 50 cc Lidocaine HCL 1%  and 15 cc 8.4% NaHCO3  Local Anesthesia: 8 cc Lidocaine HCL and NaHCO3 (ratio 2:1)  15 watts continuous mode        Total energy: 2007 Joules   Total time: 2:13  Laser Fiber Ref. # [id number]      Lot # [id number]     Patient tolerated procedure well  Notes: Patient wore face mask.  All staff members wore facial masks and facial shields/goggles.    Description of Procedure:  After marking the course of the secondary varicosities, the patient was placed on the operating table in the supine position, and the left leg was prepped and draped in sterile fashion.   Local anesthetic was administered and under ultrasound guidance the saphenous vein was accessed with a micro needle and guide wire; then the mirco puncture sheath was placed.  A guide wire was inserted saphenofemoral junction , followed by a 5 french sheath.  The position of the sheath and then the laser fiber below the junction was confirmed using the ultrasound.  Tumescent anesthesia was administered along the course of the saphenous vein using ultrasound guidance. The patient was placed in Trendelenburg position and protective laser glasses were placed on patient and staff, and the laser was fired at 15 watts continuous mode advancing 1-40mm/second for a total of 2007 joules.     Steri strip was applied to the IV insertion site and ABD pads and thigh high compression stockings were applied.  Ace wrap bandages were applied at the top of the  saphenofemoral junction. Blood loss was less than 15 cc.  Discharge instructions reviewed with patient and hardcopy of discharge instructions given to patient to take home. The patient ambulated out of the operating room having tolerated the procedure well.  Ruta Hinds, MD [facility] [place] Office: [phone] Pager: [phone]

## 2018-10-03 ENCOUNTER — Encounter: Payer: Self-pay | Admitting: Vascular Surgery

## 2018-10-03 ENCOUNTER — Other Ambulatory Visit: Payer: Self-pay

## 2018-10-03 ENCOUNTER — Ambulatory Visit (INDEPENDENT_AMBULATORY_CARE_PROVIDER_SITE_OTHER): Payer: Self-pay | Admitting: Vascular Surgery

## 2018-10-03 ENCOUNTER — Ambulatory Visit (HOSPITAL_COMMUNITY)
Admission: RE | Admit: 2018-10-03 | Discharge: 2018-10-03 | Disposition: A | Discharge status: Home / Self Care | Payer: Medicare Other | Source: Ambulatory Visit | Attending: Family | Admitting: Family

## 2018-10-03 VITALS — BP 181/94 | HR 78 | Temp 98.0°F | Resp 18 | Ht 68.0 in | Wt 265.0 lb

## 2018-10-03 DIAGNOSIS — I83813 Varicose veins of bilateral lower extremities with pain: Secondary | ICD-10-CM

## 2018-10-03 DIAGNOSIS — I83812 Varicose veins of left lower extremities with pain: Secondary | ICD-10-CM

## 2018-10-03 NOTE — Progress Notes (Signed)
Patient is a 73 year old female who returns for postoperative follow-up today after recent laser ablation of her left greater saphenous vein.  She still has some mild pain around the area of laser ablation.  Otherwise she is doing well.  She is continue to wear her compression stockings.  She did have a skin reaction at the top of her thigh which either was due to swelling or some reaction to the material.  This seems to be healing.  Otherwise her incision is healing well.  He has no shortness of breath.  She has no chest pain  Physical exam:  Vitals:   10/03/18 1030  BP: (!) 181/94  Pulse: 78  Resp: 18  Temp: 98 F (36.7 C)  TempSrc: Temporal  SpO2: 98%  Weight: 265 lb (120.2 kg)  Height: 5\' 8"  (1.727 m)    Left lower extremity: Mildly tender to palpation no significant ecchymosis no hematoma  Data: Patient had a duplex ultrasound today which shows no evidence of DVT in the left leg.  So there is successful closure of the left greater saphenous vein.  Assessment: Patient doing well status post laser ablation left greater saphenous vein.  Plan: Patient is scheduled for laser ablation of her right leg on October 17, 2018.  Ruta Hinds, MD [facility] [place] Office: [phone] Pager: [phone]

## 2018-10-17 ENCOUNTER — Ambulatory Visit: Payer: Medicare Other | Admitting: Vascular Surgery

## 2018-10-17 ENCOUNTER — Other Ambulatory Visit: Payer: Self-pay

## 2018-10-17 ENCOUNTER — Encounter: Payer: Self-pay | Admitting: Vascular Surgery

## 2018-10-17 VITALS — BP 165/86 | HR 92 | Temp 97.3°F | Resp 16 | Ht 68.0 in | Wt 265.0 lb

## 2018-10-17 DIAGNOSIS — I83811 Varicose veins of right lower extremities with pain: Secondary | ICD-10-CM | POA: Diagnosis not present

## 2018-10-17 HISTORY — PX: ENDOVENOUS ABLATION SAPHENOUS VEIN W/ LASER: SUR449

## 2018-10-17 NOTE — Progress Notes (Signed)
     Laser Ablation Procedure    Date: 10/17/2018   Linda Allen DOB:1945-08-14  Consent signed: Yes    Surgeon: Ruta Hinds MD   Procedure: Laser Ablation: right Greater Saphenous Vein  BP (!) 165/86 (BP Location: Right Arm, Patient Position: Sitting, Cuff Size: Large)   Pulse 92   Temp (!) 97.3 F (36.3 C) (Temporal)   Resp 16   Ht 5\' 8"  (1.727 m)   Wt 265 lb (120.2 kg)   SpO2 100%   BMI 40.29 kg/m   Tumescent Anesthesia: 300 cc 0.9% NaCl with 50 cc Lidocaine HCL 1%  and 15 cc 8.4% NaHCO3  Local Anesthesia: 7.5 cc Lidocaine HCL and NaHCO3 (ratio 2:1)  15 watts continuous mode        Total energy: 2316 Joules    Total time: 2:34  Laser Fiber Ref. # [id number]      Lot # [id number]     Patient tolerated procedure well  Notes: Patient wore face mask.  All staff members wore facial masks and facial shields/goggles.    Description of Procedure:  After marking the course of the secondary varicosities, the patient was placed on the operating table in the supine position, and the right leg was prepped and draped in sterile fashion.   Local anesthetic was administered and under ultrasound guidance the saphenous vein was accessed with a micro needle and guide wire; then the mirco puncture sheath was placed.  A guide wire was inserted saphenofemoral junction , followed by a 5 french sheath.  The position of the sheath and then the laser fiber below the junction was confirmed using the ultrasound.  Tumescent anesthesia was administered along the course of the saphenous vein using ultrasound guidance. The patient was placed in Trendelenburg position and protective laser glasses were placed on patient and staff, and the laser was fired at 15 watts continuous mode advancing 1-27mm/second for a total of 2316 joules.       Steri strip was applied to the IV insertion site and ABD pads and thigh high compression stockings were applied.  Ace wrap bandages were applied at the top of the  saphenofemoral junction. Blood loss was less than 15 cc.  Discharge instructions reviewed with patient and hardcopy of discharge instructions given to patient to take home. The patient ambulated out of the operating room having tolerated the procedure well.  Ruta Hinds, MD [facility] [place] Office: [phone] Pager: [phone]

## 2018-10-18 ENCOUNTER — Telehealth: Payer: Self-pay | Admitting: Internal Medicine

## 2018-10-18 NOTE — Telephone Encounter (Signed)
I left a message asking the patient to call and reschedule AWV/OV.

## 2018-10-22 ENCOUNTER — Encounter: Payer: Self-pay | Admitting: Vascular Surgery

## 2018-10-23 ENCOUNTER — Telehealth (HOSPITAL_COMMUNITY): Payer: Self-pay

## 2018-10-23 NOTE — Telephone Encounter (Signed)

## 2018-10-24 ENCOUNTER — Encounter: Payer: Self-pay | Admitting: Vascular Surgery

## 2018-10-24 ENCOUNTER — Ambulatory Visit (HOSPITAL_COMMUNITY)
Admission: RE | Admit: 2018-10-24 | Discharge: 2018-10-24 | Disposition: A | Discharge status: Home / Self Care | Payer: Medicare Other | Source: Ambulatory Visit | Attending: Family | Admitting: Family

## 2018-10-24 ENCOUNTER — Other Ambulatory Visit: Payer: Self-pay

## 2018-10-24 ENCOUNTER — Ambulatory Visit (INDEPENDENT_AMBULATORY_CARE_PROVIDER_SITE_OTHER): Payer: Self-pay | Admitting: Vascular Surgery

## 2018-10-24 VITALS — BP 168/83 | HR 75

## 2018-10-24 DIAGNOSIS — I83813 Varicose veins of bilateral lower extremities with pain: Secondary | ICD-10-CM | POA: Insufficient documentation

## 2018-10-24 NOTE — Progress Notes (Signed)
Patient is a 73 year old female who returns for postoperative follow-up today.  She previously underwent laser ablation of her left greater saphenous vein.  She subsequently underwent laser ablation of her right greater saphenous vein.  This was done for recurrent episodes of swelling and cellulitis and pain.  She reports the second procedure was much better tolerated.  She has minimal pain at this point.  She is continuing to wear her compression stockings.  Data: Patient had duplex ultrasound today which showed no evidence of DVT successful closure of the greater saphenous vein bilaterally.  Assessment: Doing well status post bilateral greater saphenous laser ablation.  Plan: The patient will continue to wear her compression stockings as much as possible.  She will follow-up on an as-needed basis.  Ruta Hinds, MD [facility] [place] Office: [phone] Pager: [phone]

## 2018-11-01 ENCOUNTER — Telehealth: Payer: Self-pay | Admitting: Internal Medicine

## 2018-11-01 NOTE — Telephone Encounter (Signed)
I left a message asking the pt to call and schedule AWV and follow up OV.

## 2018-11-06 ENCOUNTER — Telehealth: Payer: Self-pay | Admitting: Internal Medicine

## 2018-11-06 NOTE — Telephone Encounter (Signed)
I left a message asking the patient to call and schedule AWV with Nickeah.

## 2018-11-14 ENCOUNTER — Telehealth: Payer: Self-pay | Admitting: Internal Medicine

## 2018-11-14 NOTE — Telephone Encounter (Signed)
I left a message asking the pt to call and schedule AWV with Nickeah. VDM (DD)

## 2018-11-30 ENCOUNTER — Telehealth: Payer: Self-pay | Admitting: Internal Medicine

## 2018-11-30 NOTE — Telephone Encounter (Signed)
Left msg asking pt to call and reschedule appt

## 2018-12-11 ENCOUNTER — Encounter: Payer: Self-pay | Admitting: Vascular Surgery

## 2018-12-20 ENCOUNTER — Telehealth: Payer: Self-pay | Admitting: Internal Medicine

## 2018-12-20 NOTE — Telephone Encounter (Signed)
I left a message asking the pt to call and schedule appt. °

## 2019-01-22 ENCOUNTER — Telehealth: Payer: Self-pay | Admitting: Internal Medicine

## 2019-01-22 NOTE — Telephone Encounter (Signed)
I called the patient to schedule AWV & OV.  She said that she can't come right now because of COVID and she needs a hip replacement.  She said that she'll call back to schedule.

## 2019-06-13 ENCOUNTER — Encounter: Payer: Self-pay | Admitting: Internal Medicine

## 2019-06-13 ENCOUNTER — Ambulatory Visit (INDEPENDENT_AMBULATORY_CARE_PROVIDER_SITE_OTHER): Payer: Medicare PPO

## 2019-06-13 ENCOUNTER — Other Ambulatory Visit: Payer: Self-pay

## 2019-06-13 ENCOUNTER — Ambulatory Visit (INDEPENDENT_AMBULATORY_CARE_PROVIDER_SITE_OTHER): Payer: Medicare PPO | Admitting: Internal Medicine

## 2019-06-13 VITALS — BP 144/86 | HR 60 | Temp 98.3°F | Ht 68.0 in | Wt 253.6 lb

## 2019-06-13 VITALS — BP 154/92 | HR 60 | Temp 98.3°F | Ht 68.0 in | Wt 253.0 lb

## 2019-06-13 DIAGNOSIS — Z0000 Encounter for general adult medical examination without abnormal findings: Secondary | ICD-10-CM | POA: Diagnosis not present

## 2019-06-13 DIAGNOSIS — I10 Essential (primary) hypertension: Secondary | ICD-10-CM

## 2019-06-13 DIAGNOSIS — Z1159 Encounter for screening for other viral diseases: Secondary | ICD-10-CM

## 2019-06-13 DIAGNOSIS — R809 Proteinuria, unspecified: Secondary | ICD-10-CM

## 2019-06-13 DIAGNOSIS — Z1231 Encounter for screening mammogram for malignant neoplasm of breast: Secondary | ICD-10-CM | POA: Diagnosis not present

## 2019-06-13 DIAGNOSIS — Z0001 Encounter for general adult medical examination with abnormal findings: Secondary | ICD-10-CM | POA: Diagnosis not present

## 2019-06-13 DIAGNOSIS — M1990 Unspecified osteoarthritis, unspecified site: Secondary | ICD-10-CM

## 2019-06-13 DIAGNOSIS — R011 Cardiac murmur, unspecified: Secondary | ICD-10-CM

## 2019-06-13 LAB — POCT URINALYSIS DIPSTICK
Blood, UA: NEGATIVE
Glucose, UA: NEGATIVE
Ketones, UA: NEGATIVE
Leukocytes, UA: NEGATIVE
Nitrite, UA: NEGATIVE
Protein, UA: POSITIVE — AB
Spec Grav, UA: >=1.03 — AB (ref 1.010–1.025)
Urobilinogen, UA: 0.2 E.U./dL
pH, UA: 5.5 (ref 5.0–8.0)

## 2019-06-13 LAB — POCT UA - MICROALBUMIN
Creatinine, POC: 300 mg/dL
Microalbumin Ur, POC: 150 mg/L

## 2019-06-13 NOTE — Patient Instructions (Signed)
[name] , Thank you for taking time to come for your Medicare Wellness Visit. I appreciate your ongoing commitment to your health goals. Please review the following plan we discussed and let me know if I can assist you in the future.   Screening recommendations/referrals: Colonoscopy: held until after hip surgery Mammogram: referred Bone Density: states had Recommended yearly ophthalmology/optometry visit for glaucoma screening and checkup Recommended yearly dental visit for hygiene and checkup  Vaccinations: Influenza vaccine: decline Pneumococcal vaccine: held due to covid vaccine Tdap vaccine: 08/2009 Shingles vaccine: discussed    Advanced directives: Please bring a copy of your POA (Power of [place]) and/or Living Will to your next appointment.   Conditions/risks identified: obesity  Next appointment: 06/24/2020 at 2:00   Preventive Care 72 Years and Older, Female Preventive care refers to lifestyle choices and visits with your health care provider that can promote health and wellness. What does preventive care include?  A yearly physical exam. This is also called an annual well check.  Dental exams once or twice a year.  Routine eye exams. Ask your health care provider how often you should have your eyes checked.  Personal lifestyle choices, including:  Daily care of your teeth and gums.  Regular physical activity.  Eating a healthy diet.  Avoiding tobacco and drug use.  Limiting alcohol use.  Practicing safe sex.  Taking low-dose aspirin every day.  Taking vitamin and mineral supplements as recommended by your health care provider. What happens during an annual well check? The services and screenings done by your health care provider during your annual well check will depend on your age, overall health, lifestyle risk factors, and family history of disease. Counseling  Your health care provider may ask you questions about your:  Alcohol  use.  Tobacco use.  Drug use.  Emotional well-being.  Home and relationship well-being.  Sexual activity.  Eating habits.  History of falls.  Memory and ability to understand (cognition).  Work and work Statistician.  Reproductive health. Screening  You may have the following tests or measurements:  Height, weight, and BMI.  Blood pressure.  Lipid and cholesterol levels. These may be checked every 5 years, or more frequently if you are over 22 years old.  Skin check.  Lung cancer screening. You may have this screening every year starting at age 58 if you have a 30-pack-year history of smoking and currently smoke or have quit within the past 15 years.  Fecal occult blood test (FOBT) of the stool. You may have this test every year starting at age 46.  Flexible sigmoidoscopy or colonoscopy. You may have a sigmoidoscopy every 5 years or a colonoscopy every 10 years starting at age 25.  Hepatitis C blood test.  Hepatitis B blood test.  Sexually transmitted disease (STD) testing.  Diabetes screening. This is done by checking your blood sugar (glucose) after you have not eaten for a while (fasting). You may have this done every 1-3 years.  Bone density scan. This is done to screen for osteoporosis. You may have this done starting at age 27.  Mammogram. This may be done every 1-2 years. Talk to your health care provider about how often you should have regular mammograms. Talk with your health care provider about your test results, treatment options, and if necessary, the need for more tests. Vaccines  Your health care provider may recommend certain vaccines, such as:  Influenza vaccine. This is recommended every year.  Tetanus, diphtheria, and acellular pertussis (Tdap,  Td) vaccine. You may need a Td booster every 10 years.  Zoster vaccine. You may need this after age 63.  Pneumococcal 13-valent conjugate (PCV13) vaccine. One dose is recommended after age  25.  Pneumococcal polysaccharide (PPSV23) vaccine. One dose is recommended after age 35. Talk to your health care provider about which screenings and vaccines you need and how often you need them. This information is not intended to replace advice given to you by your health care provider. Make sure you discuss any questions you have with your health care provider. Document Released: 01/23/2015 Document Revised: 09/16/2015 Document Reviewed: 10/28/2014 Elsevier Interactive Patient Education  2017 [place] Prevention in the Home Falls can cause injuries. They can happen to people of all ages. There are many things you can do to make your home safe and to help prevent falls. What can I do on the outside of my home?  Regularly fix the edges of walkways and driveways and fix any cracks.  Remove anything that might make you trip as you walk through a door, such as a raised step or threshold.  Trim any bushes or trees on the path to your home.  Use bright outdoor lighting.  Clear any walking paths of anything that might make someone trip, such as rocks or tools.  Regularly check to see if handrails are loose or broken. Make sure that both sides of any steps have handrails.  Any raised decks and porches should have guardrails on the edges.  Have any leaves, snow, or ice cleared regularly.  Use sand or salt on walking paths during winter.  Clean up any spills in your garage right away. This includes oil or grease spills. What can I do in the bathroom?  Use night lights.  Install grab bars by the toilet and in the tub and shower. Do not use towel bars as grab bars.  Use non-skid mats or decals in the tub or shower.  If you need to sit down in the shower, use a plastic, non-slip stool.  Keep the floor dry. Clean up any water that spills on the floor as soon as it happens.  Remove soap buildup in the tub or shower regularly.  Attach bath mats securely with double-sided  non-slip rug tape.  Do not have throw rugs and other things on the floor that can make you trip. What can I do in the bedroom?  Use night lights.  Make sure that you have a light by your bed that is easy to reach.  Do not use any sheets or blankets that are too big for your bed. They should not hang down onto the floor.  Have a firm chair that has side arms. You can use this for support while you get dressed.  Do not have throw rugs and other things on the floor that can make you trip. What can I do in the kitchen?  Clean up any spills right away.  Avoid walking on wet floors.  Keep items that you use a lot in easy-to-reach places.  If you need to reach something above you, use a strong step stool that has a grab bar.  Keep electrical cords out of the way.  Do not use floor polish or wax that makes floors slippery. If you must use wax, use non-skid floor wax.  Do not have throw rugs and other things on the floor that can make you trip. What can I do with my stairs?  Do not  leave any items on the stairs.  Make sure that there are handrails on both sides of the stairs and use them. Fix handrails that are broken or loose. Make sure that handrails are as long as the stairways.  Check any carpeting to make sure that it is firmly attached to the stairs. Fix any carpet that is loose or worn.  Avoid having throw rugs at the top or bottom of the stairs. If you do have throw rugs, attach them to the floor with carpet tape.  Make sure that you have a light switch at the top of the stairs and the bottom of the stairs. If you do not have them, ask someone to add them for you. What else can I do to help prevent falls?  Wear shoes that:  Do not have high heels.  Have rubber bottoms.  Are comfortable and fit you well.  Are closed at the toe. Do not wear sandals.  If you use a stepladder:  Make sure that it is fully opened. Do not climb a closed stepladder.  Make sure that both  sides of the stepladder are locked into place.  Ask someone to hold it for you, if possible.  Clearly mark and make sure that you can see:  Any grab bars or handrails.  First and last steps.  Where the edge of each step is.  Use tools that help you move around (mobility aids) if they are needed. These include:  Canes.  Walkers.  Scooters.  Crutches.  Turn on the lights when you go into a dark area. Replace any light bulbs as soon as they burn out.  Set up your furniture so you have a clear path. Avoid moving your furniture around.  If any of your floors are uneven, fix them.  If there are any pets around you, be aware of where they are.  Review your medicines with your doctor. Some medicines can make you feel dizzy. This can increase your chance of falling. Ask your doctor what other things that you can do to help prevent falls. This information is not intended to replace advice given to you by your health care provider. Make sure you discuss any questions you have with your health care provider. Document Released: 10/23/2008 Document Revised: 06/04/2015 Document Reviewed: 01/31/2014 Elsevier Interactive Patient Education  2017 [employer].

## 2019-06-13 NOTE — Progress Notes (Signed)
This visit occurred during the SARS-CoV-2 public health emergency.  Safety protocols were in place, including screening questions prior to the visit, additional usage of staff PPE, and extensive cleaning of exam room while observing appropriate contact time as indicated for disinfecting solutions.  Subjective:   Linda Allen is a 74 y.o. female who presents for Medicare Annual (Subsequent) preventive examination.  Review of Systems:  n/a Cardiac Risk Factors include: advanced age (>6men, >32 women);obesity (BMI >30kg/m2);sedentary lifestyle     Objective:     Vitals: BP (!) 154/92 (BP Location: Left Arm, Patient Position: Sitting)   Pulse 60   Temp 98.3 F (36.8 C) (Oral)   Ht 5\' 8"  (1.727 m)   Wt 253 lb (114.8 kg)   BMI 38.47 kg/m   Body mass index is 38.47 kg/m.  Advanced Directives 06/13/2019 06/21/2018  Does Patient Have a Medical Advance Directive? Yes Yes  Type of Paramedic of [place];Living will [place];Living will  Does patient want to make changes to medical advance directive? - No - Patient declined  Copy of [place] in Chart? No - copy requested -    Tobacco Social History   Tobacco Use  Smoking Status Former Smoker  Smokeless Tobacco Never Used  Tobacco Comment   smoked for 10 yeas, d/c 40 y ago     Counseling given: Not Answered Comment: smoked for 10 yeas, d/c 40 y ago   Clinical Intake:  Pre-visit preparation completed: Yes  Pain : 0-10 Pain Score: 5  Pain Type: Chronic pain Pain Location: Hip Pain Orientation: Left Pain Descriptors / Indicators: Sharp Pain Onset: More than a month ago Pain Frequency: Constant Pain Relieving Factors: nothing  Pain Relieving Factors: nothing  Nutritional Status: BMI > 30  Obese Nutritional Risks: None Diabetes: No  How often do you need to have someone help you when you read instructions, pamphlets, or other written materials from  your doctor or pharmacy?: 1 - Never What is the last grade level you completed in school?: master's degree     Information entered by :: NAllen LPN  Past Medical History:  Diagnosis Date  . Allergy   . Arthritis    L hip  . Hypertension   . Scleroderma [facility])    Past Surgical History:  Procedure Laterality Date  . ENDOVENOUS ABLATION SAPHENOUS VEIN W/ LASER Left 09/26/2018   endovenous laser ablation left greater saphenous vein by Ruta Hinds MD   . ENDOVENOUS ABLATION SAPHENOUS VEIN W/ LASER Right 10/17/2018   endovenous laser ablation right greater saphenous vein by Ruta Hinds MD   . JOINT REPLACEMENT  2011, 2012   both knees  . SHOULDER ACROMIOPLASTY Right 2012   Family History  Problem Relation Age of Onset  . CVA Mother   . Parkinson's disease Father   . CVA Father   . Eczema Sister   . Ulcerative colitis Brother    Social History   Socioeconomic History  . Marital status: Married    Spouse name: Not on file  . Number of children: Not on file  . Years of education: Not on file  . Highest education level: Not on file  Occupational History  . Occupation: retired  Tobacco Use  . Smoking status: Former Research scientist (life sciences)  . Smokeless tobacco: Never Used  . Tobacco comment: smoked for 10 yeas, d/c 40 y ago  Substance and Sexual Activity  . Alcohol use: Not Currently  . Drug use: Never  .  Sexual activity: Yes    Birth control/protection: None    Comment: number of sex partners in the last 74 months  1  Other Topics Concern  . Not on file  Social History Narrative   Exercise walking 3 times per week for 30 minutes.      Anticipatory guidance - has living will, full code, but would not want to be on ventilator for prolonged time. organ donor.             Social Determinants of Health   Financial Resource Strain: Low Risk   . Difficulty of Paying Living Expenses: Not hard at all  Food Insecurity: No Food Insecurity  . Worried About Charity fundraiser in the  Last Year: Never true  . Ran Out of Food in the Last Year: Never true  Transportation Needs: No Transportation Needs  . Lack of Transportation (Medical): No  . Lack of Transportation (Non-Medical): No  Physical Activity: Inactive  . Days of Exercise per Week: 0 days  . Minutes of Exercise per Session: 0 min  Stress: No Stress Concern Present  . Feeling of Stress : Only a little  Social Connections:   . Frequency of Communication with Friends and Family:   . Frequency of Social Gatherings with Friends and Family:   . Attends Religious Services:   . Active Member of Clubs or Organizations:   . Attends Archivist Meetings:   Marland Kitchen Marital Status:     Outpatient Encounter Medications as of 06/13/2019  Medication Sig  . aspirin 81 MG tablet Take 81 mg by mouth daily.   No facility-administered encounter medications on file as of 06/13/2019.    Activities of Daily Living In your present state of health, do you have any difficulty performing the following activities: 06/13/2019 06/13/2019  Hearing? N N  Vision? N N  Difficulty concentrating or making decisions? N N  Walking or climbing stairs? Y Y  Comment - patient has hip pain  Dressing or bathing? N N  Doing errands, shopping? N Y  Comment - patient has hip pain  Preparing Food and eating ? N -  Using the Toilet? N -  In the past six months, have you accidently leaked urine? N -  Do you have problems with loss of bowel control? N -  Managing your Medications? N -  Managing your Finances? N -  Housekeeping or managing your Housekeeping? N -  Some recent data might be hidden    Patient Care Team: Rodriguez-[place], Sandrea Matte as PCP - General (Internal Medicine)    Assessment:   This is a routine wellness examination for Linda Allen.  Exercise Activities and Dietary recommendations Current Exercise Habits: The patient does not participate in regular exercise at present  Goals    . Patient Stated     06/13/2019, wants  to get hip surgery and losing weight, wants to weigh 150-160 pounds       Fall Risk Fall Risk  06/13/2019 06/13/2019 01/25/2018 12/28/2017 06/17/2013  Falls in the past year? 0 0 0 1 No  Number falls in past yr: - - 0 0 -  Injury with Fall? - - 1 1 -  Risk for fall due to : Orthopedic patient - - - -  Follow up Falls evaluation completed;Education provided;Falls prevention discussed - - - -   Is the patient's home free of loose throw rugs in walkways, pet beds, electrical cords, etc?   yes  Grab bars in the bathroom? yes      Handrails on the stairs?   yes      Adequate lighting?   yes  Timed Get Up and Go performed: n/a  Depression Screen PHQ 2/9 Scores 06/13/2019 06/13/2019 01/25/2018 12/28/2017  PHQ - 2 Score 0 0 0 0     Cognitive Function     6CIT Screen 06/13/2019  What Year? 0 points  What month? 0 points  What time? 0 points  Count back from 20 0 points  Months in reverse 0 points  Repeat phrase 0 points  Total Score 0    Immunization History  Administered Date(s) Administered  . Influenza,inj,Quad PF,6+ Mos 10/15/2012  . Influenza-Unspecified 11/20/2015  . PFIZER SARS-COV-2 Vaccination 03/06/2019, 04/03/2019  . Pneumococcal-Unspecified 10/10/2012  . Tdap 08/10/2009    Qualifies for Shingles Vaccine? yes  Screening Tests Health Maintenance  Topic Date Due  . Hepatitis C Screening  Never done  . MAMMOGRAM  11/07/2015  . DEXA SCAN  06/12/2020 (Originally 07/22/2012)  . COLONOSCOPY  06/12/2020 (Originally 05/14/2017)  . PNA vac Low Risk Adult (2 of 2 - PCV13) 06/12/2020 (Originally 10/10/2013)  . INFLUENZA VACCINE  08/11/2019  . TETANUS/TDAP  08/11/2019  . COVID-19 Vaccine  Completed    Cancer Screenings: Lung: Low Dose CT Chest recommended if Age 38-80 years, 30 pack-year currently smoking OR have quit w/in 15years. Patient does not qualify. Breast:  Up to date on Mammogram? No   Up to date of Bone Density/Dexa? Yes Colorectal: held due to hip  surgery  Additional Screenings: : Hepatitis C Screening: today     Plan:    Patient wants to get hip surgery completed and get down to 150-160 pounds.   I have personally reviewed and noted the following in the patient's chart:   . Medical and social history . Use of alcohol, tobacco or illicit drugs  . Current medications and supplements . Functional ability and status . Nutritional status . Physical activity . Advanced directives . List of other physicians . Hospitalizations, surgeries, and ER visits in previous 12 months . Vitals . Screenings to include cognitive, depression, and falls . Referrals and appointments  In addition, I have reviewed and discussed with patient certain preventive protocols, quality metrics, and best practice recommendations. A written personalized care plan for preventive services as well as general preventive health recommendations were provided to patient.     Kellie Simmering, LPN  04/18/6757

## 2019-06-13 NOTE — Patient Instructions (Signed)
Preventive Care 38 Years and Older, Female Preventive care refers to lifestyle choices and visits with your health care provider that can promote health and wellness. This includes:  A yearly physical exam. This is also called an annual well check.  Regular dental and eye exams.  Immunizations.  Screening for certain conditions.  Healthy lifestyle choices, such as diet and exercise. What can I expect for my preventive care visit? Physical exam Your health care provider will check:  Height and weight. These may be used to calculate body mass index (BMI), which is a measurement that tells if you are at a healthy weight.  Heart rate and blood pressure.  Your skin for abnormal spots. Counseling Your health care provider may ask you questions about:  Alcohol, tobacco, and drug use.  Emotional well-being.  Home and relationship well-being.  Sexual activity.  Eating habits.  History of falls.  Memory and ability to understand (cognition).  Work and work Statistician.  Pregnancy and menstrual history. What immunizations do I need?  Influenza (flu) vaccine  This is recommended every year. Tetanus, diphtheria, and pertussis (Tdap) vaccine  You may need a Td booster every 10 years. Varicella (chickenpox) vaccine  You may need this vaccine if you have not already been vaccinated. Zoster (shingles) vaccine  You may need this after age 33. Pneumococcal conjugate (PCV13) vaccine  One dose is recommended after age 33. Pneumococcal polysaccharide (PPSV23) vaccine  One dose is recommended after age 72. Measles, mumps, and rubella (MMR) vaccine  You may need at least one dose of MMR if you were born in 1957 or later. You may also need a second dose. Meningococcal conjugate (MenACWY) vaccine  You may need this if you have certain conditions. Hepatitis A vaccine  You may need this if you have certain conditions or if you travel or work in places where you may be exposed  to hepatitis A. Hepatitis B vaccine  You may need this if you have certain conditions or if you travel or work in places where you may be exposed to hepatitis B. Haemophilus influenzae type b (Hib) vaccine  You may need this if you have certain conditions. You may receive vaccines as individual doses or as more than one vaccine together in one shot (combination vaccines). Talk with your health care provider about the risks and benefits of combination vaccines. What tests do I need? Blood tests  Lipid and cholesterol levels. These may be checked every 5 years, or more frequently depending on your overall health.  Hepatitis C test.  Hepatitis B test. Screening  Lung cancer screening. You may have this screening every year starting at age 39 if you have a 30-pack-year history of smoking and currently smoke or have quit within the past 15 years.  Colorectal cancer screening. All adults should have this screening starting at age 36 and continuing until age 15. Your health care provider may recommend screening at age 23 if you are at increased risk. You will have tests every 1-10 years, depending on your results and the type of screening test.  Diabetes screening. This is done by checking your blood sugar (glucose) after you have not eaten for a while (fasting). You may have this done every 1-3 years.  Mammogram. This may be done every 1-2 years. Talk with your health care provider about how often you should have regular mammograms.  BRCA-related cancer screening. This may be done if you have a family history of breast, ovarian, tubal, or peritoneal cancers.  Other tests  Sexually transmitted disease (STD) testing.  Bone density scan. This is done to screen for osteoporosis. You may have this done starting at age 44. Follow these instructions at home: Eating and drinking  Eat a diet that includes fresh fruits and vegetables, whole grains, lean protein, and low-fat dairy products. Limit  your intake of foods with high amounts of sugar, saturated fats, and salt.  Take vitamin and mineral supplements as recommended by your health care provider.  Do not drink alcohol if your health care provider tells you not to drink.  If you drink alcohol: ? Limit how much you have to 0-1 drink a day. ? Be aware of how much alcohol is in your drink. In the [place] one drink equals one 12 oz bottle of beer (355 mL), one 5 oz glass of wine (148 mL), or one 1 oz glass of hard liquor (44 mL). Lifestyle  Take daily care of your teeth and gums.  Stay active. Exercise for at least 30 minutes on 5 or more days each week.  Do not use any products that contain nicotine or tobacco, such as cigarettes, e-cigarettes, and chewing tobacco. If you need help quitting, ask your health care provider.  If you are sexually active, practice safe sex. Use a condom or other form of protection in order to prevent STIs (sexually transmitted infections).  Talk with your health care provider about taking a low-dose aspirin or statin. What's next?  Go to your health care provider once a year for a well check visit.  Ask your health care provider how often you should have your eyes and teeth checked.  Stay up to date on all vaccines. This information is not intended to replace advice given to you by your health care provider. Make sure you discuss any questions you have with your health care provider. Document Revised: 12/21/2017 Document Reviewed: 12/21/2017 Elsevier Patient Education  2020 [employer].

## 2019-06-13 NOTE — Progress Notes (Signed)
This visit occurred during the SARS-CoV-2 public health emergency.  Safety protocols were in place, including screening questions prior to the visit, additional usage of staff PPE, and extensive cleaning of exam room while observing appropriate contact time as indicated for disinfecting solutions.  Subjective:     Patient ID: Linda Allen , female    DOB: July 22, 1945 , 74 y.o.   MRN: [id number]   Chief Complaint  Patient presents with  . Annual Exam    HPI 1-  HTN FU, believes the pain is causing so much pain. She went vegan for 2 months which has helped her loose wt.   2- Will have L hip replacement 6/29 and had been sitting a lot and in a lot of pain.     Past Medical History:  Diagnosis Date  . Allergy   . Arthritis    L hip  . Hypertension   . Scleroderma ([place])      Family History  Problem Relation Age of Onset  . CVA Mother   . Parkinson's disease Father   . CVA Father   . Eczema Sister   . Ulcerative colitis Brother      Current Outpatient Medications:  .  aspirin 81 MG tablet, Take 81 mg by mouth daily., Disp: , Rfl:    Allergies  Allergen Reactions  . Ivp Dye [Iodinated Diagnostic Agents]   . Penicillins Hives and Rash     Review of Systems  L hip pain, lower leg edema has improved since vein stripping procedure. The rest of 10 point ROS is neg.  Today's Vitals   06/13/19 1437 06/13/19 1454  BP: (!) 154/92 (!) 144/86  Pulse: 60   Temp: 98.3 F (36.8 C)   TempSrc: Oral   Weight: 253 lb 9.6 oz (115 kg)   Height: 5\' 8"  (1.727 m)   PainSc: 5    PainLoc: Hip    Body mass index is 38.56 kg/m.   Objective:  Physical Exam  BP (!) 144/86 (BP Location: Left Arm, Patient Position: Sitting, Cuff Size: Normal)   Pulse 60   Temp 98.3 F (36.8 C) (Oral)   Ht 5\' 8"  (1.727 m)   Wt 253 lb 9.6 oz (115 kg)   BMI 38.56 kg/m   General Appearance:    Alert, cooperative, no distress, appears stated age  Head:    Normocephalic, without obvious  abnormality, atraumatic  Eyes:    PERRL, conjunctiva/corneas clear, EOM's intact, fundi    benign, both eyes  Ears:    Normal TM's and external ear canals, both ears  Nose:   Nares normal, septum midline, mucosa normal, no drainage    or sinus tenderness  Throat:   Lips, mucosa, and tongue normal; teeth and gums normal  Neck:   Supple, symmetrical, trachea midline, no adenopathy;    thyroid:  no enlargement/tenderness/nodules; no carotid   bruit  Back:     Symmetric, no curvature, ROM normal.  Lungs:     Clear to auscultation bilaterally, respirations unlabored  Chest Wall:    No tenderness or deformity   Heart:    Regular rate and rhythm, S1 and S2 normal, 4/6 murmur, rub   or gallop  Breast Exam:    No tenderness, masses, or nipple abnormality  Abdomen:     Soft, non-tender, bowel sounds active all four quadrants,    no masses, no organomegaly  Genitalia:    declined  Rectal:    declined  Extremities:   Extremities normal,  atraumatic, no cyanosis. Has venostasis changes on lower legs, and +1/4 pitting edema of feet. R>L  Pulses:   2+ and symmetric all extremities  Skin:   Skin color, texture, turgor normal except for mentioned skin changes, no rashes or lesions  Lymph nodes:   Cervical, supraclavicular, and axillary nodes normal  Neurologic:   CNII-XII intact, normal strength, sensation and reflexes    throughout    UA- shows SG > 1.030, +1 protein, + microalbuminuria.  EKG-  Occasional PAC, otherwise within normal limits Assessment And Plan:  1. Encounter for general adult medical examination with abnormal findings- routine. FU 1 y  2. MURMUR- chronic. Needs FU with cardiology. - Ambulatory referral to Cardiology  3. MORBID OBESITY- she is working on wt loss and will continue.   4. Essential hypertension- not in good control, but also is in a lot of pain.  - POCT Urinalysis Dipstick (81002) - POCT UA - Microalbumin  5. Arthritis L hip, R shoulder, both knees- chronic. She  is awaiting to have L total hip surgery this month.   6. Proteinuria, unspecified type- stable from last time. I would like her to come back more hydrated to have this repeated, and if still positive will need 24h urine collection for creatinine and protein.   7. Microalbuminuria- stable, but needs further work up as noted in # 6. I will see what her creatinine shows.    Rozlyn Yerby RODRIGUEZ-SOUTHWORTH, PA-C    THE PATIENT IS ENCOURAGED TO PRACTICE SOCIAL DISTANCING DUE TO THE COVID-19 PANDEMIC.

## 2019-06-14 LAB — HEPATITIS C ANTIBODY: Hep C Virus Ab: <0.1 s/co ratio (ref 0.0–0.9)

## 2019-06-24 ENCOUNTER — Encounter: Payer: Self-pay | Admitting: Internal Medicine

## 2019-06-24 NOTE — H&P (Addendum)
TOTAL HIP ADMISSION H&P  Patient is admitted for left total hip arthroplasty, anterior approach.  Subjective:  Chief Complaint:   Left hip primary OA / pain  HPI: Linda Allen, 74 y.o. female, has a history of pain and functional disability in the left hip(s) due to arthritis and patient has failed non-surgical conservative treatments for greater than 12 weeks to include NSAID's and/or analgesics, corticosteriod injections, use of assistive devices and activity modification.  Onset of symptoms was gradual starting 4+ years ago with gradually worsening course since that time.The patient noted no past surgery on the left hip(s).  Patient currently rates pain in the left hip at 10 out of 10 with activity. Patient has night pain, worsening of pain with activity and weight bearing, trendelenberg gait, pain that interfers with activities of daily living and pain with passive range of motion. Patient has evidence of periarticular osteophytes, joint space narrowing and femoral head collapse and cystic changes by imaging studies. This condition presents safety issues increasing the risk of falls.   There is no current active infection.  Risks, benefits and expectations were discussed with the patient.  Risks including but not limited to the risk of anesthesia, blood clots, nerve damage, blood vessel damage, failure of the prosthesis, infection and up to and including death.  Patient understand the risks, benefits and expectations and wishes to proceed with surgery.   PCP: Shelby Mattocks, PA-C  D/C Plans:       Home  Post-op Meds:       Rx given for ASA, Robaxin, Tramadol, Iron, Colace and MiraLax  Tranexamic Acid:      To be given - IV   Decadron:      Is to be given  FYI:      ASA  Tramadol & APAP  DME:   Pt already has equipment   PT:   [facility]  Pharmacy: [facility]    Patient Active Problem List   Diagnosis Date Noted  . Arthritis L hip, R shoulder, both  knees   . MORBID OBESITY 01/12/2010  . Essential hypertension 12/22/2008  . MURMUR 12/22/2008   Past Medical History:  Diagnosis Date  . Allergy   . Arthritis    L hip  . Hypertension   . Scleroderma [facility])     Past Surgical History:  Procedure Laterality Date  . ENDOVENOUS ABLATION SAPHENOUS VEIN W/ LASER Left 09/26/2018   endovenous laser ablation left greater saphenous vein by Ruta Hinds MD   . ENDOVENOUS ABLATION SAPHENOUS VEIN W/ LASER Right 10/17/2018   endovenous laser ablation right greater saphenous vein by Ruta Hinds MD   . JOINT REPLACEMENT  2011, 2012   both knees  . SHOULDER ACROMIOPLASTY Right 2012    No current facility-administered medications for this encounter.   Current Outpatient Medications  Medication Sig Dispense Refill Last Dose  . aspirin EC 81 MG tablet Take 81 mg by mouth daily.      Allergies  Allergen Reactions  . Bee Venom Shortness Of Breath  . Ivp Dye [Iodinated Diagnostic Agents] Shortness Of Breath and Rash  . Penicillins Hives and Rash    Social History   Tobacco Use  . Smoking status: Former Research scientist (life sciences)  . Smokeless tobacco: Never Used  . Tobacco comment: smoked for 10 yeas, d/c 40 y ago  Substance Use Topics  . Alcohol use: Not Currently    Family History  Problem Relation Age of Onset  . CVA Mother   .  Parkinson's disease Father   . CVA Father   . Eczema Sister   . Ulcerative colitis Brother      Review of Systems  Constitutional: Negative.   HENT: Negative.   Eyes: Negative.   Respiratory: Negative.   Cardiovascular: Negative.   Gastrointestinal: Negative.   Genitourinary: Negative.   Musculoskeletal: Positive for joint pain.  Skin: Negative.   Neurological: Negative.   Endo/Heme/Allergies: Negative.   Psychiatric/Behavioral: Negative.       Objective:  Physical Exam  Constitutional: She is oriented to person, place, and time. She appears well-developed.  HENT:  Head: Normocephalic.  Eyes: Pupils are  equal, round, and reactive to light.  Neck: No JVD present. No tracheal deviation present. No thyromegaly present.  Cardiovascular: Normal rate and regular rhythm.  Murmur heard. Respiratory: Effort normal and breath sounds normal. No respiratory distress. She has no wheezes.  GI: Soft. There is no abdominal tenderness. There is no guarding.  Musculoskeletal:     Cervical back: Neck supple.     Left hip: Tenderness and bony tenderness present. No deformity or lacerations. Decreased range of motion. Decreased strength.  Lymphadenopathy:    She has no cervical adenopathy.  Neurological: She is alert and oriented to person, place, and time.  Skin: Skin is warm and dry.      Labs:  Estimated body mass index is 38.47 kg/m as calculated from the following:   Height as of 06/13/19: 5\' 8"  (1.727 m).   Weight as of 06/13/19: 114.8 kg.   Imaging Review Plain radiographs demonstrate severe degenerative joint disease of the left hip. The bone quality appears to be good for age and reported activity level.      Assessment/Plan:  End stage arthritis, left hip  The patient history, physical examination, clinical judgement of the provider and imaging studies are consistent with end stage degenerative joint disease of the left hip and total hip arthroplasty is deemed medically necessary. The treatment options including medical management, injection therapy, arthroscopy and arthroplasty were discussed at length. The risks and benefits of total hip arthroplasty were presented and reviewed. The risks due to aseptic loosening, infection, stiffness, dislocation/subluxation,  thromboembolic complications and other imponderables were discussed.  The patient acknowledged the explanation, agreed to proceed with the plan and consent was signed. Patient is being admitted for treatment for surgery, pain control, PT, OT, prophylactic antibiotics, VTE prophylaxis, progressive ambulation and ADL's and discharge  planning.The patient is planning to be discharged home.    West Pugh Matalie Romberger   PA-C  06/24/2019, 9:24 AM

## 2019-06-25 NOTE — Patient Instructions (Addendum)
DUE TO COVID-19 ONLY ONE VISITOR IS ALLOWED TO COME WITH YOU AND STAY IN THE WAITING ROOM ONLY DURING PRE OP AND PROCEDURE DAY OF SURGERY. THE 1 VISITOR MAY VISIT WITH YOU AFTER SURGERY IN YOUR PRIVATE ROOM DURING VISITING HOURS ONLY!  YOU NEED TO HAVE A COVID 19 TEST ON: 07/05/19@ 9:00 am  , THIS TEST MUST BE DONE BEFORE SURGERY, COME  [address] [place] , [address].  ([place]) ONCE YOUR COVID TEST IS COMPLETED, PLEASE BEGIN THE QUARANTINE INSTRUCTIONS AS OUTLINED IN YOUR HANDOUT.                Linda Allen   Your procedure is scheduled on: 07/09/19   Report to [facility] Main  Entrance   Report to admitting at: 9:00 AM     Call this number if you have problems the morning of surgery [phone]    Remember:   NO SOLID FOOD AFTER MIDNIGHT THE NIGHT PRIOR TO SURGERY. NOTHING BY MOUTH EXCEPT CLEAR LIQUIDS UNTIL: 8:30 AM . PLEASE FINISH ENSURE DRINK PER SURGEON ORDER  WHICH NEEDS TO BE COMPLETED AT: 8:30 AM .   CLEAR LIQUID DIET   Foods Allowed                                                                     Foods Excluded  Coffee and tea, regular and decaf                             liquids that you cannot  Plain Jell-O any favor except red or purple                                           see through such as: Fruit ices (not with fruit pulp)                                     milk, soups, orange juice  Iced Popsicles                                    All solid food Carbonated beverages, regular and diet                                    Cranberry, grape and apple juices Sports drinks like Gatorade Lightly seasoned clear broth or consume(fat free) Sugar, honey syrup  Sample Menu Breakfast                                Lunch                                     Supper Cranberry juice  Beef broth                            Chicken broth Jell-O                                     Grape juice                            Apple juice Coffee or tea                        Jell-O                                      Popsicle                                                Coffee or tea                        Coffee or tea  _____________________________________________________________________  BRUSH YOUR TEETH MORNING OF SURGERY AND RINSE YOUR MOUTH OUT, NO CHEWING GUM CANDY OR MINTS.                                     You may not have any metal on your body including hair pins and              piercings  Do not wear jewelry, make-up, lotions, powders or perfumes, deodorant             Do not wear nail polish on your fingernails.  Do not shave  48 hours prior to surgery.               Do not bring valuables to the hospital. [place].  Contacts, dentures or bridgework may not be worn into surgery.  Leave suitcase in the car. After surgery it may be brought to your room.     Patients discharged the day of surgery will not be allowed to drive home. IF YOU ARE HAVING SURGERY AND GOING HOME THE SAME DAY, YOU MUST HAVE AN ADULT TO DRIVE YOU HOME AND BE WITH YOU FOR 24 HOURS. YOU MAY GO HOME BY TAXI OR UBER OR ORTHERWISE, BUT AN ADULT MUST ACCOMPANY YOU HOME AND STAY WITH YOU FOR 24 HOURS.  Name and phone number of your driver:  Special Instructions: N/A              Please read over the following fact sheets you were given: _____________________________________________________________________  [facility] - Preparing for Surgery Before surgery, you can play an important role.  Because skin is not sterile, your skin needs to be as free of germs as possible.  You can reduce the number of germs on your skin by washing with CHG (chlorahexidine gluconate) soap before surgery.  CHG is an antiseptic cleaner which kills germs and bonds with  the skin to continue killing germs even after washing. Please DO NOT use if you have an allergy to CHG or antibacterial  soaps.  If your skin becomes reddened/irritated stop using the CHG and inform your nurse when you arrive at Short Stay. Do not shave (including legs and underarms) for at least 48 hours prior to the first CHG shower.  You may shave your face/neck. Please follow these instructions carefully:  1.  Shower with CHG Soap the night before surgery and the  morning of Surgery.  2.  If you choose to wash your hair, wash your hair first as usual with your  normal  shampoo.  3.  After you shampoo, rinse your hair and body thoroughly to remove the  shampoo.                           4.  Use CHG as you would any other liquid soap.  You can apply chg directly  to the skin and wash                       Gently with a scrungie or clean washcloth.  5.  Apply the CHG Soap to your body ONLY FROM THE NECK DOWN.   Do not use on face/ open                           Wound or open sores. Avoid contact with eyes, ears mouth and genitals (private parts).                       Wash face,  Genitals (private parts) with your normal soap.             6.  Wash thoroughly, paying special attention to the area where your surgery  will be performed.  7.  Thoroughly rinse your body with warm water from the neck down.  8.  DO NOT shower/wash with your normal soap after using and rinsing off  the CHG Soap.                9.  Pat yourself dry with a clean towel.            10.  Wear clean pajamas.            11.  Place clean sheets on your bed the night of your first shower and do not  sleep with pets. Day of Surgery : Do not apply any lotions/deodorants the morning of surgery.  Please wear clean clothes to the hospital/surgery center.  FAILURE TO FOLLOW THESE INSTRUCTIONS MAY RESULT IN THE CANCELLATION OF YOUR SURGERY PATIENT SIGNATURE_________________________________  NURSE SIGNATURE__________________________________  ________________________________________________________________________   Linda Allen  An  incentive spirometer is a tool that can help keep your lungs clear and active. This tool measures how well you are filling your lungs with each breath. Taking long deep breaths may help reverse or decrease the chance of developing breathing (pulmonary) problems (especially infection) following:  A long period of time when you are unable to move or be active. BEFORE THE PROCEDURE   If the spirometer includes an indicator to show your best effort, your nurse or respiratory therapist will set it to a desired goal.  If possible, sit up straight or lean slightly forward. Try not to slouch.  Hold the incentive spirometer in an upright position. INSTRUCTIONS FOR  USE  1. Sit on the edge of your bed if possible, or sit up as far as you can in bed or on a chair. 2. Hold the incentive spirometer in an upright position. 3. Breathe out normally. 4. Place the mouthpiece in your mouth and seal your lips tightly around it. 5. Breathe in slowly and as deeply as possible, raising the piston or the ball toward the top of the column. 6. Hold your breath for 3-5 seconds or for as long as possible. Allow the piston or ball to fall to the bottom of the column. 7. Remove the mouthpiece from your mouth and breathe out normally. 8. Rest for a few seconds and repeat Steps 1 through 7 at least 10 times every 1-2 hours when you are awake. Take your time and take a few normal breaths between deep breaths. 9. The spirometer may include an indicator to show your best effort. Use the indicator as a goal to work toward during each repetition. 10. After each set of 10 deep breaths, practice coughing to be sure your lungs are clear. If you have an incision (the cut made at the time of surgery), support your incision when coughing by placing a pillow or rolled up towels firmly against it. Once you are able to get out of bed, walk around indoors and cough well. You may stop using the incentive spirometer when instructed by your  caregiver.  RISKS AND COMPLICATIONS  Take your time so you do not get dizzy or light-headed.  If you are in pain, you may need to take or ask for pain medication before doing incentive spirometry. It is harder to take a deep breath if you are having pain. AFTER USE  Rest and breathe slowly and easily.  It can be helpful to keep track of a log of your progress. Your caregiver can provide you with a simple table to help with this. If you are using the spirometer at home, follow these instructions: [place] IF:   You are having difficultly using the spirometer.  You have trouble using the spirometer as often as instructed.  Your pain medication is not giving enough relief while using the spirometer.  You develop fever of 100.5 F (38.1 C) or higher. SEEK IMMEDIATE MEDICAL CARE IF:   You cough up bloody sputum that had not been present before.  You develop fever of 102 F (38.9 C) or greater.  You develop worsening pain at or near the incision site. MAKE SURE YOU:   Understand these instructions.  Will watch your condition.  Will get help right away if you are not doing well or get worse. Document Released: 05/09/2006 Document Revised: 03/21/2011 Document Reviewed: 07/10/2006 [facility] Patient Information 2014 [place], [place].   ________________________________________________________________________

## 2019-06-26 ENCOUNTER — Encounter (HOSPITAL_COMMUNITY)
Admission: RE | Admit: 2019-06-26 | Discharge: 2019-06-26 | Disposition: A | Discharge status: Home / Self Care | Payer: Medicare PPO | Source: Ambulatory Visit | Attending: Orthopedic Surgery | Admitting: Orthopedic Surgery

## 2019-06-26 ENCOUNTER — Encounter (HOSPITAL_COMMUNITY): Payer: Self-pay

## 2019-06-26 ENCOUNTER — Other Ambulatory Visit: Payer: Self-pay

## 2019-06-26 ENCOUNTER — Other Ambulatory Visit (HOSPITAL_COMMUNITY): Payer: Medicare Other

## 2019-06-26 DIAGNOSIS — Z01812 Encounter for preprocedural laboratory examination: Secondary | ICD-10-CM | POA: Diagnosis present

## 2019-06-26 HISTORY — DX: Cardiac murmur, unspecified: R01.1

## 2019-06-26 LAB — BASIC METABOLIC PANEL
Anion gap: 8 (ref 5–15)
BUN: 32 mg/dL — ABNORMAL HIGH (ref 8–23)
CO2: 27 mmol/L (ref 22–32)
Calcium: 9.7 mg/dL (ref 8.9–10.3)
Chloride: 105 mmol/L (ref 98–111)
Creatinine, Ser: 0.92 mg/dL (ref 0.44–1.00)
GFR calc Af Amer: >60 mL/min (ref >60)
GFR calc non Af Amer: >60 mL/min (ref >60)
Glucose, Bld: 100 mg/dL — ABNORMAL HIGH (ref 70–99)
Potassium: 5.2 mmol/L — ABNORMAL HIGH (ref 3.5–5.1)
Sodium: 140 mmol/L (ref 135–145)

## 2019-06-26 LAB — CBC
HCT: 38.9 % (ref 36.0–46.0)
Hemoglobin: 12.5 g/dL (ref 12.0–15.0)
MCH: 34.1 pg — ABNORMAL HIGH (ref 26.0–34.0)
MCHC: 32.1 g/dL (ref 30.0–36.0)
MCV: 106 fL — ABNORMAL HIGH (ref 80.0–100.0)
Platelets: 281 10*3/uL (ref 150–400)
RBC: 3.67 MIL/uL — ABNORMAL LOW (ref 3.87–5.11)
RDW: 14.2 % (ref 11.5–15.5)
WBC: 5.1 10*3/uL (ref 4.0–10.5)
nRBC: 0 % (ref 0.0–0.2)

## 2019-06-26 LAB — TYPE AND SCREEN
ABO/RH(D): AB POS
Antibody Screen: NEGATIVE

## 2019-06-26 LAB — SURGICAL PCR SCREEN
MRSA, PCR: NEGATIVE
Staphylococcus aureus: POSITIVE — AB

## 2019-06-26 NOTE — Progress Notes (Signed)
PCR results: positive for STAPH.

## 2019-06-30 NOTE — Progress Notes (Signed)
Cardiology Office Note:   Date:  07/02/2019  NAME:  Linda Allen    MRN: [id number] DOB:  1945/06/17   PCP:  Shelby Mattocks, PA-C  Cardiologist:  Evalina Field, MD  Referring MD: Paralee Cancel, MD   Chief Complaint  Patient presents with  . Heart Murmur   History of Present Illness:   Linda Allen is a 74 y.o. female with a hx of HTN who is being seen today for the evaluation of murmur at the request of Rodriguez-Southworth, Sunday Spillers, PA-C. She will have upcoming hip surgery and needs evaluation for murmur.  She presents for evaluation of cardiac murmur prior to left hip replacement.  She will have her hip replaced by Dr. Mechele Claude at [facility].  She reports has had a murmur for years.  She is never had this evaluated.  She has elevated blood pressure but no formal diagnosis of hypertension.  Apparently she has been in quite a bit of pain and this has not been treated.  I do agree to wait on this.  Her most recent lipid profile shows a total cholesterol 245, HDL 112, LDL 122, triglycerides 55, A1c 5.6.  She had normal hemoglobin last week.  Serum creatinine is normal.  She does have a 3 out of 6 systolic ejection murmur that is mid to late peaking.  It radiates into the carotids.  This is the murmur of aortic stenosis.  I do have concern she is on the moderate to severe side.  We will need to get this evaluated soon so she can undergo surgery on 6/29.  She reports has been inactive due to need for hip surgery.  Apparently before that she had no limitations such as chest pain shortness of breath or palpitations.  Other than her elevated blood pressure she is without significant medical problems.  She is a former smoker but quit some years ago.  Her father had a heart attack in his 71s.  She has never had any heart attack or heart history.  Past Medical History: Past Medical History:  Diagnosis Date  . Allergy   . Arthritis    L hip  . Heart murmur   .  Hypertension   . Scleroderma [facility])     Past Surgical History: Past Surgical History:  Procedure Laterality Date  . ENDOVENOUS ABLATION SAPHENOUS VEIN W/ LASER Left 09/26/2018   endovenous laser ablation left greater saphenous vein by Ruta Hinds MD   . ENDOVENOUS ABLATION SAPHENOUS VEIN W/ LASER Right 10/17/2018   endovenous laser ablation right greater saphenous vein by Ruta Hinds MD   . JOINT REPLACEMENT  2011, 2012   both knees  . SHOULDER ACROMIOPLASTY Right 2012    Current Medications: Current Meds  Medication Sig  . aspirin EC 81 MG tablet Take 81 mg by mouth daily.     Allergies:    Bee venom, Ivp dye [iodinated diagnostic agents], and Penicillins   Social History: Social History   Socioeconomic History  . Marital status: Married    Spouse name: Not on file  . Number of children: 2  . Years of education: Not on file  . Highest education level: Not on file  Occupational History  . Occupation: retired  Tobacco Use  . Smoking status: Former Smoker    Years: 10.00  . Smokeless tobacco: Never Used  . Tobacco comment: smoked for 10 yeas, d/c 40 y ago  Vaping Use  . Vaping Use: Never used  Substance and  Sexual Activity  . Alcohol use: Yes    Comment: occas.  . Drug use: Never  . Sexual activity: Yes    Birth control/protection: None    Comment: number of sex partners in the last 43 months  1  Other Topics Concern  . Not on file  Social History Narrative   Exercise walking 3 times per week for 30 minutes.      Anticipatory guidance - has living will, full code, but would not want to be on ventilator for prolonged time. organ donor.             Social Determinants of Health   Financial Resource Strain: Low Risk   . Difficulty of Paying Living Expenses: Not hard at all  Food Insecurity: No Food Insecurity  . Worried About Charity fundraiser in the Last Year: Never true  . Ran Out of Food in the Last Year: Never true  Transportation Needs: No  Transportation Needs  . Lack of Transportation (Medical): No  . Lack of Transportation (Non-Medical): No  Physical Activity: Inactive  . Days of Exercise per Week: 0 days  . Minutes of Exercise per Session: 0 min  Stress: No Stress Concern Present  . Feeling of Stress : Only a little  Social Connections:   . Frequency of Communication with Friends and Family:   . Frequency of Social Gatherings with Friends and Family:   . Attends Religious Services:   . Active Member of Clubs or Organizations:   . Attends Archivist Meetings:   Marland Kitchen Marital Status:      Family History: The patient's family history includes CVA in her father and mother; Eczema in her sister; Heart attack in her father; Heart failure in her paternal grandmother; Parkinson's disease in her father; Ulcerative colitis in her brother.  ROS:   All other ROS reviewed and negative. Pertinent positives noted in the HPI.     EKGs/Labs/Other Studies Reviewed:   The following studies were personally reviewed by me today:  EKG:  EKG is ordered today.  The ekg ordered today demonstrates normal sinus rhythm, heart rate 82, no acute ST-T changes, no evidence of prior infarction, and was personally reviewed by me.   Recent Labs: 06/26/2019: BUN 32; Creatinine, Ser 0.92; Hemoglobin 12.5; Platelets 281; Potassium 5.2; Sodium 140   Recent Lipid Panel    Component Value Date/Time   CHOL 245 (H) 12/28/2017 1506   TRIG 55 12/28/2017 1506   HDL 112 12/28/2017 1506   CHOLHDL 2.2 12/28/2017 1506   CHOLHDL 3.2 10/15/2012 1341   VLDL 19 10/15/2012 1341   LDLCALC 122 (H) 12/28/2017 1506    Physical Exam:   VS:  BP (!) 164/90 (BP Location: Right Arm, Patient Position: Sitting, Cuff Size: Large)   Pulse 82   Temp (!) 97.2 F (36.2 C)   Ht 5\' 8"  (1.727 m)   Wt 258 lb (117 kg)   SpO2 98%   BMI 39.23 kg/m    Wt Readings from Last 3 Encounters:  07/02/19 258 lb (117 kg)  06/26/19 261 lb (118.4 kg)  06/26/19 255 lb (115.7  kg)    General: Well nourished, well developed, in no acute distress Heart: Atraumatic, normal size  Eyes: PEERLA, EOMI  Neck: Supple, no JVD Endocrine: No thryomegaly Cardiac: Normal S1, S2; RRR; 3-6 systolic ejection murmur, mid to late peaking Lungs: Clear to auscultation bilaterally, no wheezing, rhonchi or rales  Abd: Soft, nontender, no hepatomegaly  Ext: No edema,  pulses 2+ Musculoskeletal: No deformities, BUE and BLE strength normal and equal Skin: Warm and dry, no rashes   Neuro: Alert and oriented to person, place, time, and situation, CNII-XII grossly intact, no focal deficits  Psych: Normal mood and affect   ASSESSMENT:   Linda Allen is a 74 y.o. female who presents for the following: 1. Nonrheumatic aortic valve stenosis   2. Murmur   3. Essential hypertension   4. Obesity (BMI 30-39.9)     PLAN:   1. Nonrheumatic aortic valve stenosis 2. Murmur -He has a 3 of 6 mid to late peaking systolic ejection murmur that radiates into the carotids.  This is consistent with aortic stenosis.  I suspect she is on the moderate to severe side.  We need to get an echocardiogram prior to surgery.  This will better help the anesthesiology team care for her during her hip replacement.  This will also help better stratify her risk for surgery.  Overall I believe she is low risk but if she does have severe stenosis we will just need to be aware of this.  This should not delay her surgery as it appears to be urgent but we should know this information before going ahead with surgery.  We will plan to do this tomorrow and I will talk with her and the surgeon tomorrow.  3. Essential hypertension -Elevated BP.  No formal diagnosis of hypertension.  Apparently she is in a lot of pain from her hip.  We will continue to monitor this for now.  4. Obesity (BMI 30-39.9) -Diet and exercise advised   Disposition: Return in about 3 months (around 10/02/2019).  Medication Adjustments/Labs and  Tests Ordered: Current medicines are reviewed at length with the patient today.  Concerns regarding medicines are outlined above.  Orders Placed This Encounter  Procedures  . EKG 12-Lead  . ECHOCARDIOGRAM COMPLETE   No orders of the defined types were placed in this encounter.   Patient Instructions  Medication Instructions:  The current medical regimen is effective;  continue present plan and medications.  *If you need a refill on your cardiac medications before your next appointment, please call your pharmacy*   Testing/Procedures: Echocardiogram (tomorrow if possible)  - Your physician has requested that you have an echocardiogram. Echocardiography is a painless test that uses sound waves to create images of your heart. It provides your doctor with information about the size and shape of your heart and how well your heart's chambers and valves are working. This procedure takes approximately one hour. There are no restrictions for this procedure. This will be performed at our [facility] location - [address].   Follow-Up: At [facility], you and your health needs are our priority.  As part of our continuing mission to provide you with exceptional heart care, we have created designated Provider Care Teams.  These Care Teams include your primary Cardiologist (physician) and Advanced Practice Providers (APPs -  Physician Assistants and Nurse Practitioners) who all work together to provide you with the care you need, when you need it.  We recommend signing up for the patient portal called "MyChart".  Sign up information is provided on this After Visit Summary.  MyChart is used to connect with patients for Virtual Visits (Telemedicine).  Patients are able to view lab/test results, encounter notes, upcoming appointments, etc.  Non-urgent messages can be sent to your provider as well.   To learn more about what you can do  with MyChart, go to [email or web address].    Your  next appointment:   3 month(s)  The format for your next appointment:   In Person  Provider:   Eleonore Chiquito, MD        Signed, Addison Naegeli. Audie Box, [place]  [address] [place], [place] [address] [phone]  07/02/2019 10:43 AM

## 2019-07-02 ENCOUNTER — Ambulatory Visit: Payer: Medicare PPO | Admitting: Cardiovascular Disease

## 2019-07-02 ENCOUNTER — Encounter: Payer: Self-pay | Admitting: Cardiovascular Disease

## 2019-07-02 ENCOUNTER — Telehealth: Payer: Self-pay

## 2019-07-02 ENCOUNTER — Ambulatory Visit (INDEPENDENT_AMBULATORY_CARE_PROVIDER_SITE_OTHER): Payer: Medicare PPO

## 2019-07-02 ENCOUNTER — Other Ambulatory Visit: Payer: Self-pay

## 2019-07-02 VITALS — BP 164/90 | HR 82 | Temp 97.2°F | Ht 68.0 in | Wt 258.0 lb

## 2019-07-02 VITALS — BP 142/86 | HR 66 | Temp 98.4°F | Ht 68.0 in | Wt 252.0 lb

## 2019-07-02 DIAGNOSIS — E669 Obesity, unspecified: Secondary | ICD-10-CM

## 2019-07-02 DIAGNOSIS — R011 Cardiac murmur, unspecified: Secondary | ICD-10-CM

## 2019-07-02 DIAGNOSIS — I10 Essential (primary) hypertension: Secondary | ICD-10-CM

## 2019-07-02 DIAGNOSIS — I350 Nonrheumatic aortic (valve) stenosis: Secondary | ICD-10-CM | POA: Diagnosis not present

## 2019-07-02 DIAGNOSIS — R809 Proteinuria, unspecified: Secondary | ICD-10-CM | POA: Diagnosis not present

## 2019-07-02 LAB — POCT URINALYSIS DIPSTICK
Bilirubin, UA: NEGATIVE
Blood, UA: NEGATIVE
Glucose, UA: NEGATIVE
Ketones, UA: NEGATIVE
Nitrite, UA: NEGATIVE
Protein, UA: NEGATIVE
Spec Grav, UA: 1.01 (ref 1.010–1.025)
Urobilinogen, UA: 0.2 E.U./dL
pH, UA: 6.5 (ref 5.0–8.0)

## 2019-07-02 NOTE — Telephone Encounter (Signed)
-----   Message from Minette Brine, [place] sent at 07/02/2019  2:39 PM EDT ----- Urine is better, negative for protein. She needs to continue with adequate water intake.

## 2019-07-02 NOTE — Telephone Encounter (Signed)
Left vm. Pt should return call for lab results

## 2019-07-02 NOTE — Progress Notes (Signed)
Pt presents today for a urine re check

## 2019-07-02 NOTE — Patient Instructions (Addendum)
Medication Instructions:  The current medical regimen is effective;  continue present plan and medications.  *If you need a refill on your cardiac medications before your next appointment, please call your pharmacy*   Testing/Procedures: Echocardiogram (tomorrow if possible)  - Your physician has requested that you have an echocardiogram. Echocardiography is a painless test that uses sound waves to create images of your heart. It provides your doctor with information about the size and shape of your heart and how well your heart's chambers and valves are working. This procedure takes approximately one hour. There are no restrictions for this procedure. This will be performed at our [facility] location - [address], [address].   Follow-Up: At [facility], you and your health needs are our priority.  As part of our continuing mission to provide you with exceptional heart care, we have created designated Provider Care Teams.  These Care Teams include your primary Cardiologist (physician) and Advanced Practice Providers (APPs -  Physician Assistants and Nurse Practitioners) who all work together to provide you with the care you need, when you need it.  We recommend signing up for the patient portal called "MyChart".  Sign up information is provided on this After Visit Summary.  MyChart is used to connect with patients for Virtual Visits (Telemedicine).  Patients are able to view lab/test results, encounter notes, upcoming appointments, etc.  Non-urgent messages can be sent to your provider as well.   To learn more about what you can do with MyChart, go to [email or web address].    Your next appointment:   3 month(s)  The format for your next appointment:   In Person  Provider:   Eleonore Chiquito, MD

## 2019-07-03 ENCOUNTER — Ambulatory Visit (HOSPITAL_COMMUNITY)
Admission: RE | Admit: 2019-07-03 | Discharge: 2019-07-03 | Disposition: A | Discharge status: Home / Self Care | Payer: Medicare PPO | Source: Ambulatory Visit | Attending: Cardiovascular Disease | Admitting: Cardiovascular Disease

## 2019-07-03 DIAGNOSIS — R011 Cardiac murmur, unspecified: Secondary | ICD-10-CM | POA: Insufficient documentation

## 2019-07-03 DIAGNOSIS — I10 Essential (primary) hypertension: Secondary | ICD-10-CM | POA: Insufficient documentation

## 2019-07-03 NOTE — Progress Notes (Signed)
  Echocardiogram 2D Echocardiogram has been performed.  Linda Allen 07/03/2019, 12:05 PM

## 2019-07-05 ENCOUNTER — Other Ambulatory Visit (HOSPITAL_COMMUNITY)
Admission: RE | Admit: 2019-07-05 | Discharge: 2019-07-05 | Disposition: A | Discharge status: Home / Self Care | Payer: Medicare PPO | Source: Ambulatory Visit | Attending: Orthopedic Surgery | Admitting: Orthopedic Surgery

## 2019-07-05 DIAGNOSIS — Z01812 Encounter for preprocedural laboratory examination: Secondary | ICD-10-CM | POA: Insufficient documentation

## 2019-07-05 DIAGNOSIS — Z20822 Contact with and (suspected) exposure to covid-19: Secondary | ICD-10-CM | POA: Diagnosis not present

## 2019-07-05 LAB — SARS CORONAVIRUS 2 (TAT 6-24 HRS): SARS Coronavirus 2: NEGATIVE

## 2019-07-09 ENCOUNTER — Encounter (HOSPITAL_COMMUNITY): Payer: Self-pay | Admitting: Orthopedic Surgery

## 2019-07-09 ENCOUNTER — Ambulatory Visit (HOSPITAL_COMMUNITY): Payer: Medicare PPO | Admitting: Certified Registered Nurse Anesthetist

## 2019-07-09 ENCOUNTER — Observation Stay (HOSPITAL_COMMUNITY): Payer: Medicare PPO

## 2019-07-09 ENCOUNTER — Encounter (HOSPITAL_COMMUNITY)
Admission: RE | Disposition: A | Discharge status: Home / Self Care | Payer: Self-pay | Source: Home / Self Care | Attending: Orthopedic Surgery

## 2019-07-09 ENCOUNTER — Ambulatory Visit (HOSPITAL_COMMUNITY): Payer: Medicare PPO

## 2019-07-09 ENCOUNTER — Other Ambulatory Visit: Payer: Self-pay

## 2019-07-09 ENCOUNTER — Observation Stay (HOSPITAL_COMMUNITY)
Admission: RE | Admit: 2019-07-09 | Discharge: 2019-07-10 | Disposition: A | Discharge status: Home / Self Care | Payer: Medicare PPO | Attending: Orthopedic Surgery | Admitting: Orthopedic Surgery

## 2019-07-09 DIAGNOSIS — Z96649 Presence of unspecified artificial hip joint: Secondary | ICD-10-CM

## 2019-07-09 DIAGNOSIS — I119 Hypertensive heart disease without heart failure: Secondary | ICD-10-CM | POA: Diagnosis not present

## 2019-07-09 DIAGNOSIS — M1612 Unilateral primary osteoarthritis, left hip: Secondary | ICD-10-CM | POA: Diagnosis not present

## 2019-07-09 DIAGNOSIS — Z6838 Body mass index (BMI) 38.0-38.9, adult: Secondary | ICD-10-CM | POA: Insufficient documentation

## 2019-07-09 DIAGNOSIS — I071 Rheumatic tricuspid insufficiency: Secondary | ICD-10-CM | POA: Insufficient documentation

## 2019-07-09 DIAGNOSIS — M8580 Other specified disorders of bone density and structure, unspecified site: Secondary | ICD-10-CM | POA: Insufficient documentation

## 2019-07-09 DIAGNOSIS — Z87891 Personal history of nicotine dependence: Secondary | ICD-10-CM | POA: Diagnosis not present

## 2019-07-09 DIAGNOSIS — Z96642 Presence of left artificial hip joint: Secondary | ICD-10-CM

## 2019-07-09 DIAGNOSIS — Z7982 Long term (current) use of aspirin: Secondary | ICD-10-CM | POA: Diagnosis not present

## 2019-07-09 DIAGNOSIS — Z419 Encounter for procedure for purposes other than remedying health state, unspecified: Secondary | ICD-10-CM

## 2019-07-09 HISTORY — PX: TOTAL HIP ARTHROPLASTY: SHX124

## 2019-07-09 SURGERY — ARTHROPLASTY, HIP, TOTAL, ANTERIOR APPROACH
Anesthesia: Spinal | Site: Hip | Laterality: Left

## 2019-07-09 MED ORDER — HYDROMORPHONE HCL 1 MG/ML IJ SOLN
0.2500 mg | INTRAMUSCULAR | Status: DC | PRN
  Administered 2019-07-09 (×2): 0.5 mg via INTRAVENOUS

## 2019-07-09 MED ORDER — HYDROMORPHONE HCL 1 MG/ML IJ SOLN
INTRAMUSCULAR | Status: AC
  Filled 2019-07-09: qty 2

## 2019-07-09 MED ORDER — MENTHOL 3 MG MT LOZG: 1.0000 | LOZENGE | OROMUCOSAL | Status: DC | PRN

## 2019-07-09 MED ORDER — DEXAMETHASONE SODIUM PHOSPHATE 10 MG/ML IJ SOLN
10.0000 mg | Freq: Once | INTRAMUSCULAR | Status: AC
  Administered 2019-07-09: 10 mg via INTRAVENOUS

## 2019-07-09 MED ORDER — ASPIRIN 81 MG PO CHEW
81.0000 mg | CHEWABLE_TABLET | Freq: Two times a day (BID) | ORAL | Status: DC
  Administered 2019-07-09 – 2019-07-10 (×2): 81 mg via ORAL
  Filled 2019-07-09 (×2): qty 1

## 2019-07-09 MED ORDER — BUPIVACAINE IN DEXTROSE 0.75-8.25 % IT SOLN
INTRATHECAL | Status: DC | PRN
  Administered 2019-07-09: 1.6 mL via INTRATHECAL

## 2019-07-09 MED ORDER — HYDROMORPHONE HCL 1 MG/ML IJ SOLN
0.5000 mg | INTRAMUSCULAR | Status: DC | PRN
  Administered 2019-07-09 (×2): 1 mg via INTRAVENOUS
  Filled 2019-07-09 (×2): qty 1

## 2019-07-09 MED ORDER — PROPOFOL 10 MG/ML IV BOLUS
INTRAVENOUS | Status: DC | PRN
  Administered 2019-07-09: 20 mg via INTRAVENOUS
  Administered 2019-07-09: 40 mg via INTRAVENOUS
  Administered 2019-07-09: 30 mg via INTRAVENOUS
  Administered 2019-07-09: 40 mg via INTRAVENOUS
  Administered 2019-07-09: 30 mg via INTRAVENOUS
  Administered 2019-07-09: 40 mg via INTRAVENOUS

## 2019-07-09 MED ORDER — METOCLOPRAMIDE HCL 5 MG/ML IJ SOLN: 5.0000 mg | Freq: Three times a day (TID) | INTRAMUSCULAR | Status: DC | PRN

## 2019-07-09 MED ORDER — PROPOFOL 500 MG/50ML IV EMUL
INTRAVENOUS | Status: DC | PRN
  Administered 2019-07-09: 125 ug/kg/min via INTRAVENOUS

## 2019-07-09 MED ORDER — METHOCARBAMOL 500 MG IVPB - SIMPLE MED
INTRAVENOUS | Status: AC
  Filled 2019-07-09: qty 50

## 2019-07-09 MED ORDER — METOCLOPRAMIDE HCL 5 MG PO TABS: 5.0000 mg | ORAL_TABLET | Freq: Three times a day (TID) | ORAL | Status: DC | PRN

## 2019-07-09 MED ORDER — MAGNESIUM CITRATE PO SOLN: 1.0000 | Freq: Once | ORAL | Status: DC | PRN

## 2019-07-09 MED ORDER — SODIUM CHLORIDE 0.9 % IV SOLN: INTRAVENOUS | Status: DC

## 2019-07-09 MED ORDER — ONDANSETRON HCL 4 MG/2ML IJ SOLN: 4.0000 mg | Freq: Four times a day (QID) | INTRAMUSCULAR | Status: DC | PRN

## 2019-07-09 MED ORDER — CELECOXIB 200 MG PO CAPS
200.0000 mg | ORAL_CAPSULE | Freq: Two times a day (BID) | ORAL | Status: DC
  Administered 2019-07-09 – 2019-07-10 (×2): 200 mg via ORAL
  Filled 2019-07-09 (×2): qty 1

## 2019-07-09 MED ORDER — ORAL CARE MOUTH RINSE: 15.0000 mL | Freq: Once | OROMUCOSAL | Status: AC

## 2019-07-09 MED ORDER — SODIUM CHLORIDE 0.9 % IR SOLN
Status: DC | PRN
  Administered 2019-07-09: 1000 mL

## 2019-07-09 MED ORDER — TRAMADOL HCL 50 MG PO TABS
50.0000 mg | ORAL_TABLET | Freq: Four times a day (QID) | ORAL | Status: DC | PRN
  Administered 2019-07-09: 100 mg via ORAL
  Filled 2019-07-09: qty 2

## 2019-07-09 MED ORDER — PHENYLEPHRINE HCL-NACL 10-0.9 MG/250ML-% IV SOLN
INTRAVENOUS | Status: DC | PRN
  Administered 2019-07-09: 40 ug/min via INTRAVENOUS

## 2019-07-09 MED ORDER — PROPOFOL 10 MG/ML IV BOLUS
INTRAVENOUS | Status: AC
  Filled 2019-07-09: qty 20

## 2019-07-09 MED ORDER — CEFAZOLIN SODIUM-DEXTROSE 2-4 GM/100ML-% IV SOLN
2.0000 g | Freq: Four times a day (QID) | INTRAVENOUS | Status: AC
  Administered 2019-07-09 (×2): 2 g via INTRAVENOUS
  Filled 2019-07-09 (×2): qty 100

## 2019-07-09 MED ORDER — CEFAZOLIN SODIUM-DEXTROSE 2-4 GM/100ML-% IV SOLN
2.0000 g | INTRAVENOUS | Status: AC
  Administered 2019-07-09: 2 g via INTRAVENOUS
  Filled 2019-07-09: qty 100

## 2019-07-09 MED ORDER — CHLORHEXIDINE GLUCONATE 0.12 % MT SOLN
15.0000 mL | Freq: Once | OROMUCOSAL | Status: AC
  Administered 2019-07-09: 15 mL via OROMUCOSAL

## 2019-07-09 MED ORDER — ACETAMINOPHEN 10 MG/ML IV SOLN: 1000.0000 mg | Freq: Once | INTRAVENOUS | Status: DC | PRN

## 2019-07-09 MED ORDER — ALUM & MAG HYDROXIDE-SIMETH 200-200-20 MG/5ML PO SUSP: 15.0000 mL | ORAL | Status: DC | PRN

## 2019-07-09 MED ORDER — BISACODYL 10 MG RE SUPP: 10.0000 mg | Freq: Every day | RECTAL | Status: DC | PRN

## 2019-07-09 MED ORDER — PROPOFOL 1000 MG/100ML IV EMUL
INTRAVENOUS | Status: AC
  Filled 2019-07-09: qty 100

## 2019-07-09 MED ORDER — TRANEXAMIC ACID-NACL 1000-0.7 MG/100ML-% IV SOLN
1000.0000 mg | INTRAVENOUS | Status: AC
  Administered 2019-07-09: 1000 mg via INTRAVENOUS
  Filled 2019-07-09: qty 100

## 2019-07-09 MED ORDER — ONDANSETRON HCL 4 MG PO TABS: 4.0000 mg | ORAL_TABLET | Freq: Four times a day (QID) | ORAL | Status: DC | PRN

## 2019-07-09 MED ORDER — STERILE WATER FOR IRRIGATION IR SOLN
Status: DC | PRN
  Administered 2019-07-09: 2000 mL

## 2019-07-09 MED ORDER — FENTANYL CITRATE (PF) 100 MCG/2ML IJ SOLN
INTRAMUSCULAR | Status: AC
  Filled 2019-07-09: qty 2

## 2019-07-09 MED ORDER — DEXAMETHASONE SODIUM PHOSPHATE 10 MG/ML IJ SOLN
INTRAMUSCULAR | Status: AC
  Filled 2019-07-09: qty 1

## 2019-07-09 MED ORDER — DIPHENHYDRAMINE HCL 12.5 MG/5ML PO ELIX: 12.5000 mg | ORAL_SOLUTION | ORAL | Status: DC | PRN

## 2019-07-09 MED ORDER — MEPERIDINE HCL 50 MG/ML IJ SOLN: 6.2500 mg | INTRAMUSCULAR | Status: DC | PRN

## 2019-07-09 MED ORDER — DEXAMETHASONE SODIUM PHOSPHATE 10 MG/ML IJ SOLN
10.0000 mg | Freq: Once | INTRAMUSCULAR | Status: AC
  Administered 2019-07-10: 10 mg via INTRAVENOUS
  Filled 2019-07-09: qty 1

## 2019-07-09 MED ORDER — FENTANYL CITRATE (PF) 100 MCG/2ML IJ SOLN
INTRAMUSCULAR | Status: DC | PRN
  Administered 2019-07-09 (×2): 50 ug via INTRAVENOUS

## 2019-07-09 MED ORDER — PHENYLEPHRINE 40 MCG/ML (10ML) SYRINGE FOR IV PUSH (FOR BLOOD PRESSURE SUPPORT)
PREFILLED_SYRINGE | INTRAVENOUS | Status: AC
  Filled 2019-07-09: qty 10

## 2019-07-09 MED ORDER — TRANEXAMIC ACID-NACL 1000-0.7 MG/100ML-% IV SOLN
1000.0000 mg | Freq: Once | INTRAVENOUS | Status: AC
  Administered 2019-07-09: 1000 mg via INTRAVENOUS
  Filled 2019-07-09: qty 100

## 2019-07-09 MED ORDER — ACETAMINOPHEN 500 MG PO TABS
1000.0000 mg | ORAL_TABLET | Freq: Four times a day (QID) | ORAL | Status: DC
  Administered 2019-07-09 – 2019-07-10 (×4): 1000 mg via ORAL
  Filled 2019-07-09 (×4): qty 2

## 2019-07-09 MED ORDER — FERROUS SULFATE 325 (65 FE) MG PO TABS
325.0000 mg | ORAL_TABLET | Freq: Three times a day (TID) | ORAL | Status: DC
  Administered 2019-07-10 (×2): 325 mg via ORAL
  Filled 2019-07-09 (×2): qty 1

## 2019-07-09 MED ORDER — POLYETHYLENE GLYCOL 3350 17 G PO PACK
17.0000 g | PACK | Freq: Two times a day (BID) | ORAL | Status: DC
  Administered 2019-07-09 – 2019-07-10 (×2): 17 g via ORAL
  Filled 2019-07-09 (×2): qty 1

## 2019-07-09 MED ORDER — ONDANSETRON HCL 4 MG/2ML IJ SOLN
INTRAMUSCULAR | Status: DC | PRN
  Administered 2019-07-09: 4 mg via INTRAVENOUS

## 2019-07-09 MED ORDER — PHENOL 1.4 % MT LIQD: 1.0000 | OROMUCOSAL | Status: DC | PRN

## 2019-07-09 MED ORDER — METHOCARBAMOL 500 MG IVPB - SIMPLE MED
500.0000 mg | Freq: Four times a day (QID) | INTRAVENOUS | Status: DC | PRN
  Administered 2019-07-09: 500 mg via INTRAVENOUS
  Filled 2019-07-09: qty 50

## 2019-07-09 MED ORDER — ONDANSETRON HCL 4 MG/2ML IJ SOLN
INTRAMUSCULAR | Status: AC
  Filled 2019-07-09: qty 2

## 2019-07-09 MED ORDER — MIDAZOLAM HCL 2 MG/2ML IJ SOLN
INTRAMUSCULAR | Status: AC
  Filled 2019-07-09: qty 2

## 2019-07-09 MED ORDER — LACTATED RINGERS IV SOLN: INTRAVENOUS | Status: DC

## 2019-07-09 MED ORDER — DOCUSATE SODIUM 100 MG PO CAPS
100.0000 mg | ORAL_CAPSULE | Freq: Two times a day (BID) | ORAL | Status: DC
  Administered 2019-07-09 – 2019-07-10 (×2): 100 mg via ORAL
  Filled 2019-07-09 (×2): qty 1

## 2019-07-09 MED ORDER — METHOCARBAMOL 500 MG PO TABS: 500.0000 mg | ORAL_TABLET | Freq: Four times a day (QID) | ORAL | Status: DC | PRN

## 2019-07-09 MED ORDER — MIDAZOLAM HCL 2 MG/2ML IJ SOLN
INTRAMUSCULAR | Status: DC | PRN
  Administered 2019-07-09 (×2): 1 mg via INTRAVENOUS

## 2019-07-09 SURGICAL SUPPLY — 48 items
BAG DECANTER FOR FLEXI CONT (MISCELLANEOUS) IMPLANT
BAG ZIPLOCK 12X15 (MISCELLANEOUS) IMPLANT
BLADE SAG 18X100X1.27 (BLADE) ×2 IMPLANT
BLADE SURG SZ10 CARB STEEL (BLADE) ×4 IMPLANT
COVER PERINEAL POST (MISCELLANEOUS) ×2 IMPLANT
COVER SURGICAL LIGHT HANDLE (MISCELLANEOUS) ×2 IMPLANT
COVER WAND RF STERILE (DRAPES) IMPLANT
CUP ACETBLR 52 OD PINNACLE (Hips) ×2 IMPLANT
DERMABOND ADVANCED (GAUZE/BANDAGES/DRESSINGS) ×1
DERMABOND ADVANCED .7 DNX12 (GAUZE/BANDAGES/DRESSINGS) ×1 IMPLANT
DRAPE STERI IOBAN 125X83 (DRAPES) ×2 IMPLANT
DRAPE U-SHAPE 47X51 STRL (DRAPES) ×4 IMPLANT
DRESSING AQUACEL AG SP 3.5X10 (GAUZE/BANDAGES/DRESSINGS) ×1 IMPLANT
DRSG AQUACEL AG ADV 3.5X10 (GAUZE/BANDAGES/DRESSINGS) ×2 IMPLANT
DRSG AQUACEL AG SP 3.5X10 (GAUZE/BANDAGES/DRESSINGS) ×2
DURAPREP 26ML APPLICATOR (WOUND CARE) ×2 IMPLANT
ELECT REM PT RETURN 15FT ADLT (MISCELLANEOUS) ×2 IMPLANT
ELIMINATOR HOLE APEX DEPUY (Hips) ×2 IMPLANT
GLOVE BIO SURGEON STRL SZ 6 (GLOVE) ×4 IMPLANT
GLOVE BIOGEL PI IND STRL 6.5 (GLOVE) ×1 IMPLANT
GLOVE BIOGEL PI IND STRL 7.5 (GLOVE) ×1 IMPLANT
GLOVE BIOGEL PI IND STRL 8.5 (GLOVE) ×1 IMPLANT
GLOVE BIOGEL PI INDICATOR 6.5 (GLOVE) ×1
GLOVE BIOGEL PI INDICATOR 7.5 (GLOVE) ×1
GLOVE BIOGEL PI INDICATOR 8.5 (GLOVE) ×1
GLOVE ECLIPSE 8.0 STRL XLNG CF (GLOVE) ×4 IMPLANT
GLOVE ORTHO TXT STRL SZ7.5 (GLOVE) ×4 IMPLANT
GOWN STRL REUS W/TWL LRG LVL3 (GOWN DISPOSABLE) ×4 IMPLANT
GOWN STRL REUS W/TWL XL LVL3 (GOWN DISPOSABLE) ×2 IMPLANT
HEAD CERAMIC DELTA 36 PLUS 1.5 (Hips) ×2 IMPLANT
HOLDER FOLEY CATH W/STRAP (MISCELLANEOUS) ×2 IMPLANT
KIT TURNOVER KIT A (KITS) IMPLANT
LINER NEUTRAL 52X36MM PLUS 4 (Liner) ×2 IMPLANT
PACK ANTERIOR HIP CUSTOM (KITS) ×2 IMPLANT
PENCIL SMOKE EVACUATOR (MISCELLANEOUS) IMPLANT
SCREW 6.5MMX30MM (Screw) ×2 IMPLANT
STEM FEMORAL SZ6 HIGH ACTIS (Stem) ×2 IMPLANT
SUT MNCRL AB 4-0 PS2 18 (SUTURE) ×2 IMPLANT
SUT STRATAFIX 0 PDS 27 VIOLET (SUTURE) ×2
SUT VIC AB 1 CT1 36 (SUTURE) ×6 IMPLANT
SUT VIC AB 2-0 CT1 27 (SUTURE) ×4
SUT VIC AB 2-0 CT1 TAPERPNT 27 (SUTURE) ×2 IMPLANT
SUTURE STRATFX 0 PDS 27 VIOLET (SUTURE) ×1 IMPLANT
SYR CONTROL 10ML LL (SYRINGE) ×2 IMPLANT
TRAY FOLEY MTR SLVR 14FR STAT (SET/KITS/TRAYS/PACK) ×2 IMPLANT
TRAY FOLEY MTR SLVR 16FR STAT (SET/KITS/TRAYS/PACK) ×2 IMPLANT
WATER STERILE IRR 1000ML POUR (IV SOLUTION) ×2 IMPLANT
YANKAUER SUCT BULB TIP 10FT TU (MISCELLANEOUS) IMPLANT

## 2019-07-09 NOTE — Interval H&P Note (Signed)
History and Physical Interval Note:  07/09/2019 9:52 AM  Linda Allen  has presented today for surgery, with the diagnosis of Left hip osteoarthritis.  The various methods of treatment have been discussed with the patient and family. After consideration of risks, benefits and other options for treatment, the patient has consented to  Procedure(s) with comments: TOTAL HIP ARTHROPLASTY ANTERIOR APPROACH (Left) - 70 mins as a surgical intervention.  The patient's history has been reviewed, patient examined, no change in status, stable for surgery.  I have reviewed the patient's chart and labs.  Questions were answered to the patient's satisfaction.     [name]

## 2019-07-09 NOTE — Evaluation (Signed)
Physical Therapy Evaluation Patient Details Name: Linda Allen MRN: [id number] DOB: 04/28/1945 Today's Date: 07/09/2019   History of Present Illness  Patient is 74 y.o. female s/p Lt THA anterior approach on 07/09/19 with PMH significant for scleroderma, HTN, OA.  Clinical Impression  Florine Sprenkle Cowger is a 74 y.o. female POD 0 s/p Lt THA anterior approach. Patient reports limited mobility PTA and use of RW/SPC for household mobility at baseline due to pain. Patient is now limited by functional impairments (see PT problem list below) and requires min assist for bed mobility. Patient was limited this session by nausea and emesis sitting EOB. BP assessed and continues to be elevated at 180/76 with HR of 80 bpm. Min assist +2 to return to supine and reposition. Pt reported reduction in pain after mobility and ice applied to continue to manage pain. Patient will benefit from continued skilled PT interventions to address impairments and progress towards PLOF. Acute PT will follow to progress mobility and stair training in preparation for safe discharge home.     Follow Up Recommendations Follow surgeon's recommendation for DC plan and follow-up therapies    Equipment Recommendations  None recommended by PT    Recommendations for Other Services       Precautions / Restrictions Precautions Precautions: Fall Restrictions Weight Bearing Restrictions: No Other Position/Activity Restrictions: WBAT      Mobility  Bed Mobility Overal bed mobility: Needs Assistance Bed Mobility: Supine to Sit;Sit to Supine     Supine to sit: Min assist;HOB elevated Sit to supine: Min assist;+2 for physical assistance;+2 for safety/equipment   General bed mobility comments: cues for sequencing with LE's to walk to EOB. cues to use bed rail, pt limited by Lt shoulder pain. min assist to raise trunk upright. +2 min assist to raise LE's and lower trunk back into bed. +2 to help pt boost up superiorly in  bed.  Transfers    General transfer comment: deferred due to nausea and emesis.  Ambulation/Gait       Stairs     Wheelchair Mobility    Modified Rankin (Stroke Patients Only)       Balance Overall balance assessment: Needs assistance Sitting-balance support: Bilateral upper extremity supported;Feet supported Sitting balance-Leahy Scale: Fair             Pertinent Vitals/Pain Pain Assessment: 0-10 Pain Score: 3  Pain Location: Lt hip Pain Descriptors / Indicators: Aching;Burning;Discomfort Pain Intervention(s): Limited activity within patient's tolerance;Monitored during session;Repositioned;Ice applied;Premedicated before session    Home Living Family/patient expects to be discharged to:: Private residence Living Arrangements: Spouse/significant other (daughter will be there to help) Available Help at Discharge: Family Type of Home: House Home Access: Stairs to enter Entrance Stairs-Rails: None Entrance Stairs-Number of Steps: 1+1 Home Layout: Two level;Bed/bath upstairs;Full bath on main level Home Equipment: Walker - 2 wheels;Wheelchair - manual;Cane - single point;Grab bars - tub/shower;Bedside commode      Prior Function Level of Independence: Independent with assistive device(s)      Comments: pt reports very limited has been household mobilizer for 1+ years due to hip pain.     Hand Dominance   Dominant Hand: Right    Extremity/Trunk Assessment   Upper Extremity Assessment Upper Extremity Assessment: Overall WFL for tasks assessed    Lower Extremity Assessment Lower Extremity Assessment: Generalized weakness    Cervical / Trunk Assessment Cervical / Trunk Assessment: Other exceptions Cervical / Trunk Exceptions: large habitus  Communication   Communication: No difficulties  Cognition  Arousal/Alertness: Awake/alert Behavior During Therapy: WFL for tasks assessed/performed Overall Cognitive Status: Within Functional Limits for tasks  assessed         General Comments General comments (skin integrity, edema, etc.): pt noted to have bruise on Lt knee and Lt shoulder pain. Pt does not recall fall however husband reports she had recent fall when tripping over dog this past week.     Exercises     Assessment/Plan    PT Assessment Patient needs continued PT services  PT Problem List Decreased strength;Decreased range of motion;Decreased activity tolerance;Decreased balance;Decreased mobility;Decreased knowledge of use of DME;Decreased knowledge of precautions;Pain       PT Treatment Interventions Gait training;DME instruction;Therapeutic activities;Stair training;Functional mobility training;Balance training;Therapeutic exercise;Patient/family education    PT Goals (Current goals can be found in the Care Plan section)  Acute Rehab PT Goals Patient Stated Goal: get back to walking and lose more weight PT Goal Formulation: With patient Time For Goal Achievement: 07/16/19 Potential to Achieve Goals: Good    Frequency 7X/week    AM-PAC PT "6 Clicks" Mobility  Outcome Measure Help needed turning from your back to your side while in a flat bed without using bedrails?: A Little Help needed moving from lying on your back to sitting on the side of a flat bed without using bedrails?: A Little Help needed moving to and from a bed to a chair (including a wheelchair)?: A Lot Help needed standing up from a chair using your arms (e.g., wheelchair or bedside chair)?: A Lot Help needed to walk in hospital room?: A Lot Help needed climbing 3-5 steps with a railing? : A Lot 6 Click Score: 14    End of Session Equipment Utilized During Treatment: Gait belt Activity Tolerance: Treatment limited secondary to medical complications (Comment) (nausea and emesis) Patient left: in bed;with call bell/phone within reach;with bed alarm set;with family/visitor present Nurse Communication: Mobility status;Other (comment) (nausea and  emesis) PT Visit Diagnosis: Muscle weakness (generalized) (M62.81);Difficulty in walking, not elsewhere classified (R26.2);Pain Pain - Right/Left: Left Pain - part of body: Hip    Time: 1537-9432 PT Time Calculation (min) (ACUTE ONLY): 33 min   Charges:   PT Evaluation $PT Eval Low Complexity: 1 Low PT Treatments $Therapeutic Activity: 8-22 mins        Verner Mould, DPT Acute Rehabilitation Services  Office [phone] Pager [phone]  07/09/2019 7:05 PM

## 2019-07-09 NOTE — Op Note (Signed)
NAME:  Linda Allen NO.: [id number]      MEDICAL RECORD NO.: [id number]      FACILITY:  [facility]      PHYSICIAN:  [name]  DATE OF BIRTH:  1945/10/26     DATE OF PROCEDURE:  07/09/2019                                 OPERATIVE REPORT         PREOPERATIVE DIAGNOSIS: Left  hip osteoarthritis.      POSTOPERATIVE DIAGNOSIS:  Left hip osteoarthritis.      PROCEDURE:  Left total hip replacement through an anterior approach   utilizing DePuy THR system, component size 52 mm pinnacle cup, a size 36+4 neutral   Altrex liner, a size  6 Hi Actis stem with a 36+1.5 delta ceramic   ball.      SURGEON:  Pietro Cassis. Alvan Dame, M.D.      ASSISTANT:  Danae Orleans, PA-C     ANESTHESIA:  Spinal.      SPECIMENS:  None.      COMPLICATIONS:  None.      BLOOD LOSS:  700 cc     DRAINS:  None.      INDICATION OF THE PROCEDURE:  Linda Allen is a 74 y.o. female who had   presented to office for evaluation of left hip pain.  Radiographs revealed   progressive degenerative changes with bone-on-bone   articulation of the  hip joint, including subchondral cystic changes and osteophytes.  The patient had painful limited range of   motion significantly affecting their overall quality of life and function.  The patient was failing to    respond to conservative measures including medications and/or injections and activity modification and at this point was ready   to proceed with more definitive measures.  Consent was obtained for   benefit of pain relief.  Specific risks of infection, DVT, component   failure, dislocation, neurovascular injury, and need for revision surgery were reviewed in the office as well discussion of   the anterior versus posterior approach were reviewed.     PROCEDURE IN DETAIL:  The patient was brought to operative theater.   Once adequate anesthesia, preoperative antibiotics, 2 gm of Ancef, 1 gm of Tranexamic Acid,  and 10 mg of Decadron were administered, the patient was positioned supine on the [employer] table.  Once the patient was safely positioned with adequate padding of boney prominences we predraped out the hip, and used fluoroscopy to confirm orientation of the pelvis.      The left hip was then prepped and draped from proximal iliac crest to   mid thigh with a shower curtain technique.      Time-out was performed identifying the patient, planned procedure, and the appropriate extremity.     An incision was then made 2 cm lateral to the   anterior superior iliac spine extending over the orientation of the   tensor fascia lata muscle and sharp dissection was carried down to the   fascia of the muscle.      The fascia was then incised.  The muscle belly was identified and swept   laterally and retractor placed along the superior neck.  Following   cauterization of the circumflex vessels and removing  some pericapsular   fat, a second cobra retractor was placed on the inferior neck.  A T-capsulotomy was made along the line of the   superior neck to the trochanteric fossa, then extended proximally and   distally.  Tag sutures were placed and the retractors were then placed   intracapsular.  We then identified the trochanteric fossa and   orientation of my neck cut and then made a neck osteotomy with the femur on traction.  The femoral   head was removed without difficulty or complication.  Traction was let   off and retractors were placed posterior and anterior around the   acetabulum.      The labrum and foveal tissue were debrided.  I began reaming with a 46 mm   reamer and reamed up to 51 mm reamer with good bony bed preparation and a 52 mm  cup was chosen.  The final 52 mm Pinnacle cup was then impacted under fluoroscopy to confirm the depth of penetration and orientation with respect to   Abduction and forward flexion.  A screw was placed into the ilium followed by the hole eliminator.  The  final   36+4 neutral Altrex liner was impacted with good visualized rim fit.  The cup was positioned anatomically within the acetabular portion of the pelvis.      At this point, the femur was rolled to 100 degrees.  Further capsule was   released off the inferior aspect of the femoral neck.  I then   released the superior capsule proximally.  With the leg in a neutral position the hook was placed laterally   along the femur under the vastus lateralis origin and elevated manually and then held in position using the hook attachment on the bed.  The leg was then extended and adducted with the leg rolled to 100   degrees of external rotation.  Retractors were placed along the medial calcar and posteriorly over the greater trochanter.  Once the proximal femur was fully   exposed, I used a box osteotome to set orientation.  I then began   broaching with the starting chili pepper broach and passed this by hand and then broached up to 6.  With the 6 broach in place I chose a high offset neck and did several trial reductions.  The offset was appropriate, leg lengths   appeared to be equal best matched with the +1.5 head ball trial confirmed radiographically.   Given these findings, I went ahead and dislocated the hip, repositioned all   retractors and positioned the right hip in the extended and abducted position.  The final 6 Hi Actis stem was   chosen and it was impacted down to the level of neck cut.  Based on this   and the trial reductions, a final 36+1.5 delta ceramic ball was chosen and   impacted onto a clean and dry trunnion, and the hip was reduced.  The   hip had been irrigated throughout the case again at this point.  I did   reapproximate the superior capsular leaflet to the anterior leaflet   using #1 Vicryl.  The fascia of the   tensor fascia lata muscle was then reapproximated using #1 Vicryl and #0 Stratafix sutures.  The   remaining wound was closed with 2-0 Vicryl and running 4-0  Monocryl.   The hip was cleaned, dried, and dressed sterilely using Dermabond and   Aquacel dressing.  The patient was then brought  to recovery room in stable condition tolerating the procedure well.    Danae Orleans, PA-C was present for the entirety of the case involved from   preoperative positioning, perioperative retractor management, general   facilitation of the case, as well as primary wound closure as assistant.            Pietro Cassis Alvan Dame, M.D.        07/09/2019 1:58 PM

## 2019-07-09 NOTE — Transfer of Care (Signed)
Immediate Anesthesia Transfer of Care Note  Patient: Linda Allen  Procedure(s) Performed: TOTAL HIP ARTHROPLASTY ANTERIOR APPROACH (Left Hip)  Patient Location: PACU  Anesthesia Type:Spinal  Level of Consciousness: awake, alert  and oriented  Airway & Oxygen Therapy: Patient Spontanous Breathing and Patient connected to face mask oxygen  Post-op Assessment: Report given to RN and Post -op Vital signs reviewed and stable  Post vital signs: Reviewed and stable  Last Vitals:  Vitals Value Taken Time  BP 169/99 07/09/19 1315  Temp    Pulse 88 07/09/19 1316  Resp 13 07/09/19 1316  SpO2 100 % 07/09/19 1316  Vitals shown include unvalidated device data.  Last Pain:  Vitals:   07/09/19 1023  TempSrc: Oral  PainSc:       Patients Stated Pain Goal: 4 (18/55/01 5868)  Complications: No complications documented.

## 2019-07-09 NOTE — Anesthesia Procedure Notes (Signed)
Spinal  Patient location during procedure: OR Start time: 07/09/2019 11:10 AM End time: 07/09/2019 11:14 AM Staffing Performed: anesthesiologist  Anesthesiologist: Lyn Hollingshead, MD Preanesthetic Checklist Completed: patient identified, IV checked, site marked, risks and benefits discussed, surgical consent, monitors and equipment checked, pre-op evaluation and timeout performed Spinal Block Patient position: sitting Prep: DuraPrep and site prepped and draped Patient monitoring: continuous pulse ox and blood pressure Approach: midline Location: L3-4 Injection technique: single-shot Needle Needle type: Pencan and Quincke  Needle gauge: 22 G Needle length: 9 cm Needle insertion depth: 6 cm Assessment Sensory level: T8

## 2019-07-09 NOTE — Anesthesia Preprocedure Evaluation (Signed)
Anesthesia Evaluation  Patient identified by MRN, date of birth, ID band Patient awake    Reviewed: Allergy & Precautions, NPO status , Patient's Chart, lab work & pertinent test results  Airway Mallampati: II       Dental no notable dental hx. (+) Teeth Intact   Pulmonary former smoker,    Pulmonary exam normal breath sounds clear to auscultation       Cardiovascular Normal cardiovascular exam Rhythm:Regular Rate:Normal + Systolic murmurs    Neuro/Psych negative psych ROS   GI/Hepatic negative GI ROS, Neg liver ROS,   Endo/Other  Morbid obesity  Renal/GU negative Renal ROS  negative genitourinary   Musculoskeletal   Abdominal (+) + obese,   Peds  Hematology negative hematology ROS (+)   Anesthesia Other Findings   Reproductive/Obstetrics                             Anesthesia Physical Anesthesia Plan  ASA: II  Anesthesia Plan: Spinal   Post-op Pain Management:    Induction:   PONV Risk Score and Plan: Ondansetron and Dexamethasone  Airway Management Planned: Natural Airway, Simple Face Mask and Nasal Cannula  Additional Equipment: None  Intra-op Plan:   Post-operative Plan:   Informed Consent: I have reviewed the patients History and Physical, chart, labs and discussed the procedure including the risks, benefits and alternatives for the proposed anesthesia with the patient or authorized representative who has indicated his/her understanding and acceptance.       Plan Discussed with: CRNA  Anesthesia Plan Comments:         Anesthesia Quick Evaluation

## 2019-07-09 NOTE — Anesthesia Postprocedure Evaluation (Signed)
Anesthesia Post Note  Patient: Linda Allen  Procedure(s) Performed: TOTAL HIP ARTHROPLASTY ANTERIOR APPROACH (Left Hip)     Patient location during evaluation: PACU Anesthesia Type: Spinal Level of consciousness: awake Pain management: pain level controlled Vital Signs Assessment: post-procedure vital signs reviewed and stable Respiratory status: spontaneous breathing Cardiovascular status: stable Postop Assessment: no headache, no backache, spinal receding, patient able to bend at knees and no apparent nausea or vomiting Anesthetic complications: no   No complications documented.  Last Vitals:  Vitals:   07/09/19 1600 07/09/19 1658  BP: (!) 152/77 (!) 172/92  Pulse: (!) 51 62  Resp:  18  Temp:  36.4 C  SpO2: 100% 100%    Last Pain:  Vitals:   07/09/19 1658  TempSrc: Oral  PainSc:                  Huston Foley

## 2019-07-09 NOTE — Discharge Instructions (Signed)

## 2019-07-10 ENCOUNTER — Encounter (HOSPITAL_COMMUNITY): Payer: Self-pay | Admitting: Orthopedic Surgery

## 2019-07-10 DIAGNOSIS — M1612 Unilateral primary osteoarthritis, left hip: Secondary | ICD-10-CM | POA: Diagnosis not present

## 2019-07-10 LAB — BASIC METABOLIC PANEL
Anion gap: 7 (ref 5–15)
BUN: 31 mg/dL — ABNORMAL HIGH (ref 8–23)
CO2: 26 mmol/L (ref 22–32)
Calcium: 8.7 mg/dL — ABNORMAL LOW (ref 8.9–10.3)
Chloride: 101 mmol/L (ref 98–111)
Creatinine, Ser: 1 mg/dL (ref 0.44–1.00)
GFR calc Af Amer: >60 mL/min (ref >60)
GFR calc non Af Amer: 57 mL/min — ABNORMAL LOW (ref >60)
Glucose, Bld: 166 mg/dL — ABNORMAL HIGH (ref 70–99)
Potassium: 5 mmol/L (ref 3.5–5.1)
Sodium: 134 mmol/L — ABNORMAL LOW (ref 135–145)

## 2019-07-10 LAB — CBC
HCT: 33.9 % — ABNORMAL LOW (ref 36.0–46.0)
Hemoglobin: 10.9 g/dL — ABNORMAL LOW (ref 12.0–15.0)
MCH: 33.7 pg (ref 26.0–34.0)
MCHC: 32.2 g/dL (ref 30.0–36.0)
MCV: 105 fL — ABNORMAL HIGH (ref 80.0–100.0)
Platelets: 235 10*3/uL (ref 150–400)
RBC: 3.23 MIL/uL — ABNORMAL LOW (ref 3.87–5.11)
RDW: 13.6 % (ref 11.5–15.5)
WBC: 7.4 10*3/uL (ref 4.0–10.5)
nRBC: 0 % (ref 0.0–0.2)

## 2019-07-10 MED ORDER — TRAMADOL HCL 50 MG PO TABS: 50.0000 mg | ORAL_TABLET | Freq: Four times a day (QID) | ORAL | 0 refills | Status: DC | PRN

## 2019-07-10 MED ORDER — METHOCARBAMOL 500 MG PO TABS: 500.0000 mg | ORAL_TABLET | Freq: Four times a day (QID) | ORAL | 0 refills | Status: DC | PRN

## 2019-07-10 MED ORDER — DOCUSATE SODIUM 100 MG PO CAPS
100.0000 mg | ORAL_CAPSULE | Freq: Two times a day (BID) | ORAL | 0 refills | Status: DC
Start: 2019-07-10 — End: 2019-11-07

## 2019-07-10 MED ORDER — FERROUS SULFATE 325 (65 FE) MG PO TABS: 325.0000 mg | ORAL_TABLET | Freq: Three times a day (TID) | ORAL | 0 refills | Status: DC

## 2019-07-10 MED ORDER — POLYETHYLENE GLYCOL 3350 17 G PO PACK
17.0000 g | PACK | Freq: Two times a day (BID) | ORAL | 0 refills | Status: DC
Start: 2019-07-10 — End: 2019-11-07

## 2019-07-10 MED ORDER — ASPIRIN 81 MG PO CHEW
81.0000 mg | CHEWABLE_TABLET | Freq: Two times a day (BID) | ORAL | 0 refills | Status: AC
Start: 2019-07-11 — End: 2019-08-10

## 2019-07-10 MED ORDER — ACETAMINOPHEN 500 MG PO TABS
1000.0000 mg | ORAL_TABLET | Freq: Three times a day (TID) | ORAL | 0 refills | Status: DC
Start: 2019-07-10 — End: 2019-09-20

## 2019-07-10 NOTE — Discharge Summary (Signed)
Patient ID: CAREY LAFON MRN: [id number] DOB/AGE: 72/30/1947 72 y.o.  Admit date: 07/09/2019 Discharge date: 07/10/2019  Admission Diagnoses:  Principal Problem:   Left hip OA Active Problems:   S/P left THA, AA   S/P hip replacement, left   Discharge Diagnoses:  Same  Past Medical History:  Diagnosis Date  . Allergy   . Arthritis    L hip  . Heart murmur   . Hypertension   . Scleroderma ([place])     Surgeries: Procedure(s):  LEFT TOTAL HIP ARTHROPLASTY ANTERIOR APPROACH on 07/09/2019   Consultants: N/A  Discharged Condition: Improved  Hospital Course: TAMELA ELSAYED is an 74 y.o. female who was admitted 07/09/2019 for operative treatment ofOsteoarthritis of left hip. Patient has severe unremitting pain that affects sleep, daily activities, and work/hobbies. After pre-op clearance the patient was taken to the operating room on 07/09/2019 and underwent  Procedure(s): LEFT TOTAL HIP ARTHROPLASTY ANTERIOR APPROACH.    Patient was given perioperative antibiotics:  Anti-infectives (From admission, onward)   Start     Dose/Rate Route Frequency Ordered Stop   07/09/19 1800  ceFAZolin (ANCEF) IVPB 2g/100 mL premix        2 g 200 mL/hr over 30 Minutes Intravenous Every 6 hours 07/09/19 1455 07/10/19 0012   07/09/19 0930  ceFAZolin (ANCEF) IVPB 2g/100 mL premix        2 g 200 mL/hr over 30 Minutes Intravenous On call to O.R. 07/09/19 9179 07/09/19 1119       Patient was given sequential compression devices, early ambulation, and chemoprophylaxis to prevent DVT.  Patient benefited maximally from hospital stay and there were no complications.    Recent vital signs:  Patient Vitals for the past 24 hrs:  BP Temp Temp src Pulse Resp SpO2 Height Weight  07/10/19 0625 (!) 143/73 98.2 F (36.8 C) Oral 68 16 100 % -- --  07/10/19 0159 (!) 159/69 97.9 F (36.6 C) Oral 68 16 99 % -- --  07/09/19 2208 (!) 155/82 97.7 F (36.5 C) Oral 60 16 100 % -- --  07/09/19 1757 (!)  150/77 97.7 F (36.5 C) Oral 61 12 100 % -- --  07/09/19 1658 (!) 172/92 97.6 F (36.4 C) Oral 62 18 100 % -- --  07/09/19 1600 (!) 152/77 -- -- (!) 51 -- 100 % -- --  07/09/19 1457 (!) 194/80 97.6 F (36.4 C) Oral 65 16 100 % -- --  07/09/19 1445 (!) 184/98 97.6 F (36.4 C) -- (!) 54 (!) 9 100 % -- --  07/09/19 1430 (!) 181/81 -- -- (!) 51 (!) 9 100 % -- --  07/09/19 1415 (!) 184/91 -- -- (!) 50 (!) 9 100 % -- --  07/09/19 1400 (!) 185/93 -- -- 62 11 100 % -- --  07/09/19 1345 (!) 203/97 -- -- 65 10 100 % -- --  07/09/19 1330 (!) 169/99 -- -- 75 14 100 % -- --  07/09/19 1315 (!) 169/99 (!) 96.9 F (36.1 C) -- (!) 101 10 100 % -- --  07/09/19 1023 (!) 173/84 98.6 F (37 C) Oral 87 18 100 % -- --  07/09/19 0941 -- -- -- -- -- -- 5\' 8"  (1.727 m) 114.3 kg     Recent laboratory studies:  Recent Labs    07/10/19 0251  WBC 7.4  HGB 10.9*  HCT 33.9*  PLT 235  NA 134*  K 5.0  CL 101  CO2 26  BUN 31*  CREATININE 1.00  GLUCOSE 166*  CALCIUM 8.7*     Discharge Medications:   Allergies as of 07/10/2019      Reactions   Bee Venom Shortness Of Breath   Ivp Dye [iodinated Diagnostic Agents] Shortness Of Breath, Rash   Penicillins Hives, Rash   Tolerated Cephalosporin Date: 07/09/19.        Medication List    STOP taking these medications   aspirin EC 81 MG tablet Replaced by: aspirin 81 MG chewable tablet     TAKE these medications   acetaminophen 500 MG tablet Commonly known as: TYLENOL Take 2 tablets (1,000 mg total) by mouth every 8 (eight) hours.   aspirin 81 MG chewable tablet Commonly known as: Aspirin Childrens Chew 1 tablet (81 mg total) by mouth 2 (two) times daily. Take for 4 weeks, then resume regular dose. Start taking on: July 11, 2019 Replaces: aspirin EC 81 MG tablet   docusate sodium 100 MG capsule Commonly known as: Colace Take 1 capsule (100 mg total) by mouth 2 (two) times daily.   ferrous sulfate 325 (65 FE) MG tablet Commonly known as:  FerrouSul Take 1 tablet (325 mg total) by mouth 3 (three) times daily with meals for 14 days.   methocarbamol 500 MG tablet Commonly known as: Robaxin Take 1 tablet (500 mg total) by mouth every 6 (six) hours as needed for muscle spasms.   polyethylene glycol 17 g packet Commonly known as: MIRALAX / GLYCOLAX Take 17 g by mouth 2 (two) times daily.   traMADol 50 MG tablet Commonly known as: Ultram Take 1-2 tablets (50-100 mg total) by mouth every 6 (six) hours as needed.            Discharge Care Instructions  (From admission, onward)         Start     Ordered   07/10/19 0000  Change dressing       Comments: Maintain surgical dressing until follow up in the clinic. If the edges start to pull up, may reinforce with tape. If the dressing is no longer working, may remove and cover with gauze and tape, but must keep the area dry and clean.  Call with any questions or concerns.   07/10/19 0254          Diagnostic Studies: DG Pelvis Portable  Result Date: 07/09/2019 CLINICAL DATA:  Status post total left hip replacement. EXAM: PORTABLE PELVIS 1-2 VIEWS COMPARISON:  No prior. FINDINGS: Total left hip replacement. Hardware intact. Anatomic alignment. Diffuse osteopenia. Degenerative change right hip. No acute bony abnormality identified. IMPRESSION: Total left hip replacement. Hardware intact. Anatomic alignment. No acute abnormality. Electronically Signed   By: Marcello Moores  Register   On: 07/09/2019 14:12   DG C-Arm 1-60 Min-No Report  Result Date: 07/09/2019 Fluoroscopy was utilized by the requesting physician.  No radiographic interpretation.   ECHOCARDIOGRAM COMPLETE  Result Date: 07/03/2019    ECHOCARDIOGRAM REPORT   Patient Name:   KORRI ASK Matar Date of Exam: 07/03/2019 Medical Rec #:  [id number]           Height:       68.0 in Accession #:    [id number]          Weight:       252.0 lb Date of Birth:  1945/10/16           BSA:          2.255 m Patient Age:    74 years  BP:           186/102 mmHg Patient Gender: F                   HR:           76 bpm. Exam Location:  Outpatient Procedure: 2D Echo, Cardiac Doppler, Color Doppler and Strain Analysis Indications:    Z01.818 Encounter for other preprocedural examination; R01.1                 Murmur  History:        Patient has prior history of Echocardiogram examinations, most                 recent 12/31/2009. Signs/Symptoms:Murmur; Risk                 Factors:Hypertension.  Sonographer:    Roseanna Rainbow Referring Phys: [id number] [place]  Sonographer Comments: Technically difficult study due to poor echo windows and patient is morbidly obese. Image acquisition challenging due to patient body habitus. Paitent in pain from hip. IMPRESSIONS  1. Left ventricular ejection fraction, by estimation, is 60 to 65%. The left ventricle has normal function. The left ventricle has no regional wall motion abnormalities. There is mild concentric left ventricular hypertrophy. Left ventricular diastolic parameters are consistent with Grade II diastolic dysfunction (pseudonormalization). The average left ventricular global longitudinal strain is -24.5 %. The global longitudinal strain is normal.  2. Right ventricular systolic function is normal. The right ventricular size is normal. Tricuspid regurgitation signal is inadequate for assessing PA pressure.  3. Left atrial size was mildly dilated.  4. The mitral valve is degenerative. No evidence of mitral valve regurgitation. No evidence of mitral stenosis.  5. The aortic valve is tricuspid. Aortic valve regurgitation is not visualized. Moderate aortic valve stenosis. Aortic valve area, by VTI measures 1.08 cm. Aortic valve mean gradient measures 25.7 mmHg. Aortic valve Vmax measures 3.21 m/s.  6. The inferior vena cava is normal in size with greater than 50% respiratory variability, suggesting right atrial pressure of 3 mmHg. FINDINGS  Left Ventricle: Left ventricular ejection fraction, by  estimation, is 60 to 65%. The left ventricle has normal function. The left ventricle has no regional wall motion abnormalities. The average left ventricular global longitudinal strain is -24.5 %. The global longitudinal strain is normal. The left ventricular internal cavity size was normal in size. There is mild concentric left ventricular hypertrophy. Left ventricular diastolic parameters are consistent with Grade II diastolic dysfunction (pseudonormalization). Normal left ventricular filling pressure. Right Ventricle: The right ventricular size is normal. No increase in right ventricular wall thickness. Right ventricular systolic function is normal. Tricuspid regurgitation signal is inadequate for assessing PA pressure. Left Atrium: Left atrial size was mildly dilated. Right Atrium: Right atrial size was normal in size. Pericardium: Trivial pericardial effusion is present. Presence of pericardial fat pad. Mitral Valve: The mitral valve is degenerative in appearance. Mild to moderate mitral annular calcification. No evidence of mitral valve regurgitation. No evidence of mitral valve stenosis. Tricuspid Valve: The tricuspid valve is grossly normal. Tricuspid valve regurgitation is mild . No evidence of tricuspid stenosis. Aortic Valve: The aortic valve is tricuspid. Aortic valve regurgitation is not visualized. Moderate aortic stenosis is present. Aortic valve mean gradient measures 25.7 mmHg. Aortic valve peak gradient measures 41.1 mmHg. Aortic valve area, by VTI measures 1.08 cm. Pulmonic Valve: The pulmonic valve was grossly normal. Pulmonic valve regurgitation is not visualized. No evidence of pulmonic  stenosis. Aorta: The aortic root and ascending aorta are structurally normal, with no evidence of dilitation. Venous: The inferior vena cava is normal in size with greater than 50% respiratory variability, suggesting right atrial pressure of 3 mmHg. IAS/Shunts: The atrial septum is grossly normal.  LEFT  VENTRICLE PLAX 2D LVIDd:         4.40 cm     Diastology LVIDs:         3.00 cm     LV e' lateral:   7.29 cm/s LV PW:         1.40 cm     LV E/e' lateral: 14.0 LV IVS:        1.40 cm     LV e' medial:    6.31 cm/s LVOT diam:     2.00 cm     LV E/e' medial:  16.2 LV SV:         93 LV SV Index:   41          2D Longitudinal Strain LVOT Area:     3.14 cm    2D Strain GLS Avg:     -24.5 %  LV Volumes (MOD) LV vol d, MOD A2C: 80.8 ml LV vol d, MOD A4C: 89.3 ml LV vol s, MOD A2C: 29.1 ml LV vol s, MOD A4C: 35.0 ml LV SV MOD A2C:     51.7 ml LV SV MOD A4C:     89.3 ml LV SV MOD BP:      52.9 ml RIGHT VENTRICLE RV S prime:     9.46 cm/s TAPSE (M-mode): 2.8 cm LEFT ATRIUM             Index       RIGHT ATRIUM           Index LA diam:        4.30 cm 1.91 cm/m  RA Area:     14.80 cm LA Vol (A2C):   53.6 ml 23.77 ml/m RA Volume:   37.60 ml  16.68 ml/m LA Vol (A4C):   81.9 ml 36.32 ml/m LA Biplane Vol: 66.0 ml 29.27 ml/m  AORTIC VALVE AV Area (Vmax):    1.15 cm AV Area (Vmean):   1.06 cm AV Area (VTI):     1.08 cm AV Vmax:           320.67 cm/s AV Vmean:          243.200 cm/s AV VTI:            0.866 m AV Peak Grad:      41.1 mmHg AV Mean Grad:      25.7 mmHg LVOT Vmax:         117.00 cm/s LVOT Vmean:        82.200 cm/s LVOT VTI:          0.297 m LVOT/AV VTI ratio: 0.34  AORTA Ao Root diam: 3.40 cm Ao Asc diam:  3.20 cm MITRAL VALVE MV Area (PHT): 3.48 cm     SHUNTS MV Decel Time: 218 msec     Systemic VTI:  0.30 m MV E velocity: 102.00 cm/s  Systemic Diam: 2.00 cm MV A velocity: 106.00 cm/s MV E/A ratio:  0.96 Eleonore Chiquito MD Electronically signed by Eleonore Chiquito MD Signature Date/Time: 07/03/2019/2:50:52 PM    Final    DG HIP OPERATIVE UNILAT W OR W/O PELVIS LEFT  Result Date: 07/09/2019 CLINICAL DATA:  Left hip osteoarthritis EXAM: OPERATIVE LEFT HIP WITH PELVIS  COMPARISON:  None. FLUOROSCOPY TIME:  Radiation Exposure Index (as provided by the fluoroscopic device): 2.69 mGy If the device does not provide the  exposure index: Fluoroscopy Time:  12 seconds Number of Acquired Images:  2 FINDINGS: Left hip prosthesis is noted in satisfactory position. No acute bony or soft tissue abnormality is noted. Radiopaque sponge is noted over the right hemipelvis likely extrinsic to the patient. Clinical correlation is recommended. IMPRESSION: Left hip prosthesis in satisfactory position. Radiopaque sponge over the right hemipelvis. This is likely extrinsic to the patient. Correlation is recommended. Electronically Signed   By: Inez Catalina M.D.   On: 07/09/2019 12:58    Disposition: HOME  Discharge Instructions    Call MD / Call 911   Complete by: As directed    If you experience chest pain or shortness of breath, CALL 911 and be transported to the hospital emergency room.  If you develope a fever above 101 F, pus (white drainage) or increased drainage or redness at the wound, or calf pain, call your surgeon's office.   Change dressing   Complete by: As directed    Maintain surgical dressing until follow up in the clinic. If the edges start to pull up, may reinforce with tape. If the dressing is no longer working, may remove and cover with gauze and tape, but must keep the area dry and clean.  Call with any questions or concerns.   Constipation Prevention   Complete by: As directed    Drink plenty of fluids.  Prune juice may be helpful.  You may use a stool softener, such as Colace (over the counter) 100 mg twice a day.  Use MiraLax (over the counter) for constipation as needed.   Diet - low sodium heart healthy   Complete by: As directed    Discharge instructions   Complete by: As directed    Maintain surgical dressing until follow up in the clinic. If the edges start to pull up, may reinforce with tape. If the dressing is no longer working, may remove and cover with gauze and tape, but must keep the area dry and clean.  Follow up in 2 weeks at [facility]. Call with any questions or concerns.   Increase activity  slowly as tolerated   Complete by: As directed    Weight bearing as tolerated with assist device (walker, cane, etc) as directed, use it as long as suggested by your surgeon or therapist, typically at least 4-6 weeks.   TED hose   Complete by: As directed    Use stockings (TED hose) for 2 weeks on both leg(s).  You may remove them at night for sleeping.       Follow-up Information    Paralee Cancel, MD. Schedule an appointment as soon as possible for a visit in 2 weeks.   Specialty: Orthopedic Surgery Contact information: [address] [place] [address] [phone]                Signed: Lucille Passy [facility] 07/10/2019, 8:22 AM

## 2019-07-10 NOTE — Progress Notes (Signed)
Physical Therapy Treatment Patient Details Name: Linda Allen MRN: [id number] DOB: 21-Apr-1945 Today's Date: 07/10/2019    History of Present Illness Patient is 74 y.o. female s/p Lt THA anterior approach on 07/09/19 with PMH significant for scleroderma, HTN, OA.    PT Comments    POD # 1 am session Assisted OOB. General bed mobility comments: demonstarted and instructted how to use belt to self assist LE.  Assisted with amb.  General Gait Details: 25% VC's on proper sequencing and safety with turns.  Assisted with amb to bathroom as well. Then returned to room to perform some TE's following HEP handout.  Instructed on proper tech, freq as well as use of ICE.   Pt will need another PT session to complete HEP and practice one step.    Follow Up Recommendations  Follow surgeon's recommendation for DC plan and follow-up therapies     Equipment Recommendations  None recommended by PT    Recommendations for Other Services       Precautions / Restrictions Precautions Precautions: Fall Restrictions Weight Bearing Restrictions: No Other Position/Activity Restrictions: WBAT    Mobility  Bed Mobility Overal bed mobility: Needs Assistance Bed Mobility: Supine to Sit;Sit to Supine     Supine to sit: Supervision;Min guard     General bed mobility comments: demonstarted and instructted how to use belt to self assist LE  Transfers Overall transfer level: Needs assistance Equipment used: Rolling walker (2 wheeled) Transfers: Sit to/from [facility] Sit to Stand: Supervision Stand pivot transfers: Min guard;Supervision       General transfer comment: 25% VC's on proper hand placement and increased time  Ambulation/Gait Ambulation/Gait assistance: Supervision;Min guard Gait Distance (Feet): 24 Feet Assistive device: Rolling walker (2 wheeled) Gait Pattern/deviations: Step-to pattern;Decreased stance time - left Gait velocity: decreased   General Gait  Details: 25% VC's on proper sequencing and safety with turns.  Assisted with amb to bathroom as well.   Stairs             Wheelchair Mobility    Modified Rankin (Stroke Patients Only)       Balance                                            Cognition Arousal/Alertness: Awake/alert Behavior During Therapy: WFL for tasks assessed/performed Overall Cognitive Status: Within Functional Limits for tasks assessed                                 General Comments: A X O x 4 pleasant      Exercises   Total Hip Replacement TE's following HEP Handout 10 reps ankle pumps 05 reps knee presses 05 reps heel slides 05 reps SAQ's 05 reps ABD Instructed how to use a belt loop to assist  Followed by ICE     General Comments        Pertinent Vitals/Pain Pain Assessment: 0-10 Pain Score: 3  Pain Location: Lt hip Pain Descriptors / Indicators: Aching;Burning;Discomfort Pain Intervention(s): Monitored during session;Premedicated before session;Repositioned;Ice applied    Home Living                      Prior Function            PT Goals (current goals can now be found  in the care plan section) Progress towards PT goals: Progressing toward goals    Frequency    7X/week      PT Plan Current plan remains appropriate    Co-evaluation              AM-PAC PT "6 Clicks" Mobility   Outcome Measure  Help needed turning from your back to your side while in a flat bed without using bedrails?: A Little Help needed moving from lying on your back to sitting on the side of a flat bed without using bedrails?: A Little Help needed moving to and from a bed to a chair (including a wheelchair)?: A Little Help needed standing up from a chair using your arms (e.g., wheelchair or bedside chair)?: A Little Help needed to walk in hospital room?: A Little Help needed climbing 3-5 steps with a railing? : A Little 6 Click Score: 18     End of Session Equipment Utilized During Treatment: Gait belt Activity Tolerance: Patient tolerated treatment well Patient left: in chair;with call bell/phone within reach Nurse Communication: Mobility status PT Visit Diagnosis: Muscle weakness (generalized) (M62.81);Difficulty in walking, not elsewhere classified (R26.2);Pain Pain - Right/Left: Left Pain - part of body: Hip     Time: 1000-1025 PT Time Calculation (min) (ACUTE ONLY): 25 min  Charges:  $Gait Training: 8-22 mins $Therapeutic Exercise: 8-22 mins                     Rica Koyanagi  PTA Acute  Rehabilitation Services Pager      [phone] Office      [phone]

## 2019-07-10 NOTE — TOC Transition Note (Signed)
Transition of Care [facility]) - CM/SW Discharge Note   Patient Details  Name: Linda Allen MRN: [id number] Date of Birth: 1945/04/17  [facility] of [facility]) CM/SW Contact:  Lennart Pall, LCSW Phone Number: 07/10/2019, 9:42 AM   Clinical Narrative:   No TOC needs (see below).    Final next level of care: Home/Self Care Barriers to Discharge: No Barriers Identified   Patient Goals and CMS Choice        Discharge Placement                       Discharge Plan and Services                DME Arranged: N/A (has needed DME already) DME Agency: NA       HH Arranged: NA (home with HEP) [place] Agency: NA        Social Determinants of Health (SDOH) Interventions     Readmission Risk Interventions No flowsheet data found.

## 2019-07-10 NOTE — Progress Notes (Signed)
Physical Therapy Treatment Patient Details Name: Linda Allen MRN: [id number] DOB: 02-04-1945 Today's Date: 07/10/2019    History of Present Illness Patient is 74 y.o. female s/p Lt THA anterior approach on 07/09/19 with PMH significant for scleroderma, HTN, OA.    PT Comments    POD # 1 pm session  Assissted with amb to and from bathroom, in hallway and practiced one step.  Then returned to room to perform some TE's following HEP handout.  Instructed on proper tech, freq as well as use of ICE.   Addressed all mobility questions, discussed appropriate activity, educated on use of ICE.  Pt ready for D/C to home.   Follow Up Recommendations  Follow surgeon's recommendation for DC plan and follow-up therapies     Equipment Recommendations  None recommended by PT    Recommendations for Other Services       Precautions / Restrictions Precautions Precautions: Fall Restrictions Weight Bearing Restrictions: No Other Position/Activity Restrictions: WBAT    Mobility  Bed Mobility Overal bed mobility: Needs Assistance Bed Mobility: Supine to Sit;Sit to Supine     Supine to sit: Supervision;Min guard     General bed mobility comments: pt OOB in recliner  Transfers Overall transfer level: Needs assistance Equipment used: Rolling walker (2 wheeled) Transfers: Sit to/from [facility] Sit to Stand: Supervision Stand pivot transfers: Min guard;Supervision       General transfer comment: 25% VC's on proper hand placement and increased time  Ambulation/Gait Ambulation/Gait assistance: Supervision;Min guard Gait Distance (Feet): 26 Feet Assistive device: Rolling walker (2 wheeled) Gait Pattern/deviations: Step-to pattern;Decreased stance time - left Gait velocity: decreased   General Gait Details: 25% VC's on proper sequencing and safety with turns.  Assisted with amb to bathroom as well.   Stairs Stairs: Yes Stairs assistance: Supervision;Min  guard Stair Management: No rails;Step to pattern;Forwards Number of Stairs: 1 General stair comments: 25% VC's on proper walker placement and proper sequencing.   Wheelchair Mobility    Modified Rankin (Stroke Patients Only)       Balance                                            Cognition Arousal/Alertness: Awake/alert Behavior During Therapy: WFL for tasks assessed/performed Overall Cognitive Status: Within Functional Limits for tasks assessed                                 General Comments: A X O x 4 pleasant      Exercises  5 reps standing TE's     General Comments        Pertinent Vitals/Pain Pain Assessment: 0-10 Pain Score: 3  Pain Location: Lt hip Pain Descriptors / Indicators: Aching;Burning;Discomfort Pain Intervention(s): Monitored during session;Premedicated before session;Repositioned;Ice applied    Home Living                      Prior Function            PT Goals (current goals can now be found in the care plan section) Progress towards PT goals: Progressing toward goals    Frequency    7X/week      PT Plan Current plan remains appropriate    Co-evaluation  AM-PAC PT "6 Clicks" Mobility   Outcome Measure  Help needed turning from your back to your side while in a flat bed without using bedrails?: A Little Help needed moving from lying on your back to sitting on the side of a flat bed without using bedrails?: A Little Help needed moving to and from a bed to a chair (including a wheelchair)?: A Little Help needed standing up from a chair using your arms (e.g., wheelchair or bedside chair)?: A Little Help needed to walk in hospital room?: A Little Help needed climbing 3-5 steps with a railing? : A Little 6 Click Score: 18    End of Session Equipment Utilized During Treatment: Gait belt Activity Tolerance: Patient tolerated treatment well Patient left: in chair;with call  bell/phone within reach Nurse Communication: Mobility status (pt ready for D/C to home) PT Visit Diagnosis: Muscle weakness (generalized) (M62.81);Difficulty in walking, not elsewhere classified (R26.2);Pain Pain - Right/Left: Left Pain - part of body: Hip     Time: 1310-1335 PT Time Calculation (min) (ACUTE ONLY): 25 min  Charges:  $Gait Training: 8-22 mins $Therapeutic Exercise: 8-22 mins                     Rica Koyanagi  PTA Acute  Rehabilitation Services Pager      [phone] Office      [phone]

## 2019-07-10 NOTE — Progress Notes (Signed)
     Subjective: 1 Day Post-Op Procedure(s) (LRB): TOTAL HIP ARTHROPLASTY ANTERIOR APPROACH (Left)   Patient reports pain as mild, controlled with medication.  No reported events throughout the night.  Dr. Alvan Dame discussed the procedure, findings and expectations moving forward.  Ready to be discharged home, if they do well with PT.  Follow up in the clinic in 2 weeks.  Knows to call with any questions or concerns.    Objective:   VITALS:   Vitals:   07/10/19 0159 07/10/19 0625  BP: (!) 159/69 (!) 143/73  Pulse: 68 68  Resp: 16 16  Temp: 97.9 F (36.6 C) 98.2 F (36.8 C)  SpO2: 99% 100%    Dorsiflexion/Plantar flexion intact Incision: dressing C/D/I No cellulitis present Compartment soft  LABS Recent Labs    07/10/19 0251  HGB 10.9*  HCT 33.9*  WBC 7.4  PLT 235    Recent Labs    07/10/19 0251  NA 134*  K 5.0  BUN 31*  CREATININE 1.00  GLUCOSE 166*     Assessment/Plan: 1 Day Post-Op Procedure(s) (LRB): TOTAL HIP ARTHROPLASTY ANTERIOR APPROACH (Left) Foley cath d/c'ed Advance diet Up with therapy D/C IV fluids Discharge home Follow up in 2 weeks at [facility] Follow up with OLIN,Deondrick Searls D in 2 weeks.  Contact information:  [facility] [address], [address] [phone]    Obese (BMI 30-39.9) Estimated body mass index is 38.32 kg/m as calculated from the following:   Height as of this encounter: 5\' 8"  (1.727 m).   Weight as of this encounter: 114.3 kg. Patient also counseled that weight may inhibit the healing process Patient counseled that losing weight will help with future health issues      Danae Orleans PA-C  [facility]  [place] [address], [place], [place] [address] Phone: [phone] [email or web address] Facebook  [employer]

## 2019-09-12 NOTE — H&P (Signed)
Patient's anticipated LOS is less than 2 midnights, meeting these requirements: - Younger than 91 - Lives within 1 hour of care - Has a competent adult at home to recover with post-op recover - NO history of  - Chronic pain requiring opiods  - Diabetes  - Coronary Artery Disease  - Heart failure  - Heart attack  - Stroke  - DVT/VTE  - Cardiac arrhythmia  - Respiratory Failure/COPD  - Renal failure  - Anemia  - Advanced Liver disease       Linda Allen is an 74 y.o. female.    Chief Complaint: left shoulder pain  HPI: Pt is a 74 y.o. female complaining of left shoulder pain for multiple years. Pain had continually increased since the beginning. X-rays in the clinic show end-stage arthritic changes of the left shoulder. Pt has tried various conservative treatments which have failed to alleviate their symptoms, including injections and therapy. Various options are discussed with the patient. Risks, benefits and expectations were discussed with the patient. Patient understand the risks, benefits and expectations and wishes to proceed with surgery.   PCP:  Shelby Mattocks, PA-C  D/C Plans: Home  PMH: Past Medical History:  Diagnosis Date  . Allergy   . Arthritis    L hip  . Heart murmur   . Hypertension   . Scleroderma (HCC)     PSH: Past Surgical History:  Procedure Laterality Date  . ENDOVENOUS ABLATION SAPHENOUS VEIN W/ LASER Left 09/26/2018   endovenous laser ablation left greater saphenous vein by Ruta Hinds MD   . ENDOVENOUS ABLATION SAPHENOUS VEIN W/ LASER Right 10/17/2018   endovenous laser ablation right greater saphenous vein by Ruta Hinds MD   . JOINT REPLACEMENT  2011, 2012   both knees  . SHOULDER ACROMIOPLASTY Right 2012  . TOTAL HIP ARTHROPLASTY Left 07/09/2019   Procedure: TOTAL HIP ARTHROPLASTY ANTERIOR APPROACH;  Surgeon: Paralee Cancel, MD;  Location: [facility];  Service: Orthopedics;  Laterality: Left;  70 mins    Social  History:  reports that she has quit smoking. She quit after 10.00 years of use. She has never used smokeless tobacco. She reports current alcohol use. She reports that she does not use drugs.  Allergies:  Allergies  Allergen Reactions  . Bee Venom Shortness Of Breath  . Ivp Dye [Iodinated Diagnostic Agents] Shortness Of Breath and Rash  . Penicillins Hives and Rash    Tolerated Cephalosporin Date: 07/09/19.      Medications: No current facility-administered medications for this encounter.   Current Outpatient Medications  Medication Sig Dispense Refill  . acetaminophen (TYLENOL) 500 MG tablet Take 2 tablets (1,000 mg total) by mouth every 8 (eight) hours. 30 tablet 0  . docusate sodium (COLACE) 100 MG capsule Take 1 capsule (100 mg total) by mouth 2 (two) times daily. 28 capsule 0  . ferrous sulfate (FERROUSUL) 325 (65 FE) MG tablet Take 1 tablet (325 mg total) by mouth 3 (three) times daily with meals for 14 days. 42 tablet 0  . methocarbamol (ROBAXIN) 500 MG tablet Take 1 tablet (500 mg total) by mouth every 6 (six) hours as needed for muscle spasms. 40 tablet 0  . polyethylene glycol (MIRALAX / GLYCOLAX) 17 g packet Take 17 g by mouth 2 (two) times daily. 28 packet 0  . traMADol (ULTRAM) 50 MG tablet Take 1-2 tablets (50-100 mg total) by mouth every 6 (six) hours as needed. 40 tablet 0    No results found for this  or any previous visit (from the past 48 hour(s)). No results found.  ROS: Pain with rom of the left upper extremity  Physical Exam: Alert and oriented 74 y.o. female in no acute distress Cranial nerves 2-12 intact Cervical spine: full rom with no tenderness, nv intact distally Chest: active breath sounds bilaterally, no wheeze rhonchi or rales Heart: regular rate and rhythm, no murmur Abd: non tender non distended with active bowel sounds Hip is stable with rom  Left shoulder painful and weak rom nv intact distally No rashes or edema    Assessment/Plan Assessment: left shoulder cuff arthropathy  Plan:  Patient will undergo a left reverse total shoulder by Dr. Veverly Fells at [facility] Risks benefits and expectations were discussed with the patient. Patient understand risks, benefits and expectations and wishes to proceed. Preoperative templating of the joint replacement has been completed, documented, and submitted to the Operating Room personnel in order to optimize intra-operative equipment management.   Merla Riches PA-C, MPAS [facility] is now [employer] [address], [place], [place], [place] [address] Phone: [phone] [email or web address] Facebook  [employer]

## 2019-09-13 NOTE — Patient Instructions (Addendum)
DUE TO COVID-19 ONLY ONE VISITOR IS ALLOWED TO COME WITH YOU AND STAY IN THE WAITING ROOM ONLY DURING PRE OP AND PROCEDURE DAY OF SURGERY. THE 1 VISITOR  MAY VISIT WITH YOU AFTER SURGERY IN YOUR PRIVATE ROOM DURING VISITING HOURS ONLY!  YOU NEED TO HAVE A COVID 19 TEST ON_9/7______ @_9 :25______, THIS TEST MUST BE DONE BEFORE SURGERY,  COVID TESTING SITE [place] [address], IT IS ON THE RIGHT GOING OUT [address] APPROXIMATELY  2 MINUTES PAST [employer] ON THE RIGHT. ONCE YOUR COVID TEST IS COMPLETED,  PLEASE BEGIN THE QUARANTINE INSTRUCTIONS AS OUTLINED IN YOUR HANDOUT.                [place]    Your procedure is scheduled on: 09/20/19   Report to [facility] Main  Entrance   Report to admitting at 7:30 AM     Call this number if you have problems the morning of surgery [place], NO CHEWING GUM [place].   No food after midnight.    You may have clear liquid until 7:00 AM.    At 7:00  AM drink pre surgery drink  . Nothing by mouth after 7:00 AM.   Take these medicines the morning of surgery with A SIP OF WATER: None                                 You may not have any metal on your body including hair pins and              piercings  Do not wear jewelry, make-up, lotions, powders or perfumes, deodorant             Do not wear nail polish on your fingernails.  Do not shave  48 hours prior to surgery.             Do not bring valuables to the hospital. [place].  Contacts, dentures or bridgework may not be worn into surgery.       Patients discharged the day of surgery will not be allowed to drive home.   IF YOU ARE HAVING SURGERY AND GOING HOME THE SAME DAY, YOU MUST HAVE AN ADULT TO DRIVE YOU HOME AND BE WITH YOU FOR 24 HOURS.  YOU MAY GO HOME BY TAXI OR UBER OR ORTHERWISE, BUT AN ADULT MUST  ACCOMPANY YOU HOME AND STAY WITH YOU FOR 24 HOURS.  Name and phone number of your driver:  Special Instructions: N/A              Please read over the following fact sheets you were given: _____________________________________________________________________ [facility]- Preparing for Total Shoulder Arthroplasty    Before surgery, you can play an important role. Because skin is not sterile, your skin needs to be as free of germs as possible. You can reduce the number of germs on your skin by using the following products. . Benzoyl Peroxide Gel o Reduces the number of germs present on the skin o Applied twice a day to shoulder area starting two days before surgery    ==================================================================  Please follow these instructions carefully:  BENZOYL PEROXIDE 5% GEL  Please do not use  if you have an allergy to benzoyl peroxide.   If your skin becomes reddened/irritated stop using the benzoyl peroxide.  Starting two days before surgery, apply as follows: 1. Apply benzoyl peroxide in the morning and at night. Apply after taking a shower. If you are not taking a shower clean entire shoulder front, back, and side along with the armpit with a clean wet washcloth.  2. Place a quarter-sized dollop on your shoulder and rub in thoroughly, making sure to cover the front, back, and side of your shoulder, along with the armpit.   2 days before ____ AM   ____ PM              1 day before ____ AM   ____ PM                         3. Do this twice a day for two days.  (Last application is the night before surgery, AFTER using the CHG soap as described below).  4. Do NOT apply benzoyl peroxide gel on the day of surgery.   [place] - Preparing for Surgery  Before surgery, you can play an important role.   Because skin is not sterile, your skin needs to be as free of germs as possible .  You can reduce the number of germs on your skin by washing with CHG  (chlorahexidine gluconate) soap before surgery.   CHG is an antiseptic cleaner which kills germs and bonds with the skin to continue killing germs even after washing. Please DO NOT use if you have an allergy to CHG or antibacterial soaps.   If your skin becomes reddened/irritated stop using the CHG and inform your nurse when you arrive at Short Stay. Do not shave (including legs and underarms) for at least 48 hours prior to the first CHG shower.   Please follow these instructions carefully:   1.  Shower with CHG Soap the night before surgery and the  morning of Surgery.  2.  If you choose to wash your hair, wash your hair first as usual with your  normal  shampoo.  3.  After you shampoo, rinse your hair and body thoroughly to remove the  shampoo.                                        4.  Use CHG as you would any other liquid soap.  You can apply chg directly  to the skin and wash                       Gently with a scrungie or clean washcloth.  5.  Apply the CHG Soap to your body ONLY FROM THE NECK DOWN.   Do not use on face/ open                           Wound or open sores. Avoid contact with eyes, ears mouth and genitals (private parts).                       Wash face,  Genitals (private parts) with your normal soap.             6.  Wash thoroughly, paying special attention to the area where your surgery  will  be performed.  7.  Thoroughly rinse your body with warm water from the neck down.  8.  DO NOT shower/wash with your normal soap after using and rinsing off  the CHG Soap.             9.  Pat yourself dry with a clean towel.            10.  Wear clean pajamas.            11.  Place clean sheets on your bed the night of your first shower and do not  sleep with pets. Day of Surgery : Do not apply any lotions/deodorants the morning of surgery.  Please wear clean clothes to the hospital/surgery center.  FAILURE TO FOLLOW THESE INSTRUCTIONS MAY RESULT IN THE CANCELLATION OF YOUR  SURGERY PATIENT SIGNATURE_________________________________  NURSE SIGNATURE__________________________________  ________________________________________________________________________   Adam Phenix  An incentive spirometer is a tool that can help keep your lungs clear and active. This tool measures how well you are filling your lungs with each breath. Taking long deep breaths may help reverse or decrease the chance of developing breathing (pulmonary) problems (especially infection) following:  A long period of time when you are unable to move or be active. BEFORE THE PROCEDURE   If the spirometer includes an indicator to show your best effort, your nurse or respiratory therapist will set it to a desired goal.  If possible, sit up straight or lean slightly forward. Try not to slouch.  Hold the incentive spirometer in an upright position. INSTRUCTIONS FOR USE  1. Sit on the edge of your bed if possible, or sit up as far as you can in bed or on a chair. 2. Hold the incentive spirometer in an upright position. 3. Breathe out normally. 4. Place the mouthpiece in your mouth and seal your lips tightly around it. 5. Breathe in slowly and as deeply as possible, raising the piston or the ball toward the top of the column. 6. Hold your breath for 3-5 seconds or for as long as possible. Allow the piston or ball to fall to the bottom of the column. 7. Remove the mouthpiece from your mouth and breathe out normally. 8. Rest for a few seconds and repeat Steps 1 through 7 at least 10 times every 1-2 hours when you are awake. Take your time and take a few normal breaths between deep breaths. 9. The spirometer may include an indicator to show your best effort. Use the indicator as a goal to work toward during each repetition. 10. After each set of 10 deep breaths, practice coughing to be sure your lungs are clear. If you have an incision (the cut made at the time of surgery), support your  incision when coughing by placing a pillow or rolled up towels firmly against it. Once you are able to get out of bed, walk around indoors and cough well. You may stop using the incentive spirometer when instructed by your caregiver.  RISKS AND COMPLICATIONS  Take your time so you do not get dizzy or light-headed.  If you are in pain, you may need to take or ask for pain medication before doing incentive spirometry. It is harder to take a deep breath if you are having pain. AFTER USE  Rest and breathe slowly and easily.  It can be helpful to keep track of a log of your progress. Your caregiver can provide you with a simple table to help with this. If you are  using the spirometer at home, follow these instructions: [place] IF:   You are having difficultly using the spirometer.  You have trouble using the spirometer as often as instructed.  Your pain medication is not giving enough relief while using the spirometer.  You develop fever of 100.5 F (38.1 C) or higher. SEEK IMMEDIATE MEDICAL CARE IF:   You cough up bloody sputum that had not been present before.  You develop fever of 102 F (38.9 C) or greater.  You develop worsening pain at or near the incision site. MAKE SURE YOU:   Understand these instructions.  Will watch your condition.  Will get help right away if you are not doing well or get worse. Document Released: 05/09/2006 Document Revised: 03/21/2011 Document Reviewed: 07/10/2006 [facility] Patient Information 2014 [place], [place].   ________________________________________________________________________

## 2019-09-17 ENCOUNTER — Other Ambulatory Visit (HOSPITAL_COMMUNITY): Payer: Medicare PPO

## 2019-09-17 ENCOUNTER — Encounter (HOSPITAL_COMMUNITY)
Admission: RE | Admit: 2019-09-17 | Discharge: 2019-09-17 | Disposition: A | Discharge status: Home / Self Care | Payer: Medicare PPO | Source: Ambulatory Visit | Attending: Orthopedic Surgery | Admitting: Orthopedic Surgery

## 2019-09-17 NOTE — Patient Instructions (Addendum)
DUE TO COVID-19 ONLY ONE VISITOR IS ALLOWED TO COME WITH YOU AND STAY IN THE WAITING ROOM ONLY DURING PRE OP AND PROCEDURE DAY OF SURGERY. THE 1 VISITOR  MAY VISIT WITH YOU AFTER SURGERY IN YOUR PRIVATE ROOM DURING VISITING HOURS ONLY!   ONCE YOUR COVID TEST IS COMPLETED,  PLEASE BEGIN THE QUARANTINE INSTRUCTIONS AS OUTLINED IN YOUR HANDOUT.                [place]    Your procedure is scheduled on: 09/20/19   Report to [facility] Main  Entrance   Report to admitting at  7:30 AM     Call this number if you have problems the morning of surgery [place], NO CHEWING GUM [place].   No food after midnight.      CLEAR LIQUID DIET   Foods Allowed                                                                     Foods Excluded  Coffee and tea, regular and decaf                             liquids that you cannot  Plain Jell-O any favor except red or purple                                           see through such as: Fruit ices (not with fruit pulp)                                     milk, soups, orange juice  Iced Popsicles                                    All solid food Carbonated beverages, regular and diet                                    Cranberry, grape and apple juices Sports drinks like Gatorade Lightly seasoned clear broth or consume(fat free) Sugar, honey syrup      You may have clear liquid until 7:00 AM.    At 7:00  AM drink pre surgery drink   Nothing by mouth after 7:00 AM.    Take these medicines the morning of surgery with A SIP OF WATER:                                  You may not have any metal on your body including hair pins and              piercings  Do not wear jewelry, make-up, lotions, powders or perfumes, deodorant  Do not wear nail polish on your fingernails.  Do not shave  48 hours prior to surgery.                Do not bring  valuables to the hospital. [place].  Contacts, dentures or bridgework may not be worn into surgery.       Patients discharged the day of surgery will not be allowed to drive home  . IF YOU ARE HAVING SURGERY AND GOING HOME THE SAME DAY, YOU MUST HAVE AN ADULT TO DRIVE YOU HOME AND BE WITH YOU FOR 24 HOURS.  YOU MAY GO HOME BY TAXI OR UBER OR ORTHERWISE, BUT AN ADULT MUST ACCOMPANY YOU HOME AND STAY WITH YOU FOR 24 HOURS.  Name and phone number of your driver:  Special Instructions: N/A              Please read over the following fact sheets you were given: _____________________________________________________________________ [facility]- Preparing for Total Shoulder Arthroplasty    Before surgery, you can play an important role. Because skin is not sterile, your skin needs to be as free of germs as possible. You can reduce the number of germs on your skin by using the following products. . Benzoyl Peroxide Gel o Reduces the number of germs present on the skin o Applied twice a day to shoulder area starting two days before surgery    ==================================================================  Please follow these instructions carefully:  BENZOYL PEROXIDE 5% GEL  Please do not use if you have an allergy to benzoyl peroxide.   If your skin becomes reddened/irritated stop using the benzoyl peroxide.  Starting two days before surgery, apply as follows: 1. Apply benzoyl peroxide in the morning and at night. Apply after taking a shower. If you are not taking a shower clean entire shoulder front, back, and side along with the armpit with a clean wet washcloth.  2. Place a quarter-sized dollop on your shoulder and rub in thoroughly, making sure to cover the front, back, and side of your shoulder, along with the armpit.   2 days before ____ AM   ____ PM              1 day before ____ AM   ____ PM                         3. Do this  twice a day for two days.  (Last application is the night before surgery, AFTER using the CHG soap as described below).  4. Do NOT apply benzoyl peroxide gel on the day of surgery.             [place] - Preparing for Surgery  Before surgery, you can play an important role.   Because skin is not sterile, your skin needs to be as free of germs as possible.   You can reduce the number of germs on your skin by washing with CHG (chlorahexidine gluconate) soap before surgery.   CHG is an antiseptic cleaner which kills germs and bonds with the skin to continue killing germs even after washing. Please DO NOT use if you have an allergy to CHG or antibacterial soaps.   If your skin becomes reddened/irritated stop using the CHG and inform your nurse when you arrive at Short Stay. Do not shave (including legs and underarms)  for at least 48 hours prior to the first CHG shower.   . Please follow these instructions carefully:  1.  Shower with CHG Soap the night before surgery and the  morning of Surgery.  2.  If you choose to wash your hair, wash your hair first as usual with your  normal  shampoo.  3.  After you shampoo, rinse your hair and body thoroughly to remove the  shampoo.                                        4.  Use CHG as you would any other liquid soap.  You can apply chg directly  to the skin and wash                       Gently with a scrungie or clean washcloth.  5.  Apply the CHG Soap to your body ONLY FROM THE NECK DOWN.   Do not use on face/ open                           Wound or open sores. Avoid contact with eyes, ears mouth and genitals (private parts).                       Wash face,  Genitals (private parts) with your normal soap.             6.  Wash thoroughly, paying special attention to the area where your surgery  will be performed.  7.  Thoroughly rinse your body with warm water from the neck down.  8.  DO NOT shower/wash with your normal soap after using and rinsing  off  the CHG Soap.             9.  Pat yourself dry with a clean towel.            10.  Wear clean pajamas.            11.  Place clean sheets on your bed the night of your first shower and do not  sleep with pets. Day of Surgery : Do not apply any lotions/deodorants the morning of surgery.  Please wear clean clothes to the hospital/surgery center.  FAILURE TO FOLLOW THESE INSTRUCTIONS MAY RESULT IN THE CANCELLATION OF YOUR SURGERY PATIENT SIGNATURE_________________________________  NURSE SIGNATURE__________________________________  ________________________________________________________________________

## 2019-09-18 ENCOUNTER — Encounter (HOSPITAL_COMMUNITY)
Admission: RE | Admit: 2019-09-18 | Discharge: 2019-09-18 | Disposition: A | Discharge status: Home / Self Care | Payer: Medicare PPO | Source: Ambulatory Visit | Attending: Orthopedic Surgery | Admitting: Orthopedic Surgery

## 2019-09-18 ENCOUNTER — Encounter (HOSPITAL_COMMUNITY): Payer: Self-pay

## 2019-09-18 ENCOUNTER — Other Ambulatory Visit (HOSPITAL_COMMUNITY)
Admission: RE | Admit: 2019-09-18 | Discharge: 2019-09-18 | Disposition: A | Discharge status: Home / Self Care | Payer: Medicare PPO | Source: Ambulatory Visit | Attending: Orthopedic Surgery | Admitting: Orthopedic Surgery

## 2019-09-18 ENCOUNTER — Other Ambulatory Visit: Payer: Self-pay

## 2019-09-18 DIAGNOSIS — Z01812 Encounter for preprocedural laboratory examination: Secondary | ICD-10-CM | POA: Insufficient documentation

## 2019-09-18 DIAGNOSIS — Z96642 Presence of left artificial hip joint: Secondary | ICD-10-CM | POA: Diagnosis not present

## 2019-09-18 DIAGNOSIS — Z96653 Presence of artificial knee joint, bilateral: Secondary | ICD-10-CM | POA: Insufficient documentation

## 2019-09-18 DIAGNOSIS — I350 Nonrheumatic aortic (valve) stenosis: Secondary | ICD-10-CM | POA: Diagnosis not present

## 2019-09-18 DIAGNOSIS — I10 Essential (primary) hypertension: Secondary | ICD-10-CM | POA: Insufficient documentation

## 2019-09-18 DIAGNOSIS — Z96611 Presence of right artificial shoulder joint: Secondary | ICD-10-CM | POA: Diagnosis not present

## 2019-09-18 DIAGNOSIS — R011 Cardiac murmur, unspecified: Secondary | ICD-10-CM | POA: Diagnosis not present

## 2019-09-18 DIAGNOSIS — M75102 Unspecified rotator cuff tear or rupture of left shoulder, not specified as traumatic: Secondary | ICD-10-CM | POA: Insufficient documentation

## 2019-09-18 DIAGNOSIS — Z20822 Contact with and (suspected) exposure to covid-19: Secondary | ICD-10-CM | POA: Diagnosis not present

## 2019-09-18 LAB — CBC
HCT: 38.2 % (ref 36.0–46.0)
Hemoglobin: 12 g/dL (ref 12.0–15.0)
MCH: 33.4 pg (ref 26.0–34.0)
MCHC: 31.4 g/dL (ref 30.0–36.0)
MCV: 106.4 fL — ABNORMAL HIGH (ref 80.0–100.0)
Platelets: 313 10*3/uL (ref 150–400)
RBC: 3.59 MIL/uL — ABNORMAL LOW (ref 3.87–5.11)
RDW: 13.9 % (ref 11.5–15.5)
WBC: 4.8 10*3/uL (ref 4.0–10.5)
nRBC: 0 % (ref 0.0–0.2)

## 2019-09-18 LAB — SARS CORONAVIRUS 2 (TAT 6-24 HRS): SARS Coronavirus 2: NEGATIVE

## 2019-09-18 LAB — BASIC METABOLIC PANEL
Anion gap: 8 (ref 5–15)
BUN: 26 mg/dL — ABNORMAL HIGH (ref 8–23)
CO2: 27 mmol/L (ref 22–32)
Calcium: 9.5 mg/dL (ref 8.9–10.3)
Chloride: 106 mmol/L (ref 98–111)
Creatinine, Ser: 1 mg/dL (ref 0.44–1.00)
GFR calc Af Amer: >60 mL/min (ref >60)
GFR calc non Af Amer: 56 mL/min — ABNORMAL LOW (ref >60)
Glucose, Bld: 97 mg/dL (ref 70–99)
Potassium: 4.5 mmol/L (ref 3.5–5.1)
Sodium: 141 mmol/L (ref 135–145)

## 2019-09-18 LAB — SURGICAL PCR SCREEN
MRSA, PCR: NEGATIVE
Staphylococcus aureus: POSITIVE — AB

## 2019-09-18 NOTE — Progress Notes (Signed)
COVID Vaccine Completed:Yes Date COVID Vaccine completed:04/03/19 COVID vaccine manufacturer: [place]       PCP - Hilton Sinclair PA Cardiologist -Dr. Bearl Mulberry   Chest x-ray - no EKG - 07/02/19 Stress Test - no ECHO - 07/03/19 Cardiac Cath - no  Sleep Study - yes- negative findings CPAP - no  Fasting Blood Sugar - NA Checks Blood Sugar _____ times a day  Blood Thinner Instructions:NA Aspirin Instructions: Last Dose:  Anesthesia review:   Patient denies shortness of breath, fever, cough and chest pain at PAT appointment yes   Patient verbalized understanding of instructions that were given to them at the PAT appointment. Patient was also instructed that they will need to review over the PAT instructions again at home before surgery. Yes  Pt has no SOB with stairs, house work or ADLs

## 2019-09-19 NOTE — Anesthesia Preprocedure Evaluation (Addendum)
Anesthesia Evaluation  Patient identified by MRN, date of birth, ID band Patient awake    Reviewed: Allergy & Precautions, NPO status , Patient's Chart, lab work & pertinent test results  History of Anesthesia Complications Negative for: history of anesthetic complications  Airway Mallampati: III  TM Distance: >3 FB Neck ROM: Full    Dental no notable dental hx. (+) Dental Advisory Given   Pulmonary neg pulmonary ROS, former smoker,    Pulmonary exam normal        Cardiovascular hypertension, Normal cardiovascular exam+ Valvular Problems/Murmurs AS   IMPRESSIONS    1. Left ventricular ejection fraction, by estimation, is 60 to 65%. The  left ventricle has normal function. The left ventricle has no regional  wall motion abnormalities. There is mild concentric left ventricular  hypertrophy. Left ventricular diastolic  parameters are consistent with Grade II diastolic dysfunction  (pseudonormalization). The average left ventricular global longitudinal  strain is -24.5 %. The global longitudinal strain is normal.  2. Right ventricular systolic function is normal. The right ventricular  size is normal. Tricuspid regurgitation signal is inadequate for assessing  PA pressure.  3. Left atrial size was mildly dilated.  4. The mitral valve is degenerative. No evidence of mitral valve  regurgitation. No evidence of mitral stenosis.  5. The aortic valve is tricuspid. Aortic valve regurgitation is not  visualized. Moderate aortic valve stenosis. Aortic valve area, by VTI  measures 1.08 cm. Aortic valve mean gradient measures 25.7 mmHg. Aortic  valve Vmax measures 3.21 m/s.  6. The inferior vena cava is normal in size with greater than 50%  respiratory variability, suggesting right atrial pressure of 3 mmHg.    Neuro/Psych negative neurological ROS     GI/Hepatic negative GI ROS, Neg liver ROS,   Endo/Other  negative  endocrine ROS  Renal/GU negative Renal ROS     Musculoskeletal  (+) Arthritis ,   Abdominal   Peds  Hematology negative hematology ROS (+)   Anesthesia Other Findings Scleroderma  Reproductive/Obstetrics                           Anesthesia Physical Anesthesia Plan  ASA: III  Anesthesia Plan: General   Post-op Pain Management:  Regional for Post-op pain   Induction: Intravenous  PONV Risk Score and Plan: 2 and Ondansetron, Dexamethasone and Diphenhydramine  Airway Management Planned: Oral ETT  Additional Equipment:   Intra-op Plan:   Post-operative Plan: Extubation in OR  Informed Consent: I have reviewed the patients History and Physical, chart, labs and discussed the procedure including the risks, benefits and alternatives for the proposed anesthesia with the patient or authorized representative who has indicated his/her understanding and acceptance.     Dental advisory given  Plan Discussed with: Anesthesiologist and CRNA  Anesthesia Plan Comments: (See PAT note 09/18/2019, Konrad Felix, PA-C)      Anesthesia Quick Evaluation

## 2019-09-19 NOTE — Progress Notes (Signed)
Anesthesia Chart Review   Case: [id number] Date/Time: 09/20/19 0945   Procedure: REVERSE SHOULDER ARTHROPLASTY (Left Shoulder)   Anesthesia type: General   Pre-op diagnosis: Left shoulder rotator cuff insufficiency   Location: [facility] ROOM 09 / [facility]   Surgeons: Netta Cedars, MD      DISCUSSION:74 y.o. former smoker with h/o HTN, moderate AS (Aortic valve area 1.08 cm, Aortic valve mean gradient 25.7 mmHg, Aortic valve Vmax measures 3.21 m/s), left shoulder rotator cuff insufficiency scheduled for above procedure 9/40/2021 with Dr. Netta Cedars.   Last seen by cardiology 07/02/2019 for evaluation of systolic murmur.  Echo 07/03/2019 with moderate AS.  Per Dr. Audie Box, "She may proceed with surgery."  S/p left total hip 07/09/2019 with no anesthesia complications.   Anticipate pt can proceed with planned procedure barring acute status change.   VS: There were no vitals taken for this visit.  PROVIDERS: Minette Brine, FNP is PCP   O'Neal, Cassie Freer, MD is Cardiologist  LABS: Labs reviewed: Acceptable for surgery. (all labs ordered are listed, but only abnormal results are displayed)  Labs Reviewed  SURGICAL PCR SCREEN - Abnormal; Notable for the following components:      Result Value   Staphylococcus aureus POSITIVE (*)    All other components within normal limits  BASIC METABOLIC PANEL - Abnormal; Notable for the following components:   BUN 26 (*)    GFR calc non Af Amer 56 (*)    All other components within normal limits  CBC - Abnormal; Notable for the following components:   RBC 3.59 (*)    MCV 106.4 (*)    All other components within normal limits     IMAGES:   EKG: 07/02/2019 Rate 82 bpm  NSR Possible left atrial enlargement   CV: Echo 07/03/2019 IMPRESSIONS    1. Left ventricular ejection fraction, by estimation, is 60 to 65%. The  left ventricle has normal function. The left ventricle has no regional  wall motion abnormalities. There is mild concentric left  ventricular  hypertrophy. Left ventricular diastolic  parameters are consistent with Grade II diastolic dysfunction  (pseudonormalization). The average left ventricular global longitudinal  strain is -24.5 %. The global longitudinal strain is normal.  2. Right ventricular systolic function is normal. The right ventricular  size is normal. Tricuspid regurgitation signal is inadequate for assessing  PA pressure.  3. Left atrial size was mildly dilated.  4. The mitral valve is degenerative. No evidence of mitral valve  regurgitation. No evidence of mitral stenosis.  5. The aortic valve is tricuspid. Aortic valve regurgitation is not  visualized. Moderate aortic valve stenosis. Aortic valve area, by VTI  measures 1.08 cm. Aortic valve mean gradient measures 25.7 mmHg. Aortic  valve Vmax measures 3.21 m/s.  6. The inferior vena cava is normal in size with greater than 50%  respiratory variability, suggesting right atrial pressure of 3 mmHg. Past Medical History:  Diagnosis Date  . Allergy   . Arthritis    L hip  . Heart murmur   . Hypertension   . Scleroderma [facility])     Past Surgical History:  Procedure Laterality Date  . ENDOVENOUS ABLATION SAPHENOUS VEIN W/ LASER Left 09/26/2018   endovenous laser ablation left greater saphenous vein by Ruta Hinds MD   . ENDOVENOUS ABLATION SAPHENOUS VEIN W/ LASER Right 10/17/2018   endovenous laser ablation right greater saphenous vein by Ruta Hinds MD   . JOINT REPLACEMENT  2011, 2012   both knees  .  SHOULDER ACROMIOPLASTY Right 2012  . TOTAL HIP ARTHROPLASTY Left 07/09/2019   Procedure: TOTAL HIP ARTHROPLASTY ANTERIOR APPROACH;  Surgeon: Paralee Cancel, MD;  Location: [facility];  Service: Orthopedics;  Laterality: Left;  70 mins    MEDICATIONS: . acetaminophen (TYLENOL) 500 MG tablet  . docusate sodium (COLACE) 100 MG capsule  . ferrous sulfate (FERROUSUL) 325 (65 FE) MG tablet  . methocarbamol (ROBAXIN) 500 MG tablet  .  polyethylene glycol (MIRALAX / GLYCOLAX) 17 g packet  . traMADol (ULTRAM) 50 MG tablet   No current facility-administered medications for this encounter.     Konrad Felix, PA-C [facility] Pre[facility]Surgical Testing [phone]

## 2019-09-20 ENCOUNTER — Encounter (HOSPITAL_COMMUNITY): Payer: Self-pay | Admitting: Orthopedic Surgery

## 2019-09-20 ENCOUNTER — Ambulatory Visit (HOSPITAL_COMMUNITY)
Admission: RE | Admit: 2019-09-20 | Discharge: 2019-09-20 | Disposition: A | Discharge status: Home / Self Care | Payer: Medicare PPO | Attending: Orthopedic Surgery | Admitting: Orthopedic Surgery

## 2019-09-20 ENCOUNTER — Encounter (HOSPITAL_COMMUNITY)
Admission: RE | Disposition: A | Discharge status: Home / Self Care | Payer: Self-pay | Source: Home / Self Care | Attending: Orthopedic Surgery

## 2019-09-20 ENCOUNTER — Ambulatory Visit (HOSPITAL_COMMUNITY): Payer: Medicare PPO | Admitting: Anesthesiology

## 2019-09-20 ENCOUNTER — Ambulatory Visit (HOSPITAL_COMMUNITY): Payer: Medicare PPO | Admitting: Physician Assistant

## 2019-09-20 DIAGNOSIS — Z79899 Other long term (current) drug therapy: Secondary | ICD-10-CM | POA: Diagnosis not present

## 2019-09-20 DIAGNOSIS — M24012 Loose body in left shoulder: Secondary | ICD-10-CM | POA: Diagnosis not present

## 2019-09-20 DIAGNOSIS — Z91030 Bee allergy status: Secondary | ICD-10-CM | POA: Insufficient documentation

## 2019-09-20 DIAGNOSIS — M778 Other enthesopathies, not elsewhere classified: Secondary | ICD-10-CM | POA: Insufficient documentation

## 2019-09-20 DIAGNOSIS — Z87891 Personal history of nicotine dependence: Secondary | ICD-10-CM | POA: Insufficient documentation

## 2019-09-20 DIAGNOSIS — M19012 Primary osteoarthritis, left shoulder: Secondary | ICD-10-CM | POA: Insufficient documentation

## 2019-09-20 DIAGNOSIS — M25712 Osteophyte, left shoulder: Secondary | ICD-10-CM | POA: Insufficient documentation

## 2019-09-20 DIAGNOSIS — Z96653 Presence of artificial knee joint, bilateral: Secondary | ICD-10-CM | POA: Diagnosis not present

## 2019-09-20 DIAGNOSIS — Z881 Allergy status to other antibiotic agents status: Secondary | ICD-10-CM | POA: Diagnosis not present

## 2019-09-20 DIAGNOSIS — Z880 Allergy status to penicillin: Secondary | ICD-10-CM | POA: Insufficient documentation

## 2019-09-20 DIAGNOSIS — I10 Essential (primary) hypertension: Secondary | ICD-10-CM | POA: Diagnosis not present

## 2019-09-20 DIAGNOSIS — Z96642 Presence of left artificial hip joint: Secondary | ICD-10-CM | POA: Insufficient documentation

## 2019-09-20 DIAGNOSIS — M349 Systemic sclerosis, unspecified: Secondary | ICD-10-CM | POA: Insufficient documentation

## 2019-09-20 HISTORY — PX: REVERSE SHOULDER ARTHROPLASTY: SHX5054

## 2019-09-20 SURGERY — ARTHROPLASTY, SHOULDER, TOTAL, REVERSE
Anesthesia: General | Site: Shoulder | Laterality: Left

## 2019-09-20 MED ORDER — STERILE WATER FOR IRRIGATION IR SOLN
Status: DC | PRN
  Administered 2019-09-20: 2000 mL

## 2019-09-20 MED ORDER — FENTANYL CITRATE (PF) 250 MCG/5ML IJ SOLN
INTRAMUSCULAR | Status: AC
  Filled 2019-09-20: qty 5

## 2019-09-20 MED ORDER — ROCURONIUM BROMIDE 10 MG/ML (PF) SYRINGE
PREFILLED_SYRINGE | INTRAVENOUS | Status: DC | PRN
  Administered 2019-09-20: 30 mg via INTRAVENOUS
  Administered 2019-09-20 (×2): 10 mg via INTRAVENOUS

## 2019-09-20 MED ORDER — PHENYLEPHRINE HCL-NACL 20-0.9 MG/250ML-% IV SOLN
INTRAVENOUS | Status: DC | PRN
  Administered 2019-09-20: 20 ug/min via INTRAVENOUS

## 2019-09-20 MED ORDER — ORAL CARE MOUTH RINSE: 15.0000 mL | Freq: Once | OROMUCOSAL | Status: AC

## 2019-09-20 MED ORDER — LACTATED RINGERS IV SOLN: INTRAVENOUS | Status: DC

## 2019-09-20 MED ORDER — CEFAZOLIN SODIUM-DEXTROSE 2-4 GM/100ML-% IV SOLN
2.0000 g | INTRAVENOUS | Status: AC
  Administered 2019-09-20: 2 g via INTRAVENOUS
  Filled 2019-09-20: qty 100

## 2019-09-20 MED ORDER — MIDAZOLAM HCL 2 MG/2ML IJ SOLN
1.0000 mg | INTRAMUSCULAR | Status: DC
  Administered 2019-09-20: 2 mg via INTRAVENOUS
  Filled 2019-09-20: qty 2

## 2019-09-20 MED ORDER — BUPIVACAINE-EPINEPHRINE (PF) 0.25% -1:200000 IJ SOLN
INTRAMUSCULAR | Status: AC
  Filled 2019-09-20: qty 30

## 2019-09-20 MED ORDER — BUPIVACAINE HCL (PF) 0.5 % IJ SOLN
INTRAMUSCULAR | Status: DC | PRN
  Administered 2019-09-20: 15 mL via PERINEURAL

## 2019-09-20 MED ORDER — TRANEXAMIC ACID-NACL 1000-0.7 MG/100ML-% IV SOLN
INTRAVENOUS | Status: DC | PRN
  Administered 2019-09-20: 1000 mg via INTRAVENOUS

## 2019-09-20 MED ORDER — BUPIVACAINE-EPINEPHRINE (PF) 0.25% -1:200000 IJ SOLN
INTRAMUSCULAR | Status: DC | PRN
  Administered 2019-09-20: 13 mL

## 2019-09-20 MED ORDER — TRANEXAMIC ACID-NACL 1000-0.7 MG/100ML-% IV SOLN
INTRAVENOUS | Status: AC
  Filled 2019-09-20: qty 100

## 2019-09-20 MED ORDER — CELECOXIB 200 MG PO CAPS
200.0000 mg | ORAL_CAPSULE | Freq: Once | ORAL | Status: AC
  Administered 2019-09-20: 200 mg via ORAL
  Filled 2019-09-20: qty 1

## 2019-09-20 MED ORDER — FENTANYL CITRATE (PF) 100 MCG/2ML IJ SOLN
INTRAMUSCULAR | Status: DC | PRN
Start: 2019-09-20 — End: 2019-09-20
  Administered 2019-09-20: 100 ug via INTRAVENOUS
  Administered 2019-09-20 (×3): 50 ug via INTRAVENOUS

## 2019-09-20 MED ORDER — METHOCARBAMOL 500 MG PO TABS: 500.0000 mg | ORAL_TABLET | Freq: Four times a day (QID) | ORAL | 1 refills | Status: DC | PRN

## 2019-09-20 MED ORDER — DEXAMETHASONE SODIUM PHOSPHATE 10 MG/ML IJ SOLN
INTRAMUSCULAR | Status: DC | PRN
  Administered 2019-09-20: 10 mg via INTRAVENOUS

## 2019-09-20 MED ORDER — PHENYLEPHRINE HCL (PRESSORS) 10 MG/ML IV SOLN
INTRAVENOUS | Status: AC
  Filled 2019-09-20: qty 2

## 2019-09-20 MED ORDER — CHLORHEXIDINE GLUCONATE 0.12 % MT SOLN
15.0000 mL | Freq: Once | OROMUCOSAL | Status: AC
  Administered 2019-09-20: 15 mL via OROMUCOSAL

## 2019-09-20 MED ORDER — PROPOFOL 10 MG/ML IV BOLUS
INTRAVENOUS | Status: DC | PRN
  Administered 2019-09-20: 180 mg via INTRAVENOUS

## 2019-09-20 MED ORDER — EPHEDRINE SULFATE-NACL 50-0.9 MG/10ML-% IV SOSY
PREFILLED_SYRINGE | INTRAVENOUS | Status: DC | PRN
  Administered 2019-09-20 (×3): 10 mg via INTRAVENOUS

## 2019-09-20 MED ORDER — ONDANSETRON HCL 4 MG PO TABS: 4.0000 mg | ORAL_TABLET | Freq: Three times a day (TID) | ORAL | 1 refills | Status: DC | PRN

## 2019-09-20 MED ORDER — ONDANSETRON HCL 4 MG/2ML IJ SOLN
INTRAMUSCULAR | Status: DC | PRN
  Administered 2019-09-20: 4 mg via INTRAVENOUS

## 2019-09-20 MED ORDER — SUGAMMADEX SODIUM 200 MG/2ML IV SOLN
INTRAVENOUS | Status: DC | PRN
  Administered 2019-09-20: 200 mg via INTRAVENOUS

## 2019-09-20 MED ORDER — BUPIVACAINE LIPOSOME 1.3 % IJ SUSP
INTRAMUSCULAR | Status: DC | PRN
  Administered 2019-09-20: 10 mL

## 2019-09-20 MED ORDER — HYDROCODONE-ACETAMINOPHEN 5-325 MG PO TABS: 1.0000 | ORAL_TABLET | ORAL | 0 refills | Status: DC | PRN

## 2019-09-20 MED ORDER — EPHEDRINE 5 MG/ML INJ
INTRAVENOUS | Status: AC
  Filled 2019-09-20: qty 10

## 2019-09-20 MED ORDER — SUCCINYLCHOLINE CHLORIDE 20 MG/ML IJ SOLN
INTRAMUSCULAR | Status: DC | PRN
  Administered 2019-09-20: 120 mg via INTRAVENOUS

## 2019-09-20 MED ORDER — TRANEXAMIC ACID-NACL 1000-0.7 MG/100ML-% IV SOLN: 1000.0000 mg | INTRAVENOUS | Status: DC

## 2019-09-20 MED ORDER — PROMETHAZINE HCL 25 MG/ML IJ SOLN: 6.2500 mg | INTRAMUSCULAR | Status: DC | PRN

## 2019-09-20 MED ORDER — FENTANYL CITRATE (PF) 100 MCG/2ML IJ SOLN
50.0000 ug | Freq: Once | INTRAMUSCULAR | Status: AC
  Administered 2019-09-20: 100 ug via INTRAVENOUS
  Filled 2019-09-20: qty 2

## 2019-09-20 MED ORDER — 0.9 % SODIUM CHLORIDE (POUR BTL) OPTIME
TOPICAL | Status: DC | PRN
  Administered 2019-09-20: 1000 mL

## 2019-09-20 MED ORDER — LIDOCAINE 2% (20 MG/ML) 5 ML SYRINGE
INTRAMUSCULAR | Status: DC | PRN
  Administered 2019-09-20: 60 mg via INTRAVENOUS

## 2019-09-20 MED ORDER — PROPOFOL 10 MG/ML IV BOLUS
INTRAVENOUS | Status: AC
  Filled 2019-09-20: qty 20

## 2019-09-20 MED ORDER — PHENYLEPHRINE 40 MCG/ML (10ML) SYRINGE FOR IV PUSH (FOR BLOOD PRESSURE SUPPORT)
PREFILLED_SYRINGE | INTRAVENOUS | Status: AC
  Filled 2019-09-20: qty 10

## 2019-09-20 MED ORDER — ACETAMINOPHEN 500 MG PO TABS
1000.0000 mg | ORAL_TABLET | Freq: Once | ORAL | Status: AC
  Administered 2019-09-20: 1000 mg via ORAL
  Filled 2019-09-20: qty 2

## 2019-09-20 MED ORDER — HYDROMORPHONE HCL 1 MG/ML IJ SOLN: 0.2500 mg | INTRAMUSCULAR | Status: DC | PRN

## 2019-09-20 SURGICAL SUPPLY — 69 items
BAG ZIPLOCK 12X15 (MISCELLANEOUS) IMPLANT
BIT DRILL 1.6MX128 (BIT) IMPLANT
BIT DRILL 170X2.5X (BIT) ×1 IMPLANT
BIT DRL 170X2.5X (BIT) ×1
BLADE SAG 18X100X1.27 (BLADE) ×2 IMPLANT
CLSR STERI-STRIP ANTIMIC 1/2X4 (GAUZE/BANDAGES/DRESSINGS) ×2 IMPLANT
COVER BACK TABLE 60X90IN (DRAPES) ×2 IMPLANT
COVER SURGICAL LIGHT HANDLE (MISCELLANEOUS) ×2 IMPLANT
COVER WAND RF STERILE (DRAPES) IMPLANT
DECANTER SPIKE VIAL GLASS SM (MISCELLANEOUS) ×2 IMPLANT
DRAPE INCISE IOBAN 66X45 STRL (DRAPES) ×2 IMPLANT
DRAPE ORTHO SPLIT 77X108 STRL (DRAPES) ×4
DRAPE SHEET LG 3/4 BI-LAMINATE (DRAPES) ×2 IMPLANT
DRAPE SURG ORHT 6 SPLT 77X108 (DRAPES) ×2 IMPLANT
DRAPE TOP 10253 STERILE (DRAPES) ×2 IMPLANT
DRAPE U-SHAPE 47X51 STRL (DRAPES) ×2 IMPLANT
DRILL 2.5 (BIT) ×2
DRSG ADAPTIC 3X8 NADH LF (GAUZE/BANDAGES/DRESSINGS) ×2 IMPLANT
DRSG PAD ABDOMINAL 8X10 ST (GAUZE/BANDAGES/DRESSINGS) ×2 IMPLANT
DURAPREP 26ML APPLICATOR (WOUND CARE) ×2 IMPLANT
ELECT BLADE TIP CTD 4 INCH (ELECTRODE) ×2 IMPLANT
ELECT NEEDLE TIP 2.8 STRL (NEEDLE) ×2 IMPLANT
ELECT REM PT RETURN 15FT ADLT (MISCELLANEOUS) ×2 IMPLANT
EPI LT SZ 1 (Orthopedic Implant) ×2 IMPLANT
EPIPHYSIS LT SZ 1 (Orthopedic Implant) ×1 IMPLANT
GAUZE SPONGE 4X4 12PLY STRL (GAUZE/BANDAGES/DRESSINGS) ×2 IMPLANT
GLENOSPHERE DELTA XTEND LAT 38 (Miscellaneous) ×2 IMPLANT
GLOVE BIOGEL PI ORTHO PRO 7.5 (GLOVE) ×1
GLOVE BIOGEL PI ORTHO PRO SZ8 (GLOVE) ×1
GLOVE ORTHO TXT STRL SZ7.5 (GLOVE) ×2 IMPLANT
GLOVE PI ORTHO PRO STRL 7.5 (GLOVE) ×1 IMPLANT
GLOVE PI ORTHO PRO STRL SZ8 (GLOVE) ×1 IMPLANT
GLOVE SURG ORTHO 8.5 STRL (GLOVE) ×2 IMPLANT
GOWN STRL REUS W/TWL XL LVL3 (GOWN DISPOSABLE) ×4 IMPLANT
KIT BASIN OR (CUSTOM PROCEDURE TRAY) ×2 IMPLANT
KIT TURNOVER KIT A (KITS) IMPLANT
MANIFOLD NEPTUNE II (INSTRUMENTS) ×2 IMPLANT
METAGLENE DELTA EXTEND (Trauma) ×1 IMPLANT
METAGLENE DXTEND (Trauma) ×2 IMPLANT
NEEDLE MAYO CATGUT SZ4 (NEEDLE) ×2 IMPLANT
NS IRRIG 1000ML POUR BTL (IV SOLUTION) ×2 IMPLANT
PACK SHOULDER (CUSTOM PROCEDURE TRAY) ×2 IMPLANT
PENCIL SMOKE EVACUATOR (MISCELLANEOUS) IMPLANT
PIN GUIDE 1.2 (PIN) ×2 IMPLANT
PIN GUIDE GLENOPHERE 1.5MX300M (PIN) ×2 IMPLANT
PIN METAGLENE 2.5 (PIN) ×4 IMPLANT
PROTECTOR NERVE ULNAR (MISCELLANEOUS) ×2 IMPLANT
RESTRAINT HEAD UNIVERSAL NS (MISCELLANEOUS) ×2 IMPLANT
SCREW 4.5X18MM (Screw) ×4 IMPLANT
SCREW BN 18X4.5XSTRL SHLDR (Screw) ×2 IMPLANT
SCREW LOCK 42 (Screw) ×2 IMPLANT
SCREW LOCK DELTA XTEND 4.5X30 (Screw) ×2 IMPLANT
SLING ARM FOAM STRAP LRG (SOFTGOODS) ×2 IMPLANT
SMARTMIX MINI TOWER (MISCELLANEOUS)
SPACER 38 PLUS 3 (Spacer) ×2 IMPLANT
SPONGE LAP 4X18 RFD (DISPOSABLE) IMPLANT
STEM DELTA DIA 10 HA (Stem) ×2 IMPLANT
STRIP CLOSURE SKIN 1/2X4 (GAUZE/BANDAGES/DRESSINGS) ×2 IMPLANT
SUCTION FRAZIER HANDLE 10FR (MISCELLANEOUS) ×2
SUCTION TUBE FRAZIER 10FR DISP (MISCELLANEOUS) ×1 IMPLANT
SUT FIBERWIRE #2 38 T-5 BLUE (SUTURE) ×10
SUT MNCRL AB 4-0 PS2 18 (SUTURE) ×2 IMPLANT
SUT VIC AB 0 CT1 36 (SUTURE) ×4 IMPLANT
SUT VIC AB 0 CT2 27 (SUTURE) ×2 IMPLANT
SUT VIC AB 2-0 CT1 27 (SUTURE) ×2
SUT VIC AB 2-0 CT1 TAPERPNT 27 (SUTURE) ×1 IMPLANT
SUTURE FIBERWR #2 38 T-5 BLUE (SUTURE) ×5 IMPLANT
TOWEL OR 17X26 10 PK STRL BLUE (TOWEL DISPOSABLE) ×2 IMPLANT
TOWER SMARTMIX MINI (MISCELLANEOUS) IMPLANT

## 2019-09-20 NOTE — Anesthesia Postprocedure Evaluation (Signed)
Anesthesia Post Note  Patient: Linda Allen  Procedure(s) Performed: REVERSE SHOULDER ARTHROPLASTY (Left Shoulder)     Patient location during evaluation: PACU Anesthesia Type: General Level of consciousness: sedated Pain management: pain level controlled Vital Signs Assessment: post-procedure vital signs reviewed and stable Respiratory status: spontaneous breathing and respiratory function stable Cardiovascular status: stable Postop Assessment: no apparent nausea or vomiting Anesthetic complications: no   No complications documented.  Last Vitals:  Vitals:   09/20/19 1400 09/20/19 1415  BP: 139/78 131/73  Pulse: 64 68  Resp: 13 13  Temp: (!) 36.2 C (!) 36.4 C  SpO2: 92% 92%    Last Pain:  Vitals:   09/20/19 1415  TempSrc:   PainSc: Asleep                 SINGER,JAMES DANIEL

## 2019-09-20 NOTE — Transfer of Care (Signed)
Immediate Anesthesia Transfer of Care Note  Patient: Linda Allen  Procedure(s) Performed: REVERSE SHOULDER ARTHROPLASTY (Left Shoulder)  Patient Location: PACU  Anesthesia Type:General  Level of Consciousness: sedated, patient cooperative and responds to stimulation  Airway & Oxygen Therapy: Patient Spontanous Breathing and Patient connected to face mask oxygen  Post-op Assessment: Report given to RN and Post -op Vital signs reviewed and stable  Post vital signs: Reviewed and stable  Last Vitals:  Vitals Value Taken Time  BP 166/84 09/20/19 1248  Temp    Pulse 71 09/20/19 1250  Resp 12 09/20/19 1250  SpO2 100 % 09/20/19 1250  Vitals shown include unvalidated device data.  Last Pain:  Vitals:   09/20/19 0800  TempSrc: Oral         Complications: No complications documented.

## 2019-09-20 NOTE — Progress Notes (Signed)
Assisted Dr. Singer with left, ultrasound guided, interscalene  block. Side rails up, monitors on throughout procedure. See vital signs in flow sheet. Tolerated Procedure well.  

## 2019-09-20 NOTE — Discharge Instructions (Signed)
Ice to the shoulder constantly.  Keep the incision covered and clean and dry for one week, then ok to get it wet in the shower. ° °Do exercise as instructed several times per day. ° °DO NOT reach behind your back or push up out of a chair with the operative arm. ° °Use a sling while you are up and around for comfort, may remove while seated.  Keep pillow propped behind the operative elbow. ° °Follow up with Dr Cerria Randhawa in two weeks in the office, call [phone] for appt °

## 2019-09-20 NOTE — Anesthesia Procedure Notes (Signed)
Anesthesia Regional Block: Interscalene brachial plexus block   Pre-Anesthetic Checklist: ,, timeout performed, Correct Patient, Correct Site, Correct Laterality, Correct Procedure, Correct Position, site marked, Risks and benefits discussed,  Surgical consent,  Pre-op evaluation,  At surgeon's request and post-op pain management  Laterality: Left  Prep: chloraprep       Needles:  Injection technique: Single-shot  Needle Type: Echogenic Stimulator Needle     Needle Length: 5cm  Needle Gauge: 22     Additional Needles:   Narrative:  Start time: 09/20/2019 8:33 AM End time: 09/20/2019 8:43 AM Injection made incrementally with aspirations every 5 mL.  Performed by: Personally  Anesthesiologist: Duane Boston, MD  Additional Notes: Functioning IV was confirmed and monitors applied.  A 11mm 22ga echogenic arrow stimulator was used. Sterile prep and drape,hand hygiene and sterile gloves were used.Ultrasound guidance: relevant anatomy identified, needle position confirmed, local anesthetic spread visualized around nerve(s)., vascular puncture avoided.  Image printed for medical record.  Negative aspiration and negative test dose prior to incremental administration of local anesthetic. The patient tolerated the procedure well.

## 2019-09-20 NOTE — Progress Notes (Signed)
Pt discharged in NAD, VSS, pain tolerable. Pt and husband given discharge instructions and exercises from OT. Pt given DME 3:1 commode per OT request. Pt discharged home with husband.

## 2019-09-20 NOTE — Brief Op Note (Signed)
09/20/2019  12:46 PM  PATIENT:  Linda Allen  74 y.o. female  PRE-OPERATIVE DIAGNOSIS:  Left shoulder rotator cuff insufficiency, severe OA  POST-OPERATIVE DIAGNOSIS:  Left shoulder rotator cuff insufficiency, severe OA  PROCEDURE:  Procedure(s): REVERSE SHOULDER ARTHROPLASTY (Left) DePuy Delta Xtend with subscap repair  SURGEON:  Surgeon(s) and Role:    Netta Cedars, MD - Primary  PHYSICIAN ASSISTANT:   ASSISTANTS: Ventura Bruns, PA-C    ANESTHESIA:   regional and general  EBL:  300 mL   BLOOD ADMINISTERED:none  DRAINS: none   LOCAL MEDICATIONS USED:  MARCAINE     SPECIMEN:  No Specimen  DISPOSITION OF SPECIMEN:  N/A  COUNTS:  YES  TOURNIQUET:  * No tourniquets in log *  DICTATION: .Other Dictation: Dictation Number [id number]  PLAN OF CARE: Discharge to home after PACU  PATIENT DISPOSITION:  PACU - hemodynamically stable.   Delay start of Pharmacological VTE agent (>24hrs) due to surgical blood loss or risk of bleeding: not applicable

## 2019-09-20 NOTE — Anesthesia Procedure Notes (Signed)
Procedure Name: Intubation Performed by: Gean Maidens, CRNA Pre-anesthesia Checklist: Patient identified, Emergency Drugs available, Suction available, Patient being monitored and Timeout performed Patient Re-evaluated:Patient Re-evaluated prior to induction Oxygen Delivery Method: Circle system utilized Preoxygenation: Pre-oxygenation with 100% oxygen Induction Type: IV induction Ventilation: Mask ventilation without difficulty Laryngoscope Size: Mac and 3 Grade View: Grade II Tube type: Oral Tube size: 7.0 mm Number of attempts: 1 Airway Equipment and Method: Stylet Placement Confirmation: ETT inserted through vocal cords under direct vision,  positive ETCO2 and breath sounds checked- equal and bilateral Secured at: 21 cm Tube secured with: Tape Dental Injury: Teeth and Oropharynx as per pre-operative assessment

## 2019-09-20 NOTE — Evaluation (Addendum)
Occupational Therapy Evaluation Patient Details Name: Linda Allen MRN: [id number] DOB: 05-Oct-1945 Today's Date: 09/20/2019    History of Present Illness 74 yo female s/p reverse total shoulder. PMH including arthritis, heart murmur, HTN, and scleroderma.     Clinical Impression   PTA, pt was living with her husband and was independent. Currently, pt requires Min-Max for UB ADLs, Min A for LB ADLs, and single hand held for mobility. Provided education and handout on shoulder precautions, exercises, UB ADLs, LB ADLs, toileting, and shower transfer with 3n1; pt demonstrated understanding. Husband demonstrating understanding of assist for UB ADLs and sling management. Answered all pt questions. Recommend dc home once medically stable per physician. All acute OT needs met and will sign off. Thank you.    Follow Up Recommendations  Follow surgeon's recommendation for DC plan and follow-up therapies    Equipment Recommendations  3 in 1 bedside commode    Recommendations for Other Services       Precautions / Restrictions Precautions Precautions: Shoulder Type of Shoulder Precautions: Provided handout and reviewed in full Shoulder Interventions: Shoulder sling/immobilizer;Off for dressing/bathing/exercises Precaution Booklet Issued: Yes (comment) Precaution Comments: Conservative protocal with no ROM of shoulder.  Restrictions Weight Bearing Restrictions: Yes LUE Weight Bearing: Non weight bearing Other Position/Activity Restrictions: conservative shoulder precautions      Mobility Bed Mobility               General bed mobility comments: In recliner  Transfers Overall transfer level: Needs assistance   Transfers: Sit to/from Stand Sit to Stand: Supervision         General transfer comment: Supervision for safety    Balance Overall balance assessment: Needs assistance Sitting-balance support: No upper extremity supported;Feet supported Sitting balance-Leahy  Scale: Good     Standing balance support: Single extremity supported;During functional activity Standing balance-Leahy Scale: Fair                             ADL either performed or assessed with clinical judgement   ADL Overall ADL's : Needs assistance/impaired Eating/Feeding: Set up;Sitting   Grooming: Supervision/safety;Wash/dry hands;Standing   Upper Body Bathing: Minimal assistance;Sitting Upper Body Bathing Details (indicate cue type and reason): Eduacting pt on comepnsatory techniques Lower Body Bathing: Min guard;Sit to/from stand   Upper Body Dressing : Maximal assistance;Sitting;With caregiver independent assisting Upper Body Dressing Details (indicate cue type and reason): Eduacting pt on comepnsatory techniques; husband assisting to don shirt and sling Lower Body Dressing: Minimal assistance;Sit to/from stand Lower Body Dressing Details (indicate cue type and reason): Min A to don pants Toilet Transfer: Minimal assistance;Ambulation;Regular Toilet;Grab bars   Toileting- Clothing Manipulation and Hygiene: Minimal assistance;Sit to/from stand;Sitting/lateral lean Toileting - Clothing Manipulation Details (indicate cue type and reason): Min A to manage pants     Functional mobility during ADLs: Minimal assistance;Cane;Min guard (single hand held) General ADL Comments: Providing education on shoulder precautions, exercises, sling management, sleep positioning, UB ADLs, LB ADLs, and toileting     Vision         Perception     Praxis      Pertinent Vitals/Pain Pain Assessment: No/denies pain     Hand Dominance Right   Extremity/Trunk Assessment Upper Extremity Assessment Upper Extremity Assessment: LUE deficits/detail LUE Deficits / Details: s/p reverse shoulder. Ableto wiggle fingers. Nerve block still in place LUE Coordination: decreased gross motor   Lower Extremity Assessment Lower Extremity Assessment: Overall WFL for tasks assessed  Cervical / Trunk Assessment Cervical / Trunk Assessment: Other exceptions Cervical / Trunk Exceptions: Increased body habitus   Communication Communication Communication: No difficulties   Cognition Arousal/Alertness: Awake/alert Behavior During Therapy: WFL for tasks assessed/performed Overall Cognitive Status: Within Functional Limits for tasks assessed                                     General Comments  Husband present    Exercises Exercises: Shoulder   Shoulder Instructions Shoulder Instructions Donning/doffing shirt without moving shoulder: Caregiver independent with task Method for sponge bathing under operated UE: Minimal assistance Donning/doffing sling/immobilizer: Caregiver independent with task Correct positioning of sling/immobilizer: Caregiver independent with task ROM for elbow, wrist and digits of operated UE:  (educating but pt unable due to nerve block) Sling wearing schedule (on at all times/off for ADL's): Caregiver independent with task;Patient able to independently direct caregiver Proper positioning of operated UE when showering: Patient able to independently direct caregiver Positioning of UE while sleeping: Caregiver independent with task    Home Living Family/patient expects to be discharged to:: Private residence Living Arrangements: Spouse/significant other Available Help at Discharge: Family Type of Home: House Home Access: Stairs to enter Technical brewer of Steps: 1+1 Entrance Stairs-Rails: None Home Layout: Two level;Bed/bath upstairs;Full bath on main level Alternate Level Stairs-Number of Steps: 14 Alternate Level Stairs-Rails: Left Bathroom Shower/Tub: Walk-in shower;Tub/shower unit   Bathroom Toilet: Handicapped height Bathroom Accessibility: Yes   Home Equipment: Walker - 2 wheels;Wheelchair - manual;Cane - single point;Grab bars - tub/shower;Bedside commode          Prior Functioning/Environment Level of  Independence: Independent with assistive device(s)                 OT Problem List: Decreased strength;Impaired balance (sitting and/or standing);Decreased knowledge of precautions;Decreased knowledge of use of DME or AE;Impaired UE functional use      OT Treatment/Interventions:      OT Goals(Current goals can be found in the care plan section) Acute Rehab OT Goals Patient Stated Goal: go home OT Goal Formulation: All assessment and education complete, DC therapy  OT Frequency:     Barriers to D/C:            Co-evaluation              AM-PAC OT "6 Clicks" Daily Activity     Outcome Measure Help from another person eating meals?: A Little Help from another person taking care of personal grooming?: A Little Help from another person toileting, which includes using toliet, bedpan, or urinal?: A Little Help from another person bathing (including washing, rinsing, drying)?: A Little Help from another person to put on and taking off regular upper body clothing?: A Little Help from another person to put on and taking off regular lower body clothing?: A Little 6 Click Score: 18   End of Session Equipment Utilized During Treatment: Other (comment) (sling) Nurse Communication: Mobility status  Activity Tolerance: Patient tolerated treatment well Patient left: in chair;with call bell/phone within reach;with family/visitor present  OT Visit Diagnosis: Unsteadiness on feet (R26.81);Muscle weakness (generalized) (M62.81)                Time: 6808-8110 OT Time Calculation (min): 40 min Charges:  OT General Charges $OT Visit: 1 Visit OT Evaluation $OT Eval Low Complexity: 1 Low OT Treatments $Self Care/Home Management : 23-37 mins  Trenity Pha MSOT, OTR/L  Acute Rehab Pager: [phone] Office: [place] 09/20/2019, 5:44 PM

## 2019-09-20 NOTE — Interval H&P Note (Signed)
History and Physical Interval Note:  09/20/2019 9:28 AM  Linda Allen  has presented today for surgery, with the diagnosis of Left shoulder rotator cuff insufficiency.  The various methods of treatment have been discussed with the patient and family. After consideration of risks, benefits and other options for treatment, the patient has consented to  Procedure(s): REVERSE SHOULDER ARTHROPLASTY (Left) as a surgical intervention.  The patient's history has been reviewed, patient examined, no change in status, stable for surgery.  I have reviewed the patient's chart and labs.  Questions were answered to the patient's satisfaction.     [name]

## 2019-09-20 NOTE — Op Note (Signed)
NAME: Linda Allen, Linda A. MEDICAL RECORD UR:[id number] ACCOUNT [id number] DATE OF BIRTH:1945-09-28 FACILITY: [facility] LOCATION: [facility] PHYSICIAN:STEVEN Orlena Sheldon, MD  OPERATIVE REPORT  DATE OF PROCEDURE:  09/20/2019  PREOPERATIVE DIAGNOSIS:  Left shoulder end-stage osteoarthritis with rotator cuff insufficiency.  POSTOPERATIVE DIAGNOSIS:  Left shoulder end-stage osteoarthritis with rotator cuff insufficiency.  PROCEDURE PERFORMED:  Left reverse total shoulder replacement using DePuy Delta Xtend prosthesis.  ATTENDING SURGEON:  Esmond Plants, MD  ASSISTANT:  Darol Destine, [place], who was scrubbed during the entire procedure and necessary for satisfactory completion of surgery.  ANESTHESIA:  General anesthesia was used, plus interscalene block.  ESTIMATED BLOOD LOSS:  300 mL.  FLUID REPLACEMENT:  1500 mL crystalloid.  INSTRUMENT COUNTS:  Correct.  COMPLICATIONS:  No complications.  ANTIBIOTICS:  Perioperative antibiotics were given.  INDICATIONS:  The patient is a 74 year old female who presents with a history of worsening left shoulder pain and dysfunction secondary to end-stage arthritis and rotator cuff insufficiency.  The patient has had an extended period of conservative  management and presents now for operative treatment, having failed conservative management and desiring a total shoulder arthroplasty.  Risks including but not limited to infection and instability were discussed in detail with the patient.  Informed  consent was obtained.  DESCRIPTION OF PROCEDURE:  After an adequate level of anesthesia was achieved, the patient was positioned in modified beach chair position.  Left shoulder correctly identified and sterilely prepped and draped in the usual manner.  Time-out called,  verifying correct patient and correct site.  We entered the patient's shoulder using a deltopectoral incision, starting at the coracoid process, extending down to the anterior humerus,  dissection down through subcutaneous tissues using the Bovie  electrocautery.  I identified cephalic vein, took the deltoid laterally and the cephalic vein and the deltoid laterally and the pectoralis medially.  We identified conjoined tendon, retracted that medially, placed our deep retractors.  We tenodesed the  biceps in situ with 0 Vicryl figure-of-eight suture, followed by release of the subscapularis and tagging with #2 FiberWire suture in a modified Mason-Allen suture technique to repair the tendon.  We released the subscapularis in a peel-type  subperiosteal way off the lesser tuberosity.  We also released the inferior capsule, progressively externally rotating.  We then delivered the humeral head out of the wound, entering the proximal humerus with a 6 mm reamer, reaming up to a size 10.  We  then placed our 10 mm intramedullary resection guide and resected the head at 10 degrees of retroversion with the oscillating saw.  We irrigated thoroughly, removed excess bone spurs off the humeral side with a rongeur.  We next subluxed the humerus  posteriorly, gaining good exposure of the glenoid face.  There was a large osteophyte spanning the entire posterior glenoid, which was removed using a combination of Soil scientist, Dispensing optician and the Bovie.  Once we had that out and a couple of loose  bodies, we had good look at the glenoid.  There was extensive spurring superiorly as well and anteriorly.  We took the high side down with a rongeur.  We then placed our central guide pin and reamed for the metaglene baseplate.  We drilled out our  central peg hole impacted the metaglene into position, centered low on the glenoid face and angled slightly inferiorly.  Once that was impacted in place, we placed a 42 screw inferiorly, a 30 at the base of the coracoid and 18 nonlocked anteriorly and  posteriorly.  We  locked the superior and inferior screw.  Baseplate security was excellent.  We selected a 38 standard  glenosphere and attached that to the baseplate using a combination impactor and central screw.  Once we had that set screw tightened, I   did a finger sweep to make sure that the glenosphere was totally seated and there were no soft tissue attachments or soft tissue being caught up in the bearing.  We then directed our attention back toward the humeral side.  We completed our humeral  preparation with the reamer for the 1 left metaphysis.  We then placed a 10 stem with a 1 left metaphysis in 10 degrees of retroversion and that was set on the 0 setting.  Once we had that impacted in place, we selected a 38+3 poly and placed that on the  humeral tray and then reduced the shoulder.  We had good soft tissue tension with the conjoined appropriately tensioned, no gapping with inferior pole or external rotation.  We removed the trial components, irrigated thoroughly, drilled holes in the  lesser tuberosity and placed #2 FiberWire suture for repair of the subscap.  We then used available bone graft from the humeral head in impaction grafting technique with the HA coated press-fit 10 stem and 1 left metaphysis set on the 0 setting and we  impacted that in 10 degrees of retroversion.  Stem security was outstanding.  We selected the real 38+3 poly and placed it on the humeral tray, impacting that and then reducing the shoulder, again a little pop as it reduced and good tension on the  conjoined and no gapping with external rotation or pull inferiorly.  We irrigated again thoroughly.  Repaired the subscapularis anatomically back to the lesser tuberosity with sutures through bone.  We had a nice repair, did not compromise range of  motion at all.  We then irrigated again and repaired the deltopectoral interval with 0 Vicryl suture, followed by 2-0 Vicryl for subcutaneous closure and 4-0 Monocryl for skin and Steri-Strips applied, followed by sterile dressing.  The patient was  awakened and taken to recovery room in  stable condition.  VN/NUANCE  D:09/20/2019 T:09/20/2019 JOB:[id number]

## 2019-09-23 ENCOUNTER — Encounter (HOSPITAL_COMMUNITY): Payer: Self-pay | Admitting: Orthopedic Surgery

## 2019-10-02 NOTE — Progress Notes (Signed)
Cardiology Office Note:   Date:  10/03/2019  NAME:  Linda Allen    MRN: [id number] DOB:  1945/12/17   PCP:  Minette Brine, FNP  Cardiologist:  Evalina Field, MD   Referring MD: Carol Ada*   Chief Complaint  Patient presents with  . Aortic Stenosis   History of Present Illness:   Linda Allen is a 74 y.o. female with a hx of moderate aortic stenosis, hyperlipidemia who presents for follow-up of aortic stenosis.  She had her successful left hip surgery and also had left shoulder surgery.  She is recovering well.  She had no major issues with surgery.  We did go over the results of her echocardiogram which demonstrated moderate aortic stenosis.  This is not likely to cause her any symptoms that she has known.  She is not exercising but needs to start.  Her blood pressure today is well controlled.  She denies any chest pain, shortness of breath or palpitations.  We did go over the results of her lipid profile from 2018 which showed very high HDL cholesterol.  Overall appears to be stable without limitations.  Problem List 1. HLD -T chol 245, HDL 112, LDL 122, TG 55 -A1c 5.6 2. Moderate AS -Vmax 3.2 m/s, MG 25.7 mmHG, AVA 1.08 cm2  Past Medical History: Past Medical History:  Diagnosis Date  . Allergy   . Arthritis    L hip  . Heart murmur   . Hypertension   . Scleroderma [facility])     Past Surgical History: Past Surgical History:  Procedure Laterality Date  . ENDOVENOUS ABLATION SAPHENOUS VEIN W/ LASER Left 09/26/2018   endovenous laser ablation left greater saphenous vein by Ruta Hinds MD   . ENDOVENOUS ABLATION SAPHENOUS VEIN W/ LASER Right 10/17/2018   endovenous laser ablation right greater saphenous vein by Ruta Hinds MD   . JOINT REPLACEMENT  2011, 2012   both knees  . REVERSE SHOULDER ARTHROPLASTY Left 09/20/2019   Procedure: REVERSE SHOULDER ARTHROPLASTY;  Surgeon: Netta Cedars, MD;  Location: [facility];  Service: Orthopedics;   Laterality: Left;  . SHOULDER ACROMIOPLASTY Right 2012  . TOTAL HIP ARTHROPLASTY Left 07/09/2019   Procedure: TOTAL HIP ARTHROPLASTY ANTERIOR APPROACH;  Surgeon: Paralee Cancel, MD;  Location: [facility];  Service: Orthopedics;  Laterality: Left;  70 mins    Current Medications: No outpatient medications have been marked as taking for the 10/03/19 encounter (Office Visit) with Audie Box, Cassie Freer, MD.     Allergies:    Bee venom, Ivp dye [iodinated diagnostic agents], and Penicillins   Social History: Social History   Socioeconomic History  . Marital status: Married    Spouse name: Not on file  . Number of children: 2  . Years of education: Not on file  . Highest education level: Not on file  Occupational History  . Occupation: retired  Tobacco Use  . Smoking status: Former Smoker    Years: 10.00  . Smokeless tobacco: Never Used  . Tobacco comment: smoked for 10 yeas, d/c 40 y ago  Vaping Use  . Vaping Use: Never used  Substance and Sexual Activity  . Alcohol use: Yes    Comment: occas.  . Drug use: Never  . Sexual activity: Yes    Birth control/protection: None    Comment: number of sex partners in the last 16 months  1  Other Topics Concern  . Not on file  Social History Narrative   Exercise walking 3 times per  week for 30 minutes.      Anticipatory guidance - has living will, full code, but would not want to be on ventilator for prolonged time. organ donor.             Social Determinants of Health   Financial Resource Strain: Low Risk   . Difficulty of Paying Living Expenses: Not hard at all  Food Insecurity: No Food Insecurity  . Worried About Charity fundraiser in the Last Year: Never true  . Ran Out of Food in the Last Year: Never true  Transportation Needs: No Transportation Needs  . Lack of Transportation (Medical): No  . Lack of Transportation (Non-Medical): No  Physical Activity: Inactive  . Days of Exercise per Week: 0 days  . Minutes of Exercise  per Session: 0 min  Stress: No Stress Concern Present  . Feeling of Stress : Only a little  Social Connections:   . Frequency of Communication with Friends and Family: Not on file  . Frequency of Social Gatherings with Friends and Family: Not on file  . Attends Religious Services: Not on file  . Active Member of Clubs or Organizations: Not on file  . Attends Archivist Meetings: Not on file  . Marital Status: Not on file     Family History: The patient's family history includes CVA in her father and mother; Eczema in her sister; Heart attack in her father; Heart failure in her paternal grandmother; Parkinson's disease in her father; Ulcerative colitis in her brother.  ROS:   All other ROS reviewed and negative. Pertinent positives noted in the HPI.     EKGs/Labs/Other Studies Reviewed:   The following studies were personally reviewed by me today:  EKG:  EKG is ordered today.  The ekg ordered today demonstrates normal sinus rhythm, heart rate 80, no acute ischemic changes, no evidence for infarction, and was personally reviewed by me.   TTE 07/03/2019 1. Left ventricular ejection fraction, by estimation, is 60 to 65%. The  left ventricle has normal function. The left ventricle has no regional  wall motion abnormalities. There is mild concentric left ventricular  hypertrophy. Left ventricular diastolic  parameters are consistent with Grade II diastolic dysfunction  (pseudonormalization). The average left ventricular global longitudinal  strain is -24.5 %. The global longitudinal strain is normal.  2. Right ventricular systolic function is normal. The right ventricular  size is normal. Tricuspid regurgitation signal is inadequate for assessing  PA pressure.  3. Left atrial size was mildly dilated.  4. The mitral valve is degenerative. No evidence of mitral valve  regurgitation. No evidence of mitral stenosis.  5. The aortic valve is tricuspid. Aortic valve  regurgitation is not  visualized. Moderate aortic valve stenosis. Aortic valve area, by VTI  measures 1.08 cm. Aortic valve mean gradient measures 25.7 mmHg. Aortic  valve Vmax measures 3.21 m/s.  6. The inferior vena cava is normal in size with greater than 50%  respiratory variability, suggesting right atrial pressure of 3 mmHg.   Recent Labs: 09/18/2019: BUN 26; Creatinine, Ser 1.00; Hemoglobin 12.0; Platelets 313; Potassium 4.5; Sodium 141   Recent Lipid Panel    Component Value Date/Time   CHOL 245 (H) 12/28/2017 1506   TRIG 55 12/28/2017 1506   HDL 112 12/28/2017 1506   CHOLHDL 2.2 12/28/2017 1506   CHOLHDL 3.2 10/15/2012 1341   VLDL 19 10/15/2012 1341   LDLCALC 122 (H) 12/28/2017 1506    Physical Exam:   VS:  BP 128/76   Pulse 80   Temp (!) 97.4 F (36.3 C)   Ht 5\' 8"  (1.727 m)   Wt 258 lb (117 kg)   SpO2 95%   BMI 39.23 kg/m    Wt Readings from Last 3 Encounters:  10/03/19 258 lb (117 kg)  07/09/19 252 lb (114.3 kg)  07/02/19 252 lb (114.3 kg)    General: Well nourished, well developed, in no acute distress Heart: Atraumatic, normal size  Eyes: PEERLA, EOMI  Neck: Supple, no JVD Endocrine: No thryomegaly Cardiac: Normal S1, S2; RRR; 2 out of 6 systolic ejection murmur, early peaking Lungs: Clear to auscultation bilaterally, no wheezing, rhonchi or rales  Abd: Soft, nontender, no hepatomegaly  Ext: No edema, pulses 2+ Musculoskeletal: No deformities, BUE and BLE strength normal and equal Skin: Warm and dry, no rashes   Neuro: Alert and oriented to person, place, time, and situation, CNII-XII grossly intact, no focal deficits  Psych: Normal mood and affect   ASSESSMENT:   Linda Allen is a 74 y.o. female who presents for the following: 1. Nonrheumatic aortic valve stenosis   2. Obesity (BMI 30-39.9)     PLAN:   1. Nonrheumatic aortic valve stenosis -Vmax 3.2 m/s, MG 25.7 mmHG, AVA 1.08 cm2 -She has moderate aortic stenosis.  I suspect she will  need a possible procedure in the next 2 to 3 years.  Aortic valve area is approaching 1 cm.  I recommended to repeat an echocardiogram in 1 year.  I will see her back at that time.  She needs to get more active.  Blood pressure is well controlled.  No symptoms reported.  2. Obesity (BMI 30-39.9) -Diet and exercise recommended.  Disposition: Return in about 1 year (around 10/02/2020).  Medication Adjustments/Labs and Tests Ordered: Current medicines are reviewed at length with the patient today.  Concerns regarding medicines are outlined above.  Orders Placed This Encounter  Procedures  . ECHOCARDIOGRAM COMPLETE   No orders of the defined types were placed in this encounter.   Patient Instructions  Medication Instructions:  No Changes In Medications at this time.  *If you need a refill on your cardiac medications before your next appointment, please call your pharmacy*  Lab Work: None Ordered At This Time.  If you have labs (blood work) drawn today and your tests are completely normal, you will receive your results only by: Marland Kitchen MyChart Message (if you have MyChart) OR . A paper copy in the mail If you have any lab test that is abnormal or we need to change your treatment, we will call you to review the results.  Testing/Procedures: Your physician has requested that you have an echocardiogram in 1 year . Echocardiography is a painless test that uses sound waves to create images of your heart. It provides your doctor with information about the size and shape of your heart and how well your heart's chambers and valves are working. You may receive an ultrasound enhancing agent through an IV if needed to better visualize your heart during the echo.This procedure takes approximately one hour. There are no restrictions for this procedure. This will take place at the [address].   Follow-Up: At [facility], you and your health needs are our priority.  As part of our  continuing mission to provide you with exceptional heart care, we have created designated Provider Care Teams.  These Care Teams include your primary Cardiologist (physician) and Advanced Practice Providers (APPs -  Physician Assistants and Nurse Practitioners) who all work together to provide you with the care you need, when you need it.  We recommend signing up for the patient portal called "MyChart".  Sign up information is provided on this After Visit Summary.  MyChart is used to connect with patients for Virtual Visits (Telemedicine).  Patients are able to view lab/test results, encounter notes, upcoming appointments, etc.  Non-urgent messages can be sent to your provider as well.   To learn more about what you can do with MyChart, go to [email or web address].    Your next appointment:   1 year(s) after echo is completed  The format for your next appointment:   In Person  Provider:   Eleonore Chiquito, MD     Time Spent with Patient: I have spent a total of 25 minutes with patient reviewing hospital notes, telemetry, EKGs, labs and examining the patient as well as establishing an assessment and plan that was discussed with the patient.  > 50% of time was spent in direct patient care.  Signed, Addison Naegeli. Audie Box, [place]  [address], [place], [place] [address] [phone]  10/03/2019 10:52 AM

## 2019-10-03 ENCOUNTER — Other Ambulatory Visit: Payer: Self-pay

## 2019-10-03 ENCOUNTER — Encounter: Payer: Self-pay | Admitting: Cardiovascular Disease

## 2019-10-03 ENCOUNTER — Ambulatory Visit: Payer: Medicare PPO | Admitting: Cardiovascular Disease

## 2019-10-03 VITALS — BP 128/76 | HR 80 | Temp 97.4°F | Ht 68.0 in | Wt 258.0 lb

## 2019-10-03 DIAGNOSIS — E669 Obesity, unspecified: Secondary | ICD-10-CM | POA: Diagnosis not present

## 2019-10-03 DIAGNOSIS — I350 Nonrheumatic aortic (valve) stenosis: Secondary | ICD-10-CM | POA: Diagnosis not present

## 2019-10-03 NOTE — Patient Instructions (Signed)
Medication Instructions:  No Changes In Medications at this time.  *If you need a refill on your cardiac medications before your next appointment, please call your pharmacy*  Lab Work: None Ordered At This Time.  If you have labs (blood work) drawn today and your tests are completely normal, you will receive your results only by: Marland Kitchen MyChart Message (if you have MyChart) OR . A paper copy in the mail If you have any lab test that is abnormal or we need to change your treatment, we will call you to review the results.  Testing/Procedures: Your physician has requested that you have an echocardiogram in 1 year . Echocardiography is a painless test that uses sound waves to create images of your heart. It provides your doctor with information about the size and shape of your heart and how well your heart's chambers and valves are working. You may receive an ultrasound enhancing agent through an IV if needed to better visualize your heart during the echo.This procedure takes approximately one hour. There are no restrictions for this procedure. This will take place at the [address].   Follow-Up: At [facility], you and your health needs are our priority.  As part of our continuing mission to provide you with exceptional heart care, we have created designated Provider Care Teams.  These Care Teams include your primary Cardiologist (physician) and Advanced Practice Providers (APPs -  Physician Assistants and Nurse Practitioners) who all work together to provide you with the care you need, when you need it.  We recommend signing up for the patient portal called "MyChart".  Sign up information is provided on this After Visit Summary.  MyChart is used to connect with patients for Virtual Visits (Telemedicine).  Patients are able to view lab/test results, encounter notes, upcoming appointments, etc.  Non-urgent messages can be sent to your provider as well.   To learn more about what you can  do with MyChart, go to [email or web address].    Your next appointment:   1 year(s) after echo is completed  The format for your next appointment:   In Person  Provider:   Eleonore Chiquito, MD

## 2019-10-04 NOTE — Addendum Note (Signed)
Addended by: Merri Ray A on: 10/04/2019 04:28 PM   Modules accepted: Orders

## 2019-10-30 ENCOUNTER — Encounter: Payer: Self-pay | Admitting: Dermatology

## 2019-10-30 ENCOUNTER — Other Ambulatory Visit: Payer: Self-pay

## 2019-10-30 ENCOUNTER — Ambulatory Visit: Payer: Medicare PPO | Admitting: Dermatology

## 2019-10-30 DIAGNOSIS — Z86018 Personal history of other benign neoplasm: Secondary | ICD-10-CM | POA: Diagnosis not present

## 2019-10-30 DIAGNOSIS — I872 Venous insufficiency (chronic) (peripheral): Secondary | ICD-10-CM

## 2019-10-30 DIAGNOSIS — D1801 Hemangioma of skin and subcutaneous tissue: Secondary | ICD-10-CM

## 2019-10-30 DIAGNOSIS — L309 Dermatitis, unspecified: Secondary | ICD-10-CM | POA: Diagnosis not present

## 2019-10-30 DIAGNOSIS — Z87898 Personal history of other specified conditions: Secondary | ICD-10-CM

## 2019-10-30 MED ORDER — HALOBETASOL PROPIONATE 0.05 % EX CREA: TOPICAL_CREAM | CUTANEOUS | 2 refills | Status: DC

## 2019-10-30 NOTE — Patient Instructions (Addendum)
3 dermatological issues addressed today with Linda Allen date of birth November 14, 1945.  First was some concern particularly on the part of her daughter for up dark relatively new spot on the right upper arm.  Examination of the right triceps showed a 74mm deep purple sharply defined dome-shaped papule; with dermoscopy this is quite typical for a benign angioma and does not currently require biopsy or close watching.  Secondly [name] was kind enough to provide me with photographs of a patchy itchy rash on her left arm which spontaneously resolved.  Most recently, she developed just days ago a swelling and redness on her right lower leg followed by a rash on her right arm.  Clinically the right arm is a patchy edematous dermatitis which could fit either contact dermatitis or an id reaction.  Visibly the right lower leg would fit early onset lipodermatosclerosis, but this is typically a much more chronic condition following a long history of swelling and often stasis dermatitis.  Pedal pulses are intact.  We will hold on doing any type of venous mapping or contact dermatitis testing for now.  Prescription given for halobetasol cream (if this has a high co-pay she or the pharmacist can call me and we can substitute).  Ideally this could be applied and then covered with a simple clean moist towel for 30 minutes.  After that time the moist wrap is removed, the area dried, and the cream reapplied.  If there is good improvement within 2 weeks, the moist wrap can be stopped and the cream could simply be applied after bathing for another 10 to 14 days.  Initial follow-up by phone in 3weeks.  If this fails to improve, progression particularly on the leg, we may refer for noninvasive vascular testing.

## 2019-10-31 ENCOUNTER — Telehealth: Payer: Self-pay | Admitting: Cardiovascular Disease

## 2019-10-31 LAB — TSH: TSH: 2.71 (ref 0.41–5.90)

## 2019-10-31 LAB — HEPATIC FUNCTION PANEL
ALT: 7 (ref 7–35)
AST: 12 — AB (ref 13–35)
Alkaline Phosphatase: 75 (ref 25–125)
Bilirubin, Direct: 0.14 (ref 0.01–0.4)
Bilirubin, Total: 0.4

## 2019-10-31 NOTE — Progress Notes (Signed)
   Follow-Up Visit   Subjective  Linda Allen is a 74 y.o. female who presents for the following: Skin Problem (RIGHT LOWER LEG SWELLING NO ITCH, RIGHT ARM DOES ITCH FOR DAYS . NOVEMBER 2019 RASH LEFT ARM CLEARED WITH PREDNISONE).  Rash Location: Right arm and leg Duration: Perhaps just the past week or so Quality: Getting worse Associated Signs/Symptoms: Itch especially arm Modifying Factors:  Severity:  Timing: Context:   Objective  Well appearing patient in no apparent distress; mood and affect are within normal limits.  A focused examination was performed including Head, neck, back, arms, legs, pedal pulses.. Relevant physical exam findings are noted in the Assessment and Plan.   Assessment & Plan    History of atypical nevus Mid Occipital Scalp  Annual complete skin examination.  Venous stasis dermatitis of right lower extremity Right Lower Leg - Anterior  Apply halobetasol cream nightly, encouraged to cover with a moist wrap for 30 minutes then dry and reapply the cream.  Discussed ways to minimize venous pressure.  Call office in 3 weeks with an update.  Dermatitis Right Forearm - Posterior  Apply halobetasol cream after bathing.  Call office in 3 weeks with an update.  Hemangioma of skin Right Upper Arm - Anterior  Benign okay to leave.  3 dermatological issues addressed today with Linda Allen date of birth 15-Oct-1945.  First was some concern particularly on the part of her daughter for up dark relatively new spot on the right upper arm.  Examination of the right triceps showed a 69mm deep purple sharply defined dome-shaped papule; with dermoscopy this is quite typical for a benign angioma and does not currently require biopsy or close watching.  Secondly Linda Allen was kind enough to provide me with photographs of a patchy itchy rash on her left arm which spontaneously resolved.  Most recently, she developed just days ago a swelling and redness  on her right lower leg followed by a rash on her right arm.  Clinically the right arm is a patchy edematous dermatitis which could fit either contact dermatitis or an id reaction.  Visibly the right lower leg would fit early onset lipodermatosclerosis, but this is typically a much more chronic condition following a long history of swelling and often stasis dermatitis.  Pedal pulses are intact.  We will hold on doing any type of venous mapping or contact dermatitis testing for now.  Prescription given for halobetasol cream (if this has a high co-pay she or the pharmacist can call me and we can substitute).  Ideally this could be applied and then covered with a simple clean moist towel for 30 minutes.  After that time the moist wrap is removed, the area dried, and the cream reapplied.  If there is good improvement within 2 weeks, the moist wrap can be stopped and the cream could simply be applied after bathing for another 10 to 14 days.  Initial follow-up by phone in 3weeks.  If this fails to improve, progression particularly on the leg, we may refer for noninvasive vascular testing.   I, Linda Monarch, MD, have reviewed all documentation for this visit.  The documentation on 12/07/19 for the exam, diagnosis, procedures, and orders are all accurate and complete.

## 2019-10-31 NOTE — Telephone Encounter (Signed)
   [facility] Pre-operative Risk Assessment    HEARTCARE STAFF: - Please ensure there is not already an duplicate clearance open for this procedure. - Under Visit Info/Reason for Call, type in Other and utilize the format Clearance MM/DD/YY or Clearance TBD. Do not use dashes or single digits. - If request is for dental extraction, please clarify the # of teeth to be extracted.  Request for surgical clearance:  1. What type of surgery is being performed? Colonoscopy    2. When is this surgery scheduled? 12/27/2019   3. What type of clearance is required (medical clearance vs. Pharmacy clearance to hold med vs. Both)? Medical    4. Are there any medications that need to be held prior to surgery and how long?  N/A  5. Practice name and name of physician performing surgery? Dr. Juanita Craver with [facility] [place]   6. What is the office phone number? [phone]   7.   What is the office fax number? [phone]  8.   Anesthesia type (None, local, MAC, general) ? Propofol   Sheral Apley M 10/31/2019, 2:57 PM  _________________________________________________________________   (provider comments below)

## 2019-11-01 ENCOUNTER — Encounter: Payer: Self-pay | Admitting: Nurse Practitioner

## 2019-11-01 NOTE — Telephone Encounter (Signed)
   Primary Cardiologist: Evalina Field, MD  Chart reviewed as part of pre-operative protocol coverage. Patient scheduled for colonoscopy on 12/27/2019. She was last seen by Dr. Audie Box on 10/03/2019 at which time she was doing well from a cardiac standpoint. Patient was contacted today for additional pre-op evaluation. She states she has done well from a cardiac standpoint since her last visit with no significant changes. She is just starting to be more active following recent shoulder shoulder. No chest pain, shortness of breath, acute CHF symptoms, or syncope. She is able to complete >4.0 with no problems. Given past medical history and time since last visit, based on ACC/AHA guidelines, Linda Allen would be at acceptable risk for the planned procedure without further cardiovascular testing.   The patient was advised that if she develops new symptoms prior to surgery to contact our office to arrange for a follow-up visit, and she verbalized understanding.  I will route this recommendation to the requesting party via Epic fax function and remove from pre-op pool.  Please call with questions.  Darreld Mclean, PA-C 11/01/2019, 10:25 AM

## 2019-11-04 LAB — HM MAMMOGRAPHY

## 2019-11-06 ENCOUNTER — Other Ambulatory Visit: Payer: Self-pay | Admitting: Orthopedic Surgery

## 2019-11-06 ENCOUNTER — Other Ambulatory Visit: Payer: Self-pay

## 2019-11-06 ENCOUNTER — Ambulatory Visit
Admission: RE | Admit: 2019-11-06 | Discharge: 2019-11-06 | Disposition: A | Discharge status: Home / Self Care | Payer: Medicare PPO | Source: Ambulatory Visit | Attending: Orthopedic Surgery | Admitting: Orthopedic Surgery

## 2019-11-06 DIAGNOSIS — Z4789 Encounter for other orthopedic aftercare: Secondary | ICD-10-CM

## 2019-11-07 ENCOUNTER — Ambulatory Visit (HOSPITAL_COMMUNITY)
Admission: EM | Admit: 2019-11-07 | Discharge: 2019-11-07 | Disposition: A | Discharge status: Home / Self Care | Payer: Medicare PPO | Attending: Family Medicine | Admitting: Family Medicine

## 2019-11-07 ENCOUNTER — Encounter (HOSPITAL_COMMUNITY): Payer: Self-pay | Admitting: Emergency Medicine

## 2019-11-07 ENCOUNTER — Other Ambulatory Visit: Payer: Self-pay

## 2019-11-07 DIAGNOSIS — R21 Rash and other nonspecific skin eruption: Secondary | ICD-10-CM | POA: Diagnosis present

## 2019-11-07 DIAGNOSIS — L309 Dermatitis, unspecified: Secondary | ICD-10-CM | POA: Insufficient documentation

## 2019-11-07 DIAGNOSIS — I872 Venous insufficiency (chronic) (peripheral): Secondary | ICD-10-CM | POA: Diagnosis present

## 2019-11-07 LAB — CBC WITH DIFFERENTIAL/PLATELET
Abs Immature Granulocytes: 0.01 10*3/uL (ref 0.00–0.07)
Basophils Absolute: 0 10*3/uL (ref 0.0–0.1)
Basophils Relative: 1 %
Eosinophils Absolute: 0.4 10*3/uL (ref 0.0–0.5)
Eosinophils Relative: 8 %
HCT: 37.5 % (ref 36.0–46.0)
Hemoglobin: 11.8 g/dL — ABNORMAL LOW (ref 12.0–15.0)
Immature Granulocytes: 0 %
Lymphocytes Relative: 35 %
Lymphs Abs: 1.7 10*3/uL (ref 0.7–4.0)
MCH: 30.9 pg (ref 26.0–34.0)
MCHC: 31.5 g/dL (ref 30.0–36.0)
MCV: 98.2 fL (ref 80.0–100.0)
Monocytes Absolute: 0.4 10*3/uL (ref 0.1–1.0)
Monocytes Relative: 8 %
Neutro Abs: 2.2 10*3/uL (ref 1.7–7.7)
Neutrophils Relative %: 48 %
Platelets: 346 10*3/uL (ref 150–400)
RBC: 3.82 MIL/uL — ABNORMAL LOW (ref 3.87–5.11)
RDW: 14.1 % (ref 11.5–15.5)
WBC: 4.8 10*3/uL (ref 4.0–10.5)
nRBC: 0 % (ref 0.0–0.2)

## 2019-11-07 LAB — COMPREHENSIVE METABOLIC PANEL
ALT: 15 U/L (ref 0–44)
AST: 18 U/L (ref 15–41)
Albumin: 3.7 g/dL (ref 3.5–5.0)
Alkaline Phosphatase: 63 U/L (ref 38–126)
Anion gap: 11 (ref 5–15)
BUN: 16 mg/dL (ref 8–23)
CO2: 26 mmol/L (ref 22–32)
Calcium: 9.7 mg/dL (ref 8.9–10.3)
Chloride: 102 mmol/L (ref 98–111)
Creatinine, Ser: 1.02 mg/dL — ABNORMAL HIGH (ref 0.44–1.00)
GFR, Estimated: 58 mL/min — ABNORMAL LOW (ref >60)
Glucose, Bld: 101 mg/dL — ABNORMAL HIGH (ref 70–99)
Potassium: 4.1 mmol/L (ref 3.5–5.1)
Sodium: 139 mmol/L (ref 135–145)
Total Bilirubin: 0.8 mg/dL (ref 0.3–1.2)
Total Protein: 7.2 g/dL (ref 6.5–8.1)

## 2019-11-07 MED ORDER — HALOBETASOL PROPIONATE 0.05 % EX CREA: TOPICAL_CREAM | CUTANEOUS | 2 refills | Status: DC

## 2019-11-07 MED ORDER — PERMETHRIN 5 % EX CREA: TOPICAL_CREAM | CUTANEOUS | 1 refills | Status: DC

## 2019-11-07 MED ORDER — METHYLPREDNISOLONE SODIUM SUCC 125 MG IJ SOLR
80.0000 mg | Freq: Once | INTRAMUSCULAR | Status: AC
  Administered 2019-11-07: 80 mg via INTRAMUSCULAR

## 2019-11-07 MED ORDER — HYDROXYZINE HCL 25 MG PO TABS: 25.0000 mg | ORAL_TABLET | Freq: Four times a day (QID) | ORAL | 0 refills | Status: DC

## 2019-11-07 MED ORDER — METHYLPREDNISOLONE SODIUM SUCC 125 MG IJ SOLR
INTRAMUSCULAR | Status: AC
  Filled 2019-11-07: qty 2

## 2019-11-07 NOTE — Discharge Instructions (Addendum)
Steroid injection given here Treating for possible scabies.  Apply the cream from the neck down and leave on for 8 to 12 hours.  Repeat in 2 weeks if needed. Keep skin moisturized, Eucerin cream is a good choice  Hydroxyzine for itching as needed.  Take 1/2-1 full tab every 6 hours as needed. Wash all bedding in hot water

## 2019-11-07 NOTE — ED Provider Notes (Signed)
[place]    CSN: [id number] Arrival date & time: 11/07/19  1962      History   Chief Complaint Chief Complaint  Patient presents with  . Rash    HPI Linda Allen is a 74 y.o. female.   Patient is a 74 year old female who presents today with a rash.  This is been present and worsening past week.  The rash is extremely itchy and feels like "bugs are crawling".  She has been using Benadryl without any relief.   Denies any fever, joint pain. Denies any recent changes in lotions, detergents, foods or other possible irritants. No recent travel. Nobody else at home has the rash. Patient has been outside but denies any contact with plants or insects. No new foods or medications.        Past Medical History:  Diagnosis Date  . Allergy   . Arthritis    L hip  . Atypical mole 01/21/2014   SEBACECIOUS ATYPIA RIGHT SCALP = MOHS  . Heart murmur   . Hypertension   . Scleroderma [facility])     Patient Active Problem List   Diagnosis Date Noted  . Left hip OA 07/09/2019  . S/P left THA, AA 07/09/2019  . S/P hip replacement, left 07/09/2019  . Arthritis L hip, R shoulder, both knees   . MORBID OBESITY 01/12/2010  . Essential hypertension 12/22/2008  . MURMUR 12/22/2008    Past Surgical History:  Procedure Laterality Date  . ENDOVENOUS ABLATION SAPHENOUS VEIN W/ LASER Left 09/26/2018   endovenous laser ablation left greater saphenous vein by Ruta Hinds MD   . ENDOVENOUS ABLATION SAPHENOUS VEIN W/ LASER Right 10/17/2018   endovenous laser ablation right greater saphenous vein by Ruta Hinds MD   . JOINT REPLACEMENT  2011, 2012   both knees  . REVERSE SHOULDER ARTHROPLASTY Left 09/20/2019   Procedure: REVERSE SHOULDER ARTHROPLASTY;  Surgeon: Netta Cedars, MD;  Location: [facility];  Service: Orthopedics;  Laterality: Left;  . SHOULDER ACROMIOPLASTY Right 2012  . TOTAL HIP ARTHROPLASTY Left 07/09/2019   Procedure: TOTAL HIP ARTHROPLASTY ANTERIOR APPROACH;   Surgeon: Paralee Cancel, MD;  Location: [facility];  Service: Orthopedics;  Laterality: Left;  70 mins    OB History   No obstetric history on file.      Home Medications    Prior to Admission medications   Medication Sig Start Date End Date Taking? Authorizing Provider  hydrOXYzine (ATARAX/VISTARIL) 25 MG tablet Take 1 tablet (25 mg total) by mouth every 6 (six) hours. Take 1/2-1 full tab every 6 hours as needed 11/07/19   Loura Halt A, NP  permethrin (ELIMITE) 5 % cream Apply to neck down on entire body and leave on for 8 to 12 hours Repeat in 2 weeks if needed 11/07/19   Loura Halt A, NP  ferrous sulfate (FERROUSUL) 325 (65 FE) MG tablet Take 1 tablet (325 mg total) by mouth 3 (three) times daily with meals for 14 days. Patient not taking: Reported on 09/12/2019 07/10/19 11/07/19  Danae Orleans, PA-C    Family History Family History  Problem Relation Age of Onset  . CVA Mother   . Parkinson's disease Father   . CVA Father   . Heart attack Father   . Eczema Sister   . Ulcerative colitis Brother   . Heart failure Paternal Grandmother     Social History Social History   Tobacco Use  . Smoking status: Former Smoker    Years: 10.00  .  Smokeless tobacco: Never Used  . Tobacco comment: smoked for 10 yeas, d/c 40 y ago  Vaping Use  . Vaping Use: Never used  Substance Use Topics  . Alcohol use: Yes    Comment: occas.  . Drug use: Never     Allergies   Bee venom, Ivp dye [iodinated diagnostic agents], and Penicillins   Review of Systems Review of Systems   Physical Exam Triage Vital Signs ED Triage Vitals  Enc Vitals Group     BP 11/07/19 0852 (!) 147/79     Pulse Rate 11/07/19 0852 88     Resp 11/07/19 0852 17     Temp 11/07/19 0852 98.1 F (36.7 C)     Temp Source 11/07/19 0852 Oral     SpO2 11/07/19 0852 98 %     Weight --      Height --      Head Circumference --      Peak Flow --      Pain Score 11/07/19 0850 0     Pain Loc --      Pain Edu? --       Excl. in [place]? --    No data found.  Updated Vital Signs BP (!) 147/79 (BP Location: Right Arm)   Pulse 88   Temp 98.1 F (36.7 C) (Oral)   Resp 17   SpO2 98%   Visual Acuity Right Eye Distance:   Left Eye Distance:   Bilateral Distance:    Right Eye Near:   Left Eye Near:    Bilateral Near:     Physical Exam Vitals and nursing note reviewed.  Constitutional:      General: She is not in acute distress.    Appearance: Normal appearance. She is not ill-appearing, toxic-appearing or diaphoretic.  HENT:     Head: Normocephalic.     Nose: Nose normal.  Eyes:     Conjunctiva/sclera: Conjunctivae normal.  Pulmonary:     Effort: Pulmonary effort is normal.  Musculoskeletal:        General: Normal range of motion.     Cervical back: Normal range of motion.  Skin:    General: Skin is warm and dry.     Findings: Rash present.     Comments: Scattered erythema with excoriations to chest, upper back, arms. Some papules.    Neurological:     Mental Status: She is alert.  Psychiatric:        Mood and Affect: Mood normal.      UC Treatments / Results  Labs (all labs ordered are listed, but only abnormal results are displayed) Labs Reviewed  CBC WITH DIFFERENTIAL/PLATELET - Abnormal; Notable for the following components:      Result Value   RBC 3.82 (*)    Hemoglobin 11.8 (*)    All other components within normal limits  COMPREHENSIVE METABOLIC PANEL - Abnormal; Notable for the following components:   Glucose, Bld 101 (*)    Creatinine, Ser 1.02 (*)    GFR, Estimated 58 (*)    All other components within normal limits    EKG   Radiology CT SHOULDER LEFT WO CONTRAST  Result Date: 11/07/2019 CLINICAL DATA:  History of reversed total shoulder arthroplasty 3 weeks ago. Left shoulder pain for 1 week. EXAM: CT OF THE UPPER LEFT EXTREMITY WITHOUT CONTRAST TECHNIQUE: Multidetector CT imaging of the upper left extremity was performed according to the standard protocol.  COMPARISON:  None. FINDINGS: The reversed total shoulder arthroplasty components  appears to be in good position. No complicating features are identified. I do not see any evidence of a periprosthetic fracture. The fixating glenoid screws are in place. No screw fractures. No glenoid or scapular fracture is identified. The [facility] joint is intact. Moderate degenerative changes. There is some inferior spurring from the acromion. The surrounding shoulder musculature is unremarkable. No obvious muscle tear or intramuscular hematoma. No worrisome periarticular fluid collections. The visualized left ribs are intact and the visualized left lung is clear. IMPRESSION: 1. Intact reversed total shoulder arthroplasty components without complicating features. 2. Moderate degenerative changes at the [facility] joint. 3. No obvious muscle tear or intramuscular hematoma. Electronically Signed   By: Marijo Sanes M.D.   On: 11/07/2019 08:50    Procedures Procedures (including critical care time)  Medications Ordered in UC Medications  methylPREDNISolone sodium succinate (SOLU-MEDROL) 125 mg/2 mL injection 80 mg (80 mg Intramuscular Given 11/07/19 0946)    Initial Impression / Assessment and Plan / UC Course  I have reviewed the triage vital signs and the nursing notes.  Pertinent labs & imaging results that were available during my care of the patient were reviewed by me and considered in my medical decision making (see chart for details).     Rash Not sure if this is another allergic reaction versus scabies versus extremely dry skin Steroid injection given here for symptoms We will go ahead and cover for scabies with permethrin cream with instructions on how to use Hydroxyzine for itching as needed.  Recommend keeping skin moisturized with Eucerin cream Wash all bedding in hot water Follow up as needed for continued or worsening symptoms  Final Clinical Impressions(s) / UC Diagnoses   Final diagnoses:  Rash      Discharge Instructions     Steroid injection given here Treating for possible scabies.  Apply the cream from the neck down and leave on for 8 to 12 hours.  Repeat in 2 weeks if needed. Keep skin moisturized, Eucerin cream is a good choice  Hydroxyzine for itching as needed.  Take 1/2-1 full tab every 6 hours as needed. Wash all bedding in hot water    ED Prescriptions    Medication Sig Dispense Auth. Provider   permethrin (ELIMITE) 5 % cream Apply to neck down on entire body and leave on for 8 to 12 hours Repeat in 2 weeks if needed 60 g Ceirra Belli A, NP   hydrOXYzine (ATARAX/VISTARIL) 25 MG tablet Take 1 tablet (25 mg total) by mouth every 6 (six) hours. Take 1/2-1 full tab every 6 hours as needed 20 tablet Lekisha Mcghee A, NP   halobetasol (ULTRAVATE) 0.05 % cream  (Status: Discontinued) Apply to affected area after bathing 50 g Vicki Pasqual A, NP     PDMP not reviewed this encounter.   Orvan July, NP 11/07/19 1154

## 2019-11-07 NOTE — ED Triage Notes (Signed)
Pt presents with rash/hives xs 1 week. States started on wrist and moved to all upper body and left leg. States itches and burns.

## 2019-11-12 ENCOUNTER — Encounter: Payer: Self-pay | Admitting: Nurse Practitioner

## 2019-11-12 ENCOUNTER — Telehealth (HOSPITAL_COMMUNITY): Payer: Self-pay | Admitting: Emergency Medicine

## 2019-11-12 NOTE — Telephone Encounter (Signed)
Patient left a voicemail to go over lab results.  Provider reviewed, and no specific concerns.  Called patient to go over this, and she states she is still itching and her rash seems to be spreading.  Reviewed with Traci, APP who states rehydration is important.  Reviewed lotion, no hot showers, humidifiers, and hydroxyzine.  Encouraged dermatology follow-up if symptoms persist.  Patient verbalized understanding.

## 2019-11-13 ENCOUNTER — Telehealth: Payer: Self-pay | Admitting: Dermatology

## 2019-11-13 NOTE — Telephone Encounter (Signed)
Patient was seen a couple weeks ago and had a rash on right forearm. Since then the rash has spread to both arms, stomach, back and starting on both legs. She was seen at urgent care and they gave steroid shot and she was treated for scabies. She was given hydroxyzine 25 mg (not helpful at all) [facility] [place] [facility] [address]. Does patient need to be seen again or can ST try other treatment?

## 2019-11-14 ENCOUNTER — Ambulatory Visit: Payer: Medicare PPO | Admitting: Dermatology

## 2019-11-14 ENCOUNTER — Encounter: Payer: Self-pay | Admitting: Dermatology

## 2019-11-14 ENCOUNTER — Other Ambulatory Visit: Payer: Self-pay

## 2019-11-14 DIAGNOSIS — D485 Neoplasm of uncertain behavior of skin: Secondary | ICD-10-CM

## 2019-11-14 DIAGNOSIS — R21 Rash and other nonspecific skin eruption: Secondary | ICD-10-CM

## 2019-11-14 DIAGNOSIS — Z5181 Encounter for therapeutic drug level monitoring: Secondary | ICD-10-CM

## 2019-11-14 MED ORDER — PREDNISONE 20 MG PO TABS: ORAL_TABLET | ORAL | 0 refills | Status: DC

## 2019-11-14 NOTE — Telephone Encounter (Signed)
Dr. Denna Haggard is less than certain about both the diagnosis and the treatment provided at the walk-in clinic and feels it is best for you to be seen.  We will try and add her on either today or Monday.

## 2019-11-14 NOTE — Telephone Encounter (Signed)
Patient is calling to see what she is supposed to do.  Her rash is continuing to spread.

## 2019-11-14 NOTE — Telephone Encounter (Signed)
Phone call to patient with Dr. Onalee Hua recommendations.  Per patient she can't wait until Monday so she would like to be worked in today to be seen.  Appointment scheduled for patient to be seen this morning.

## 2019-11-14 NOTE — Progress Notes (Signed)
Patient was seen a couple weeks ago and had a rash on right forearm. Since then the rash has spread to both arms, stomach, back and starting on both legs. She was seen at urgent care and they gave steroid shot and she was treated for scabies. She was given hydroxyzine 25 mg (not helpful at all) [facility] [place] [facility] [address] Does patient need to be seen again or can ST try other treatment?

## 2019-11-15 ENCOUNTER — Telehealth: Payer: Self-pay | Admitting: Dermatology

## 2019-11-15 LAB — COMPLETE METABOLIC PANEL WITH GFR
AG Ratio: 1.3 (calc) (ref 1.0–2.5)
ALT: 13 U/L (ref 6–29)
AST: 15 U/L (ref 10–35)
Albumin: 4.1 g/dL (ref 3.6–5.1)
Alkaline phosphatase (APISO): 67 U/L (ref 37–153)
BUN/Creatinine Ratio: 30 (calc) — ABNORMAL HIGH (ref 6–22)
BUN: 33 mg/dL — ABNORMAL HIGH (ref 7–25)
CO2: 28 mmol/L (ref 20–32)
Calcium: 9.8 mg/dL (ref 8.6–10.4)
Chloride: 104 mmol/L (ref 98–110)
Creat: 1.11 mg/dL — ABNORMAL HIGH (ref 0.60–0.93)
GFR, Est African American: 57 mL/min/{1.73_m2} — ABNORMAL LOW (ref >=60)
GFR, Est Non African American: 50 mL/min/{1.73_m2} — ABNORMAL LOW (ref >=60)
Globulin: 3.1 g/dL (calc) (ref 1.9–3.7)
Glucose, Bld: 91 mg/dL (ref 65–99)
Potassium: 4.8 mmol/L (ref 3.5–5.3)
Sodium: 140 mmol/L (ref 135–146)
Total Bilirubin: 0.6 mg/dL (ref 0.2–1.2)
Total Protein: 7.2 g/dL (ref 6.1–8.1)

## 2019-11-15 LAB — CBC WITH DIFFERENTIAL/PLATELET
Absolute Monocytes: 556 cells/uL (ref 200–950)
Basophils Absolute: 57 cells/uL (ref 0–200)
Basophils Relative: 1.1 %
Eosinophils Absolute: 504 cells/uL — ABNORMAL HIGH (ref 15–500)
Eosinophils Relative: 9.7 %
HCT: 37.6 % (ref 35.0–45.0)
Hemoglobin: 12.5 g/dL (ref 11.7–15.5)
Lymphs Abs: 2012 cells/uL (ref 850–3900)
MCH: 31.6 pg (ref 27.0–33.0)
MCHC: 33.2 g/dL (ref 32.0–36.0)
MCV: 94.9 fL (ref 80.0–100.0)
MPV: 9.8 fL (ref 7.5–12.5)
Monocytes Relative: 10.7 %
Neutro Abs: 2070 cells/uL (ref 1500–7800)
Neutrophils Relative %: 39.8 %
Platelets: 376 10*3/uL (ref 140–400)
RBC: 3.96 10*6/uL (ref 3.80–5.10)
RDW: 12.9 % (ref 11.0–15.0)
Total Lymphocyte: 38.7 %
WBC: 5.2 10*3/uL (ref 3.8–10.8)

## 2019-11-15 LAB — SEDIMENTATION RATE: Sed Rate: 25 mm/h (ref 0–30)

## 2019-11-15 NOTE — Telephone Encounter (Signed)
Patient is calling to let [name], MD know that she is already much improved.  Patient is itching very little already.

## 2019-11-18 ENCOUNTER — Ambulatory Visit: Payer: Medicare PPO | Admitting: Dermatology

## 2019-11-18 ENCOUNTER — Telehealth: Payer: Self-pay | Admitting: Dermatology

## 2019-11-18 NOTE — Telephone Encounter (Signed)
Patient left message on office voice mail that she was calling to report that the rash has almost gone and that she is itching very little now.  Patient is also calling for her most recent lab results.

## 2019-11-19 ENCOUNTER — Telehealth: Payer: Self-pay | Admitting: Dermatology

## 2019-11-19 DIAGNOSIS — R21 Rash and other nonspecific skin eruption: Secondary | ICD-10-CM

## 2019-11-19 NOTE — Telephone Encounter (Signed)
Phone call to patient with her lab results and Dr. Onalee Hua recommendations.

## 2019-11-19 NOTE — Telephone Encounter (Signed)
Phone call to patient with her lab results and Dr. Onalee Hua recommendations.  Patient states that she is now itching again even with being on the prednisone and she wants to know what the next step will be?  Patient informed that I will get this message over to Dr. Denna Haggard and see what his recommendations will be.

## 2019-11-19 NOTE — Telephone Encounter (Signed)
Patient is calling to say that she was much improved, but this morning the intense itching has started again.  Patient wants to know what she should do.  Also patient would like for someone to go over the lab results with her since some of them are back.

## 2019-11-19 NOTE — Telephone Encounter (Signed)
Please inform patient that Dr. Darene Lamer reviewed her labs which were available for her to see the same day that I receive them.  All the labs essentially indicate that there is no known systemic cause for her widespread rash.  There are an increased number of so-called allergy cells (eosinophils), but this is commonly seen with any extensive rash.  I am most pleased that there is finally some clearing so she definitely should continue taper off the prednisone as discussed.  If the rash does not recur after the prednisone is finished there will be no further evaluation.  If it does recur, I would prefer to see her again rather than simply keeping her on more prednisone.  Please contact us in 2 to 3 weeks with a status report.

## 2019-11-20 NOTE — Telephone Encounter (Signed)
Patient aware biopsy is not back yet, also updated her with Dr Denna Haggard suggestions and at this time the patient stated she will wait for biopsy results and not move forward with second opinion yet. Continue prednisone as Dr Denna Haggard directed till Saturday and start taper x3 pills starting Sunday for 5 days then taper x2 pills for 5 days. I advised patient per Dr Denna Haggard second opinion is a must due to 6-8 week wait for visit with Dr Ronalee Red and patient agreed . Also reminded her she may flare after prednisone taper all info will be sent to Dr Ronalee Red today.

## 2019-11-20 NOTE — Telephone Encounter (Signed)
She can return to allow biopsies of the rash which may help further define what the cause is.  We also want her okay to schedule ASAP to see Dr. Juliet Rude at [facility] Department of dermatology to provide treatment suggestions other than more prednisone.  Please schedule with Dr. Ronalee Red when she okays this.

## 2019-11-21 ENCOUNTER — Telehealth: Payer: Self-pay

## 2019-11-21 NOTE — Telephone Encounter (Signed)
Patient called requesting a referral to an allergist. I returned her call and let her know that she will need an appointment before the referral can be made. She stated she seen Dr.Taffen and he referred her to an allergist but they cant see her until Feb and she feels she needs to be seen sooner. I advised her to give them a call and see if they can find another allergist for her. YL,RMA

## 2019-11-22 ENCOUNTER — Telehealth: Payer: Self-pay | Admitting: Dermatology

## 2019-11-22 DIAGNOSIS — R21 Rash and other nonspecific skin eruption: Secondary | ICD-10-CM

## 2019-11-22 NOTE — Telephone Encounter (Signed)
Patient is calling to say that in order to do the Prednisone taper that [name], MD wants her to do she will need 23 more pills.  Patient uses [facility] at [address] [place]  [phone].

## 2019-11-22 NOTE — Telephone Encounter (Signed)
See patient note it looks like we gave her 50 pills and the prescriptions says to take 4 po every day x 4 days. Not sure if she is taking it longer or not?

## 2019-11-23 NOTE — Telephone Encounter (Signed)
Our last conversation, as I recall, was to taper from 4 pills to 3 pills daily for 5 days then 2 daily for 5 days then call us.  I am okay with a one-time prescription for number 50-20 mg pills no refills, with the directions 3 by mouth daily.

## 2019-11-25 MED ORDER — PREDNISONE 20 MG PO TABS: ORAL_TABLET | ORAL | 0 refills | Status: DC

## 2019-11-25 NOTE — Telephone Encounter (Signed)
Patient called stating that she took the pills incorrectly and that she didn't have enough to finish taper as Dr Denna Haggard advised.  Okay per Dr. Denna Haggard to send in pills to finish taper.  Patient advised to take 3 pills x 3days and then 2 pills x 5 days to finish dosage/taper.  Told patient that results are back for biopsy but not yet reviewed by Dr. Denna Haggard.

## 2019-11-26 ENCOUNTER — Telehealth: Payer: Self-pay | Admitting: Dermatology

## 2019-11-26 NOTE — Telephone Encounter (Signed)
Phone call to patient with her pathology results and Dr. Tafeen's recommendations. Patient aware.  

## 2019-11-26 NOTE — Telephone Encounter (Signed)
Pathology suggests immune mediated non bullou disorder which can be contact dermatitis, medication allergy, or other source.  I am uncomfortable keeping people on long-term prednisone because of potential side effects but unfortunately I expect she will flare when the prednisone was tapered.  Hopefully she will be able to get an opinion from Dr. Ronalee Red as soon as possible.

## 2019-11-26 NOTE — Telephone Encounter (Signed)
Phone call from patient stating that she's okay with seeing Dr. Ronalee Red but she doesn't really think he's able to help her. She believes she needs to see an Electrical engineer and she wanted to know if we could refer her to one?  I informed patient that if she wanted to be referred to an Immunologist she would need to contact her PCP to be referred.  Patient aware.

## 2019-12-04 ENCOUNTER — Encounter: Payer: Self-pay | Admitting: Dermatology

## 2019-12-05 NOTE — Progress Notes (Signed)
   Follow-Up Visit   Subjective  Linda Allen is a 74 y.o. female who presents for the following: Follow-up (rash worse).  Rash Location: Body Duration:  Quality:  Associated Signs/Symptoms: Life altering itch Modifying Factors: Shot of prednisone plus hydroxyzine of no benefit Severity:  Timing: No known trigger, no new medications or foods or exposures. Context:   Objective  Well appearing patient in no apparent distress; mood and affect are within normal limits.  A full examination was performed including scalp, head, eyes, ears, nose, lips, neck, chest, axillae, abdomen, back, buttocks, bilateral upper extremities, bilateral lower extremities, hands, feet, fingers, toes, fingernails, and toenails. All findings within normal limits unless otherwise noted below.  Also joints, mucosa, lymph nodes.   Assessment & Plan    Rash (12) Right Shoulder - Posterior; Neck - Anterior; Left Forearm - Anterior; Right Forearm - Anterior; Left Forearm - Posterior; Right Forearm - Posterior; Left Thigh - Anterior; Right Thigh - Anterior; Left Abdomen (side) - Lower; Right Abdomen (side) - Lower; Left Lower Back; Right Lower Back  Prednisone, from an initial three-quarter milligram per kilogram we will try and taper over approximately 4 weeks., and go to lab for blood work.  We will also obtain representative biopsy and hope of narrowing down diagnostic possibilities.  Sedimentation Rate - Left Abdomen (side) - Lower, Left Forearm - Anterior, Left Forearm - Posterior, Left Lower Back, Left Thigh - Anterior, Neck - Anterior, Right Abdomen (side) - Lower, Right Forearm - Anterior, Right Forearm - Posterior, Right Lower Back, Right Shoulder - Posterior, Right Thigh - Anterior  CBC with Differential/Platelets - Left Abdomen (side) - Lower, Left Forearm - Anterior, Left Forearm - Posterior, Left Lower Back, Left Thigh - Anterior, Neck - Anterior, Right Abdomen (side) - Lower, Right Forearm -  Anterior, Right Forearm - Posterior, Right Lower Back, Right Shoulder - Posterior, Right Thigh - Anterior  CMP with eGFR([facility]) - Left Abdomen (side) - Lower, Left Forearm - Anterior, Left Forearm - Posterior, Left Lower Back, Left Thigh - Anterior, Neck - Anterior, Right Abdomen (side) - Lower, Right Forearm - Anterior, Right Forearm - Posterior, Right Lower Back, Right Shoulder - Posterior, Right Thigh - Anterior  Other Related Medications predniSONE (DELTASONE) 20 MG tablet  Neoplasm of uncertain behavior of skin (2) Left Upper Back (2)  Skin / nail biopsy Type of biopsy: tangential   Informed consent: discussed and consent obtained   Timeout: patient name, date of birth, surgical site, and procedure verified   Procedure prep:  Patient was prepped and draped in usual sterile fashion (Non sterile) Prep type:  Chlorhexidine Anesthesia: the lesion was anesthetized in a standard fashion   Anesthetic:  1% lidocaine w/ epinephrine 1-100,000 local infiltration Instrument used: flexible razor blade   Outcome: patient tolerated procedure well   Post-procedure details: wound care instructions given    Specimen 1 - Surgical pathology Differential Diagnosis: urticarial dermatitis 30% BSA; rule out eczema, non-vesicular pemphigoid, contact dermatitis Check Margins: No Two specimens one bottle  Encounter for therapeutic drug monitoring  Other Related Procedures Sedimentation Rate CBC with Differential/Platelets CMP with eGFR([facility])      I, [name], MD, have reviewed all documentation for this visit.  The documentation on 12/05/19 for the exam, diagnosis, procedures, and orders are all accurate and complete.

## 2019-12-07 ENCOUNTER — Encounter: Payer: Self-pay | Admitting: Dermatology

## 2019-12-18 ENCOUNTER — Encounter: Payer: Self-pay | Admitting: Nurse Practitioner

## 2019-12-18 ENCOUNTER — Ambulatory Visit (INDEPENDENT_AMBULATORY_CARE_PROVIDER_SITE_OTHER): Payer: Medicare PPO | Admitting: Nurse Practitioner

## 2019-12-18 ENCOUNTER — Other Ambulatory Visit: Payer: Self-pay

## 2019-12-18 VITALS — BP 146/92 | HR 91 | Temp 97.6°F | Ht 68.0 in | Wt 274.0 lb

## 2019-12-18 DIAGNOSIS — Z8679 Personal history of other diseases of the circulatory system: Secondary | ICD-10-CM

## 2019-12-18 DIAGNOSIS — I10 Essential (primary) hypertension: Secondary | ICD-10-CM

## 2019-12-18 DIAGNOSIS — Z23 Encounter for immunization: Secondary | ICD-10-CM | POA: Diagnosis not present

## 2019-12-18 MED ORDER — LISINOPRIL 10 MG PO TABS: 10.0000 mg | ORAL_TABLET | Freq: Every day | ORAL | 11 refills | Status: DC

## 2019-12-18 NOTE — Patient Instructions (Signed)

## 2019-12-18 NOTE — Progress Notes (Signed)
I,Yamilka Roman [employer] as a Education administrator for [employer], FNP.,have documented all relevant documentation on the behalf of Minette Brine, FNP,as directed by  Minette Brine, FNP while in the presence of Minette Brine, [place]. This visit occurred during the SARS-CoV-2 public health emergency.  Safety protocols were in place, including screening questions prior to the visit, additional usage of staff PPE, and extensive cleaning of exam room while observing appropriate contact time as indicated for disinfecting solutions.  Subjective:     Patient ID: Linda Allen , female    DOB: 1945/06/20 , 74 y.o.   MRN: [id number]   Chief Complaint  Patient presents with  . Hypertension  . Rash    patient stated she has had a rash al over her body she did go to the dermatologist and they referred her to someone in [facility]     HPI  Patient here for a f/u on her blood pressure.  She has seen Dr. Audie Box in the last few months for cardiac clearance - has a history of heart murmur.  She had left hip replacement in June 29 and Sept had left shoulder surgery.  She did not have PT.  She has not been on any blood pressure medications in several years due to improving. She has drank adequate amounts of water in the last 24 hours. She does have a blood pressure cuff at home but has not been checking regularly.    She is scheduled to have an appt with immunologist and dermatologist.   Wt Readings from Last 3 Encounters: 12/18/19 : 274 lb (124.3 kg) 10/03/19 : 258 lb (117 kg) 07/09/19 : 252 lb (114.3 kg)   Hypertension This is a chronic problem. The current episode started more than 1 year ago. The problem is uncontrolled. Pertinent negatives include no anxiety, blurred vision, chest pain, headaches, peripheral edema or shortness of breath. There are no associated agents to hypertension. Risk factors for coronary artery disease include obesity, sedentary lifestyle and post-menopausal state. Past treatments  include nothing. There are no compliance problems.  There is no history of angina, kidney disease or CVA. There is no history of chronic renal disease.     Past Medical History:  Diagnosis Date  . Allergy   . Angio-edema   . Arthritis    L hip  . Atypical mole 01/21/2014   SEBACECIOUS ATYPIA RIGHT SCALP = MOHS  . Heart murmur   . Hypertension   . Scleroderma ([place])   . Urticaria      Family History  Problem Relation Age of Onset  . CVA Mother   . Parkinson's disease Father   . CVA Father   . Heart attack Father   . Eczema Sister   . Ulcerative colitis Brother   . Heart failure Paternal Grandmother      Current Outpatient Medications:  .  hydrOXYzine (ATARAX/VISTARIL) 10 MG tablet, 1-2 tablets 1 hour before bedtime as needed for itching, Disp: 60 tablet, Rfl: 2 .  lisinopril (ZESTRIL) 10 MG tablet, Take 1 tablet (10 mg total) by mouth daily., Disp: 30 tablet, Rfl: 11 .  mometasone (ELOCON) 0.1 % ointment, Apply topically 2 (two) times daily as needed. Do not use on the face, neck, armpits or groin area. Do not use more than 3 weeks in a row. Stop when clear and can restart as needed for flares., Disp: 90 g, Rfl: 2 .  triamcinolone ointment (KENALOG) 0.1 %, Apply 1 application topically 2 (two) times daily. Use as  a moisturizer all over your body., Disp: 453 g, Rfl: 2   Allergies  Allergen Reactions  . Bee Venom Shortness Of Breath  . Ivp Dye [Iodinated Diagnostic Agents] Shortness Of Breath and Rash  . Penicillins Hives and Rash    Tolerated Cephalosporin Date: 07/09/19.       Review of Systems  Constitutional: Negative.   Eyes: Negative for blurred vision.  Respiratory: Negative for shortness of breath.   Cardiovascular: Negative for chest pain.  Neurological: Negative for dizziness and headaches.  Psychiatric/Behavioral: Negative.      Today's Vitals   12/18/19 1412  BP: (!) 146/92  Pulse: 91  Temp: 97.6 F (36.4 C)  TempSrc: Oral  Weight: 274 lb (124.3 kg)   Height: 5\' 8"  (1.727 m)  PainSc: 0-No pain   Body mass index is 41.66 kg/m.   Objective:  Physical Exam Vitals reviewed.  Constitutional:      General: She is not in acute distress.    Appearance: Normal appearance. She is obese.  Cardiovascular:     Rate and Rhythm: Normal rate and regular rhythm.     Pulses: Normal pulses.     Heart sounds: Normal heart sounds. No murmur heard.   Pulmonary:     Effort: Pulmonary effort is normal. No respiratory distress.     Breath sounds: Normal breath sounds.  Skin:    General: Skin is warm.  Neurological:     General: No focal deficit present.     Mental Status: She is alert and oriented to person, place, and time.     Cranial Nerves: No cranial nerve deficit.  Psychiatric:        Mood and Affect: Mood normal.        Behavior: Behavior normal.        Thought Content: Thought content normal.        Judgment: Judgment normal.         Assessment And Plan:     1. Essential hypertension  Chronic, blood pressure is elevated today   Will start her back on lisinopril she is advised to monitor for cough or swelling to lips/tongue - lisinopril (ZESTRIL) 10 MG tablet; Take 1 tablet (10 mg total) by mouth daily.  Dispense: 30 tablet; Refill: 11  2. History of aortic stenosis Continue follow up with cardiology  3. Encounter for immunization  Sent prescription to pharmacy.  TDAP will be administered to adults 27-83 years old every 10 years. - Tdap vaccine greater than or equal to 7yo IM     Patient was given opportunity to ask questions. Patient verbalized understanding of the plan and was able to repeat key elements of the plan. All questions were answered to their satisfaction.  Minette Brine, FNP     I, Minette Brine, FNP, have reviewed all documentation for this visit. The documentation on 01/05/20 for the exam, diagnosis, procedures, and orders are all accurate and complete.   THE PATIENT IS ENCOURAGED TO PRACTICE SOCIAL  DISTANCING DUE TO THE COVID-19 PANDEMIC.

## 2019-12-23 ENCOUNTER — Telehealth: Payer: Self-pay | Admitting: Dermatology

## 2019-12-23 NOTE — Telephone Encounter (Signed)
Advice Only (Phone call from patient saying she is in complete misery. Pt said her rash is worse, she can't sleep, it itches, burns and she can't get any help from anyone. Pt said her appointment with Dr Ronalee Red is February 2nd and they can't get her in any sooner. Pt wants to know if Dr Denna Haggard has any other suggestions to give her some relief from the misery this rash is causing her? )

## 2019-12-24 NOTE — Telephone Encounter (Signed)
Phone call to patient to inform her that per Dr. Denna Haggard has no other options.  His only recommendation was to try and get in at [facility] with Dr Ronalee Red sooner than February 2nd.

## 2019-12-27 LAB — HM COLONOSCOPY

## 2019-12-30 ENCOUNTER — Encounter: Payer: Self-pay | Admitting: Nurse Practitioner

## 2019-12-31 NOTE — Progress Notes (Signed)
New Patient Note  RE: Linda Allen MRN: [id number] DOB: [birth year] Date of Office Visit: 01/01/2020  Referring provider: Minette Brine, FNP Primary care provider: Minette Brine, FNP  Chief Complaint: Urticaria and Rash  History of Present Illness: I had the pleasure of seeing Linda Allen for initial evaluation at the [facility] on 01/01/2020. She is a 74 y.o. female, who is self-referred here for the evaluation of rash.  Rash started about 2 months ago and it started on her arms initially but now it spread throughout her body. Describes them as itchy, burning, painful, erythematous, raised at times. Some also had pustule like rashes. This rash only cleared up with prednisone. No ecchymosis upon resolution. Associated symptoms include: none. Suspected triggers are unknown. Denies any fevers, chills, changes in medications, foods, personal care products or recent infections. She has tried the following therapies: benadryl, cortisone cream with no benefit. Systemic steroids yes. Currently on no medications.  Previous work up includes: dermatology - skin biopsy done. Patient has a dermatology specialist appointment at [facility] in February 2022.  Patient had recent tick bite. Does not consume red meat but does drink dairy daily.  Previous history of rash/hives: 2 years ago had hives on her antecubital fossa which resolved with cold compresses. Patient is up to date with the following cancer screening tests: physical exam, mammogram, colonoscopy, pap smears.  Patient had right and left knee replacements in 2012, then left hip replacement in June 2021 and then left shoulder replacement in September 2021. Sometimes she has issues with not gold earrings in the form of itching in the past.   She mentions that she developed milder rash on her left hip right after the hip replacement was done but did not think much of it. Otherwise all her joints are doing well and healed  well after the surgery.   No prior COVID-19 diagnosis. Up to date with COVID-19 vaccination including booster.   No one else has this rash at home.   11/14/2019 skin biopsy: "There is a superficial perivascular inflammatory infiltrate composed predominantly of lymphocytes. Lymphocytes extend to the epidermis where there is spongiosis and overlying parakeratosis. Following review of the hematoxylin and eosin sections, a PAS stain was obtained to exclude a fungal infection. The PAS stain is negative for fungal organisms. The findings are most consistent with an eczematous dermatitis such as contact or nummular dermatitis.  Specimen Gross and Clinical Information Clinical Diagnosis Urticarial dermatitis 30% BSA, R/O eczema, non-vesicular pemphigoid, contact dermatitis"  11/07/2019 UC visit: "Patient is a 74 year old female who presents today with a rash.  This is been present and worsening past week.  The rash is extremely itchy and feels like "bugs are crawling".  She has been using Benadryl without any relief.   Denies any fever, joint pain. Denies any recent changes in lotions, detergents, foods or other possible irritants. No recent travel. Nobody else at home has the rash. Patient has been outside but denies any contact with plants or insects. No new foods or medications.  Rash Not sure if this is another allergic reaction versus scabies versus extremely dry skin Steroid injection given here for symptoms We will go ahead and cover for scabies with permethrin cream with instructions on how to use Hydroxyzine for itching as needed.  Recommend keeping skin moisturized with Eucerin cream Wash all bedding in hot water Follow up as needed for continued or worsening symptoms"  Assessment and Plan: Asa is a 74 y.o. female  with: Rash and other nonspecific skin eruption 2 months history of whole body pruritic rash. Only completely resolved after prednisone. Now using benadryl and topical  cortisone with minimal benefit. Skin biopsy by dermatology showed "consistent with eczematous dermatitis such as contact or nummular dermatitis." No prior history of rash except for breaking out in hives 2 years ago. Only significant change in her medical history is that she had left hip replaced in June 2021 and developed a milder rash around the area in June and had left shoulder replaced in September 2021.   Discussed with patient that I'm not sure what's causing the rash.  Follow up with dermatology as scheduled in February.   Continue proper skin care.  . Get bloodwork as below to rule out other etiologies.  Body:  . Thick, stubborn areas (legs and feet): mometasone 0.1% ointment twice daily as needed to affected areas (up to 3 weeks in a row); stop when clear and can restart as needed for flares. . Moisturizer: Triamcinolone-Eucerin twice a day. . For more than twice a day use the following: Aquaphor, Vaseline, Cerave, Cetaphil, Eucerin, Vanicream.  Itching: . Take Zyrtec 10mg  in the morning . Take hydroxyzine 10mg  to 20mg  1 hour before bed - this may cause drowsiness  If all work up comes back inconclusive, patient may benefit from patch testing to metals to see if she developed sensitivity to the hardware that was placed this year. However, the fact that her joint replacements have been healing well without any issues makes this low on the differential diagnosis.   Return in about 3 months (around 03/31/2020).  Meds ordered this encounter  Medications  . mometasone (ELOCON) 0.1 % ointment    Sig: Apply topically 2 (two) times daily as needed. Do not use on the face, neck, armpits or groin area. Do not use more than 3 weeks in a row. Stop when clear and can restart as needed for flares.    Dispense:  90 g    Refill:  2  . hydrOXYzine (ATARAX/VISTARIL) 10 MG tablet    Sig: 1-2 tablets 1 hour before bedtime as needed for itching    Dispense:  60 tablet    Refill:  2  . triamcinolone  ointment (KENALOG) 0.1 %    Sig: Apply 1 application topically 2 (two) times daily. Use as a moisturizer all over your body.    Dispense:  453 g    Refill:  2    Mix 1:1 with Eucerin    Lab Orders     CBC with Differential/Platelet     ANA w/Reflex     Chronic Urticaria     Comprehensive metabolic panel     C-reactive protein     Tryptase     Thyroid Cascade Profile     IgE Food Basic w/Component Rfx     Sedimentation rate     Alpha-Gal Panel  Other allergy screening: Asthma: no Rhino conjunctivitis: no  Diagnosed with environmental allergies in her 14s but now asymptomatic.   Food allergy: no  Dietary History: patient has been eating other foods including milk, eggs, peanut, treenuts, sesame, shellfish, fish, soy, wheat, meats - chicken, fruits and vegetables. Medication allergy: yes  Penicillin - rash, hives Hymenoptera allergy: yes  Shortness of breath, swelling History of recurrent infections suggestive of immunodeficency: no  Diagnostics: None.   Past Medical History: Patient Active Problem List   Diagnosis Date Noted  . Rash and other nonspecific skin eruption 01/01/2020  .  Pruritus 01/01/2020  . Left hip OA 07/09/2019  . S/P left THA, AA 07/09/2019  . S/P hip replacement, left 07/09/2019  . Arthritis L hip, R shoulder, both knees   . MORBID OBESITY 01/12/2010  . Essential hypertension 12/22/2008  . MURMUR 12/22/2008   Past Medical History:  Diagnosis Date  . Allergy   . Angio-edema   . Arthritis    L hip  . Atypical mole 01/21/2014   SEBACECIOUS ATYPIA RIGHT SCALP = MOHS  . Heart murmur   . Hypertension   . Scleroderma ([place])   . Urticaria    Past Surgical History: Past Surgical History:  Procedure Laterality Date  . ADENOIDECTOMY    . ENDOVENOUS ABLATION SAPHENOUS VEIN W/ LASER Left 09/26/2018   endovenous laser ablation left greater saphenous vein by Ruta Hinds MD   . ENDOVENOUS ABLATION SAPHENOUS VEIN W/ LASER Right 10/17/2018    endovenous laser ablation right greater saphenous vein by Ruta Hinds MD   . JOINT REPLACEMENT  2011, 2012   both knees  . REVERSE SHOULDER ARTHROPLASTY Left 09/20/2019   Procedure: REVERSE SHOULDER ARTHROPLASTY;  Surgeon: Netta Cedars, MD;  Location: [facility];  Service: Orthopedics;  Laterality: Left;  . SHOULDER ACROMIOPLASTY Right 2012  . TONSILLECTOMY    . TOTAL HIP ARTHROPLASTY Left 07/09/2019   Procedure: TOTAL HIP ARTHROPLASTY ANTERIOR APPROACH;  Surgeon: Paralee Cancel, MD;  Location: [facility];  Service: Orthopedics;  Laterality: Left;  70 mins   Medication List:  Current Outpatient Medications  Medication Sig Dispense Refill  . lisinopril (ZESTRIL) 10 MG tablet Take 1 tablet (10 mg total) by mouth daily. 30 tablet 11  . hydrOXYzine (ATARAX/VISTARIL) 10 MG tablet 1-2 tablets 1 hour before bedtime as needed for itching 60 tablet 2  . mometasone (ELOCON) 0.1 % ointment Apply topically 2 (two) times daily as needed. Do not use on the face, neck, armpits or groin area. Do not use more than 3 weeks in a row. Stop when clear and can restart as needed for flares. 90 g 2  . triamcinolone ointment (KENALOG) 0.1 % Apply 1 application topically 2 (two) times daily. Use as a moisturizer all over your body. 453 g 2   No current facility-administered medications for this visit.   Allergies: Allergies  Allergen Reactions  . Bee Venom Shortness Of Breath  . Ivp Dye [Iodinated Diagnostic Agents] Shortness Of Breath and Rash  . Penicillins Hives and Rash    Tolerated Cephalosporin Date: 07/09/19.     Social History: Social History   Socioeconomic History  . Marital status: Married    Spouse name: Not on file  . Number of children: 2  . Years of education: Not on file  . Highest education level: Not on file  Occupational History  . Occupation: retired  Tobacco Use  . Smoking status: Former Smoker    Years: 10.00    Quit date: 12/31/1980    Years since quitting: 39.0  . Smokeless  tobacco: Never Used  . Tobacco comment: smoked for 10 yeas, d/c 40 y ago  Vaping Use  . Vaping Use: Never used  Substance and Sexual Activity  . Alcohol use: Yes    Comment: occas.  . Drug use: Never  . Sexual activity: Yes    Birth control/protection: None    Comment: number of sex partners in the last 63 months  1  Other Topics Concern  . Not on file  Social History Narrative   Exercise walking 3 times per  week for 30 minutes.      Anticipatory guidance - has living will, full code, but would not want to be on ventilator for prolonged time. organ donor.             Social Determinants of Health   Financial Resource Strain: Low Risk   . Difficulty of Paying Living Expenses: Not hard at all  Food Insecurity: No Food Insecurity  . Worried About Charity fundraiser in the Last Year: Never true  . Ran Out of Food in the Last Year: Never true  Transportation Needs: No Transportation Needs  . Lack of Transportation (Medical): No  . Lack of Transportation (Non-Medical): No  Physical Activity: Inactive  . Days of Exercise per Week: 0 days  . Minutes of Exercise per Session: 0 min  Stress: No Stress Concern Present  . Feeling of Stress : Only a little  Social Connections: Not on file   Lives in a [place]. Smoking: denies Occupation: retired  Programme researcher, broadcasting/film/video HistoryFreight forwarder in the house: no Charity fundraiser in the family room: no Carpet in the bedroom: no Heating: gas Cooling: central Pet: yes 2 dogs x 12 yrs, 1 yr  Family History: Family History  Problem Relation Age of Onset  . CVA Mother   . Parkinson's disease Father   . CVA Father   . Heart attack Father   . Eczema Sister   . Ulcerative colitis Brother   . Heart failure Paternal Grandmother    Problem                               Relation Asthma                                   No  Eczema                                No  Food allergy                          No  Allergic rhino conjunctivitis      No   Review of Systems  Constitutional: Negative for appetite change, chills, fever and unexpected weight change.  HENT: Negative for congestion and rhinorrhea.   Eyes: Negative for itching.  Respiratory: Negative for cough, chest tightness, shortness of breath and wheezing.   Cardiovascular: Negative for chest pain.  Gastrointestinal: Negative for abdominal pain.  Genitourinary: Negative for difficulty urinating.  Skin: Positive for rash.  Neurological: Negative for headaches.   Objective: BP (!) 142/76 (BP Location: Right Arm, Patient Position: Sitting, Cuff Size: Large)   Pulse 90   Temp 97.6 F (36.4 C) (Temporal)   Resp 14   Ht 5' 8.5" (1.74 m)   Wt 271 lb (122.9 kg)   SpO2 100%   BMI 40.61 kg/m  Body mass index is 40.61 kg/m. Physical Exam Vitals and nursing note reviewed.  Constitutional:      Appearance: Normal appearance. She is well-developed.  HENT:     Head: Normocephalic and atraumatic.     Right Ear: External ear normal.     Left Ear: External ear normal.     Nose: Nose normal.     Mouth/Throat:     Mouth: Mucous membranes are  moist.     Pharynx: Oropharynx is clear.  Eyes:     Conjunctiva/sclera: Conjunctivae normal.  Cardiovascular:     Rate and Rhythm: Normal rate and regular rhythm.     Heart sounds: Murmur heard.  No friction rub. No gallop.   Pulmonary:     Effort: Pulmonary effort is normal.     Breath sounds: Normal breath sounds. No wheezing, rhonchi or rales.  Musculoskeletal:     Cervical back: Neck supple.  Skin:    General: Skin is warm and dry.     Findings: Rash present.     Comments: Dry skin throughout with large erythematous areas of slightly raised rashes on upper extremities b/l, anterior and posterior torso. Lower extremities have fewer areas. There is a patch on the left hip around the surgical scar as well.   Neurological:     Mental Status: She is alert and oriented to person, place, and time.  Psychiatric:         Behavior: Behavior normal.    The plan was reviewed with the patient/family, and all questions/concerned were addressed.  It was my pleasure to see Rajean today and participate in her care. Please feel free to contact me with any questions or concerns.  Sincerely,  Rexene Alberts, DO Allergy & Immunology  [facility] office: [place] office: [phone]

## 2020-01-01 ENCOUNTER — Ambulatory Visit: Payer: Medicare PPO | Admitting: Allergy

## 2020-01-01 ENCOUNTER — Other Ambulatory Visit: Payer: Self-pay

## 2020-01-01 ENCOUNTER — Encounter: Payer: Self-pay | Admitting: Allergy

## 2020-01-01 VITALS — BP 142/76 | HR 90 | Temp 97.6°F | Resp 14 | Ht 68.5 in | Wt 271.0 lb

## 2020-01-01 DIAGNOSIS — L299 Pruritus, unspecified: Secondary | ICD-10-CM | POA: Diagnosis not present

## 2020-01-01 DIAGNOSIS — R21 Rash and other nonspecific skin eruption: Secondary | ICD-10-CM | POA: Diagnosis not present

## 2020-01-01 MED ORDER — MOMETASONE FUROATE 0.1 % EX OINT: TOPICAL_OINTMENT | Freq: Two times a day (BID) | CUTANEOUS | 2 refills | Status: DC | PRN

## 2020-01-01 MED ORDER — HYDROXYZINE HCL 10 MG PO TABS: ORAL_TABLET | ORAL | 2 refills | Status: DC

## 2020-01-01 MED ORDER — TRIAMCINOLONE ACETONIDE 0.1 % EX OINT: 1.0000 "application " | TOPICAL_OINTMENT | Freq: Two times a day (BID) | CUTANEOUS | 2 refills | Status: DC

## 2020-01-01 NOTE — Patient Instructions (Addendum)
Rash:  Not sure what's causing your rash.  Make sure you follow up with your dermatologist.  . Get bloodwork.  o We are ordering labs, so please allow 1-2 weeks for the results to come back. o With the newly implemented Cures Act, the labs might be visible to you at the same time that they become visible to me. However, I will not address the results until all of the results are back, so please be patient.   Body:  . Thick, stubborn areas (legs and feet): mometasone 0.1% ointment twice daily as needed to affected areas (up to 3 weeks in a row); stop when clear and can restart as needed for flares. . Moisturizer: Triamcinolone-Eucerin twice a day. . For more than twice a day use the following: Aquaphor, Vaseline, Cerave, Cetaphil, Eucerin, Vanicream.  Itching: . Take Zyrtec 10mg  in the morning . Take hydroxyzine 10mg  to 20mg  1 hour before bed - this may cause drowsness  Follow up in 3 months - after your dermatology appointment.    Skin care recommendations  Bath time: . Always use lukewarm water. AVOID very hot or cold water. Marland Kitchen Keep bathing time to 5-10 minutes. . Do NOT use bubble bath. . Use a mild soap and use just enough to wash the dirty areas. . Do NOT scrub skin vigorously.  . After bathing, pat dry your skin with a towel. Do NOT rub or scrub the skin.  Moisturizers and prescriptions:  . ALWAYS apply moisturizers immediately after bathing (within 3 minutes). This helps to lock-in moisture. . Use the moisturizer several times a day over the whole body. Kermit Balo summer moisturizers include: Aveeno, CeraVe, Cetaphil. Kermit Balo winter moisturizers include: Aquaphor, Vaseline, Cerave, Cetaphil, Eucerin, Vanicream. . When using moisturizers along with medications, the moisturizer should be applied about one hour after applying the medication to prevent diluting effect of the medication or moisturize around where you applied the medications. When not using medications, the moisturizer  can be continued twice daily as maintenance.  Laundry and clothing: . Avoid laundry products with added color or perfumes. . Use unscented hypo-allergenic laundry products such as Tide free, Cheer free & gentle, and All free and clear.  . If the skin still seems dry or sensitive, you can try double-rinsing the clothes. . Avoid tight or scratchy clothing such as wool. . Do not use fabric softeners or dyer sheets.

## 2020-01-01 NOTE — Assessment & Plan Note (Addendum)
2 months history of whole body pruritic rash. Only completely resolved after prednisone. Now using benadryl and topical cortisone with minimal benefit. Skin biopsy by dermatology showed "consistent with eczematous dermatitis such as contact or nummular dermatitis." No prior history of rash except for breaking out in hives 2 years ago. Only significant change in her medical history is that she had left hip replaced in June 2021 and developed a milder rash around the area in June and had left shoulder replaced in September 2021.   Discussed with patient that I'm not sure what's causing the rash.  Follow up with dermatology as scheduled in February.   Continue proper skin care.  . Get bloodwork as below to rule out other etiologies.  Body:  . Thick, stubborn areas (legs and feet): mometasone 0.1% ointment twice daily as needed to affected areas (up to 3 weeks in a row); stop when clear and can restart as needed for flares. . Moisturizer: Triamcinolone-Eucerin twice a day. . For more than twice a day use the following: Aquaphor, Vaseline, Cerave, Cetaphil, Eucerin, Vanicream.  Itching: . Take Zyrtec 10mg  in the morning . Take hydroxyzine 10mg  to 20mg  1 hour before bed - this may cause drowsiness  If all work up comes back inconclusive, patient may benefit from patch testing to metals to see if she developed sensitivity to the hardware that was placed this year. However, the fact that her joint replacements have been healing well without any issues makes this low on the differential diagnosis.

## 2020-01-05 MED ORDER — TETANUS-DIPHTH-ACELL PERTUSSIS 5-2.5-18.5 LF-MCG/0.5 IM SUSP: 0.5000 mL | Freq: Once | INTRAMUSCULAR | 0 refills | Status: AC

## 2020-01-10 LAB — IGE FOOD BASIC W/COMPONENT RFX
Codfish IgE: <0.1 kU/L
F001-IgE Egg White: <0.1 kU/L
F002-IgE Milk: <0.1 kU/L
Peanut, IgE: <0.1 kU/L
Soybean IgE: <0.1 kU/L
Wheat IgE: <0.1 kU/L

## 2020-01-10 LAB — COMPREHENSIVE METABOLIC PANEL
ALT: 15 IU/L (ref 0–32)
AST: 19 IU/L (ref 0–40)
Albumin/Globulin Ratio: 1.3 (ref 1.2–2.2)
Albumin: 3.9 g/dL (ref 3.7–4.7)
Alkaline Phosphatase: 79 IU/L (ref 44–121)
BUN/Creatinine Ratio: 24 (ref 12–28)
BUN: 24 mg/dL (ref 8–27)
Bilirubin Total: 0.3 mg/dL (ref 0.0–1.2)
CO2: 25 mmol/L (ref 20–29)
Calcium: 9.5 mg/dL (ref 8.7–10.3)
Chloride: 102 mmol/L (ref 96–106)
Creatinine, Ser: 1.02 mg/dL — ABNORMAL HIGH (ref 0.57–1.00)
GFR calc Af Amer: 64 mL/min/{1.73_m2} (ref >59)
GFR calc non Af Amer: 55 mL/min/{1.73_m2} — ABNORMAL LOW (ref >59)
Globulin, Total: 3.1 g/dL (ref 1.5–4.5)
Glucose: 81 mg/dL (ref 65–99)
Potassium: 5 mmol/L (ref 3.5–5.2)
Sodium: 140 mmol/L (ref 134–144)
Total Protein: 7 g/dL (ref 6.0–8.5)

## 2020-01-10 LAB — CBC WITH DIFFERENTIAL/PLATELET
Basophils Absolute: 0 10*3/uL (ref 0.0–0.2)
Basos: 1 %
EOS (ABSOLUTE): 0.4 10*3/uL (ref 0.0–0.4)
Eos: 7 %
Hematocrit: 38.8 % (ref 34.0–46.6)
Hemoglobin: 12.6 g/dL (ref 11.1–15.9)
Immature Grans (Abs): 0 10*3/uL (ref 0.0–0.1)
Immature Granulocytes: 0 %
Lymphocytes Absolute: 1.6 10*3/uL (ref 0.7–3.1)
Lymphs: 27 %
MCH: 31.3 pg (ref 26.6–33.0)
MCHC: 32.5 g/dL (ref 31.5–35.7)
MCV: 97 fL (ref 79–97)
Monocytes Absolute: 0.6 10*3/uL (ref 0.1–0.9)
Monocytes: 10 %
Neutrophils Absolute: 3.4 10*3/uL (ref 1.4–7.0)
Neutrophils: 55 %
Platelets: 368 10*3/uL (ref 150–450)
RBC: 4.02 x10E6/uL (ref 3.77–5.28)
RDW: 15.8 % — ABNORMAL HIGH (ref 11.7–15.4)
WBC: 6.1 10*3/uL (ref 3.4–10.8)

## 2020-01-10 LAB — ALPHA-GAL PANEL
Allergen Lamb IgE: <0.1 kU/L
Beef IgE: <0.1 kU/L
IgE (Immunoglobulin E), Serum: 57 IU/mL (ref 6–495)
O215-IgE Alpha-Gal: <0.1 kU/L
Pork IgE: <0.1 kU/L

## 2020-01-10 LAB — ANA W/REFLEX: Anti Nuclear Antibody (ANA): NEGATIVE

## 2020-01-10 LAB — TRYPTASE: Tryptase: 6.1 ug/L (ref 2.2–13.2)

## 2020-01-10 LAB — THYROID CASCADE PROFILE: TSH: 3.11 u[IU]/mL (ref 0.450–4.500)

## 2020-01-10 LAB — C-REACTIVE PROTEIN: CRP: 2 mg/L (ref 0–10)

## 2020-01-10 LAB — CHRONIC URTICARIA: cu index: <2.6 (ref <10)

## 2020-01-10 LAB — SEDIMENTATION RATE: Sed Rate: 31 mm/hr (ref 0–40)

## 2020-01-15 ENCOUNTER — Other Ambulatory Visit: Payer: Self-pay

## 2020-01-15 ENCOUNTER — Encounter: Payer: Self-pay | Admitting: Nurse Practitioner

## 2020-01-15 ENCOUNTER — Ambulatory Visit (INDEPENDENT_AMBULATORY_CARE_PROVIDER_SITE_OTHER): Payer: Medicare PPO | Admitting: Nurse Practitioner

## 2020-01-15 VITALS — BP 150/94 | HR 62 | Temp 98.1°F | Ht 65.0 in | Wt 272.0 lb

## 2020-01-15 DIAGNOSIS — I10 Essential (primary) hypertension: Secondary | ICD-10-CM

## 2020-01-15 DIAGNOSIS — R011 Cardiac murmur, unspecified: Secondary | ICD-10-CM | POA: Diagnosis not present

## 2020-01-15 MED ORDER — LISINOPRIL 20 MG PO TABS: 20.0000 mg | ORAL_TABLET | Freq: Every day | ORAL | 1 refills | Status: DC

## 2020-01-15 NOTE — Patient Instructions (Signed)

## 2020-01-15 NOTE — Progress Notes (Signed)
Rutherford Nail as a scribe for Minette Brine, FNP.,have documented all relevant documentation on the behalf of Minette Brine, FNP,as directed by  Minette Brine, FNP while in the presence of Minette Brine, [place]. This visit occurred during the SARS-CoV-2 public health emergency.  Safety protocols were in place, including screening questions prior to the visit, additional usage of staff PPE, and extensive cleaning of exam room while observing appropriate contact time as indicated for disinfecting solutions.  Subjective:     Patient ID: Linda Allen , female    DOB: 1945/03/22 , 75 y.o.   MRN: [id number]   Chief Complaint  Patient presents with  . Hypertension    HPI  Patient here for a blood pressure f/u.  She is taking lisinopril 10 mg daily without any side effects.  Denies headaches, blurred vision and chest pain. She drank caffienated. She reports drinking approximately 56 oz of water a day.  Denies eating high amount of foods with salt     Past Medical History:  Diagnosis Date  . Allergy   . Angio-edema   . Arthritis    L hip  . Atypical mole 01/21/2014   SEBACECIOUS ATYPIA RIGHT SCALP = MOHS  . Heart murmur   . Hypertension   . Scleroderma ([place])   . Urticaria      Family History  Problem Relation Age of Onset  . CVA Mother   . Parkinson's disease Father   . CVA Father   . Heart attack Father   . Eczema Sister   . Ulcerative colitis Brother   . Heart failure Paternal Grandmother      Current Outpatient Medications:  .  lisinopril (ZESTRIL) 20 MG tablet, Take 1 tablet (20 mg total) by mouth daily., Disp: 90 tablet, Rfl: 1   Allergies  Allergen Reactions  . Bee Venom Shortness Of Breath  . Ivp Dye [Iodinated Diagnostic Agents] Shortness Of Breath and Rash  . Penicillins Hives and Rash    Tolerated Cephalosporin Date: 07/09/19.       Review of Systems  Constitutional: Negative.  Negative for fatigue.  HENT: Negative.   Eyes: Negative.    Respiratory: Negative.   Cardiovascular: Negative.  Negative for chest pain, palpitations and leg swelling.  Gastrointestinal: Negative.   Endocrine: Negative for polydipsia, polyphagia and polyuria.  Genitourinary: Negative.   Musculoskeletal: Negative.   Skin: Negative.   Neurological: Negative for dizziness and headaches.  Hematological: Negative.   Psychiatric/Behavioral: Negative.      Today's Vitals   01/15/20 1154  BP: (!) 150/94  Pulse: 62  Temp: 98.1 F (36.7 C)  Weight: 272 lb (123.4 kg)  Height: 5\' 5"  (1.651 m)  PainSc: 0-No pain   Body mass index is 45.26 kg/m.   Objective:  Physical Exam Constitutional:      General: She is not in acute distress.    Appearance: Normal appearance. She is obese.  Cardiovascular:     Rate and Rhythm: Rhythm irregular.     Heart sounds: Murmur (seems louder today 3/6 - able to hear this murmur posterior as well) heard.    Pulmonary:     Effort: Pulmonary effort is normal. No respiratory distress.     Breath sounds: Normal breath sounds. No wheezing.  Neurological:     General: No focal deficit present.     Mental Status: She is alert.  Psychiatric:        Mood and Affect: Mood normal.        Behavior:  Behavior normal.        Thought Content: Thought content normal.        Judgment: Judgment normal.         Assessment And Plan:     1. Essential hypertension  Blood pressure remains elevated, will increase her lisinopril to 20 mg daily she will come in 2 weeks for blood pressure check nurse visit.   She is encouraged to continue with a low salt diet  She is encouraged to bring her blood pressure cuff to the office to see if she is checking correctly - lisinopril (ZESTRIL) 20 MG tablet; Take 1 tablet (20 mg total) by mouth daily.  Dispense: 90 tablet; Refill: 1  2. MURMUR  Able to hear 3/6 murmur posteriorly as well  She is being followed by Cardiology  She has not went to get her Tdap at this time due to the  persistent itching she had   Patient was given opportunity to ask questions. Patient verbalized understanding of the plan and was able to repeat key elements of the plan. All questions were answered to their satisfaction.  Minette Brine, FNP   I, Minette Brine, FNP, have reviewed all documentation for this visit. The documentation on 01/15/20 for the exam, diagnosis, procedures, and orders are all accurate and complete.   THE PATIENT IS ENCOURAGED TO PRACTICE SOCIAL DISTANCING DUE TO THE COVID-19 PANDEMIC.

## 2020-01-28 ENCOUNTER — Ambulatory Visit: Payer: Medicare PPO

## 2020-02-17 ENCOUNTER — Ambulatory Visit: Payer: Medicare PPO | Admitting: Nurse Practitioner

## 2020-03-17 ENCOUNTER — Ambulatory Visit (INDEPENDENT_AMBULATORY_CARE_PROVIDER_SITE_OTHER): Payer: Medicare PPO | Admitting: Nurse Practitioner

## 2020-03-17 ENCOUNTER — Encounter: Payer: Self-pay | Admitting: Nurse Practitioner

## 2020-03-17 ENCOUNTER — Other Ambulatory Visit: Payer: Self-pay

## 2020-03-17 VITALS — BP 180/90 | HR 93 | Temp 97.3°F | Ht 64.2 in | Wt 268.4 lb

## 2020-03-17 DIAGNOSIS — R011 Cardiac murmur, unspecified: Secondary | ICD-10-CM | POA: Diagnosis not present

## 2020-03-17 DIAGNOSIS — I10 Essential (primary) hypertension: Secondary | ICD-10-CM | POA: Diagnosis not present

## 2020-03-17 MED ORDER — AMLODIPINE BESYLATE 5 MG PO TABS: 5.0000 mg | ORAL_TABLET | Freq: Every day | ORAL | 2 refills | Status: DC

## 2020-03-17 NOTE — Progress Notes (Signed)
I,Tianna Badgett,acting as a Education administrator for [employer], FNP.,have documented all relevant documentation on the behalf of Minette Brine, FNP,as directed by  Minette Brine, FNP while in the presence of Minette Brine, [place].  This visit occurred during the SARS-CoV-2 public health emergency.  Safety protocols were in place, including screening questions prior to the visit, additional usage of staff PPE, and extensive cleaning of exam room while observing appropriate contact time as indicated for disinfecting solutions.  Subjective:     Patient ID: Linda Allen , female    DOB: 02/06/1945 , 75 y.o.   MRN: [id number]   Chief Complaint  Patient presents with  . Hypertension    HPI  Patient here for a blood pressure f/u.  She is taking lisinopril 10 mg daily without any side effects.  Denies headaches, blurred vision and chest pain. She drank caffienated. She reports drinking approximately 56 oz of water a day.  Denies eating high amount of foods with salt   Wt Readings from Last 3 Encounters: 03/17/20 : 268 lb 6.4 oz (121.7 kg) 01/15/20 : 272 lb (123.4 kg) 01/01/20 : 271 lb (122.9 kg)  Hypertension This is a chronic problem. The current episode started more than 1 year ago. The problem is uncontrolled. Pertinent negatives include no anxiety, blurred vision, chest pain, headaches, peripheral edema or shortness of breath. There are no associated agents to hypertension. Risk factors for coronary artery disease include obesity, sedentary lifestyle and post-menopausal state. Past treatments include nothing. There are no compliance problems.  There is no history of angina, kidney disease or CVA. There is no history of chronic renal disease.     Past Medical History:  Diagnosis Date  . Allergy   . Angio-edema   . Arthritis    L hip  . Atypical mole 01/21/2014   SEBACECIOUS ATYPIA RIGHT SCALP = MOHS  . Heart murmur   . Hypertension   . Scleroderma ([place])   . Urticaria      Family History   Problem Relation Age of Onset  . CVA Mother   . Parkinson's disease Father   . CVA Father   . Heart attack Father   . Eczema Sister   . Ulcerative colitis Brother   . Heart failure Paternal Grandmother      Current Outpatient Medications:  .  amLODipine (NORVASC) 5 MG tablet, Take 1 tablet (5 mg total) by mouth daily., Disp: 30 tablet, Rfl: 2 .  lisinopril (ZESTRIL) 20 MG tablet, Take 1 tablet (20 mg total) by mouth daily., Disp: 90 tablet, Rfl: 1   Allergies  Allergen Reactions  . Bee Venom Shortness Of Breath  . Ivp Dye [Iodinated Diagnostic Agents] Shortness Of Breath and Rash  . Penicillins Hives and Rash    Tolerated Cephalosporin Date: 07/09/19.       Review of Systems  Constitutional: Negative.   Eyes: Negative for blurred vision.  Respiratory: Negative.  Negative for shortness of breath.   Cardiovascular: Negative.  Negative for chest pain.  Gastrointestinal: Negative.   Neurological: Negative.  Negative for headaches.     Today's Vitals   03/17/20 1128  BP: (!) 170/102  Pulse: 93  Temp: (!) 97.3 F (36.3 C)  TempSrc: Oral  Weight: 268 lb 6.4 oz (121.7 kg)  Height: 5' 4.2" (1.631 m)  PainSc: 0-No pain   Body mass index is 45.78 kg/m.   Objective:  Physical Exam Constitutional:      General: She is not in acute distress.  Appearance: Normal appearance. She is obese.  Cardiovascular:     Rate and Rhythm: Normal rate and regular rhythm.     Pulses: Normal pulses.     Heart sounds: Murmur (3/6) heard.    Pulmonary:     Effort: Pulmonary effort is normal.     Breath sounds: Normal breath sounds.  Abdominal:     General: Abdomen is flat. Bowel sounds are normal.     Palpations: Abdomen is soft.  Musculoskeletal:        General: Normal range of motion.     Cervical back: Normal range of motion and neck supple.  Skin:    General: Skin is warm and dry.     Capillary Refill: Capillary refill takes less than 2 seconds.  Neurological:      General: No focal deficit present.     Mental Status: She is alert and oriented to person, place, and time.  Psychiatric:        Mood and Affect: Mood normal.        Behavior: Behavior normal.        Thought Content: Thought content normal.        Judgment: Judgment normal.         Assessment And Plan:     1. Uncontrolled hypertension  Blood pressure continues to be elevated, repeat done was 180/90. I am adding amlodipine 5 mg. She will return in 2 weeks for nurse visit blood pressure check. She is also to call Dr. Marisue Ivan to schedule an appt. I had a discussion with her about having a sleep study done and she is resistant to sleeping in an unknown area.  I have recommended she discuss further with Dr. Audie Box as he may some options.  - amLODipine (NORVASC) 5 MG tablet; Take 1 tablet (5 mg total) by mouth daily.  Dispense: 30 tablet; Refill: 2 - BMP8+eGFR  2. MURMUR  Sounds louder than previous visit 3/6  She is to follow up with Dr. Marisue Ivan within the next month    Patient was given opportunity to ask questions. Patient verbalized understanding of the plan and was able to repeat key elements of the plan. All questions were answered to their satisfaction.  Minette Brine, FNP   I, Minette Brine, FNP, have reviewed all documentation for this visit. The documentation on 03/17/20 for the exam, diagnosis, procedures, and orders are all accurate and complete.   THE PATIENT IS ENCOURAGED TO PRACTICE SOCIAL DISTANCING DUE TO THE COVID-19 PANDEMIC.

## 2020-03-17 NOTE — Patient Instructions (Signed)

## 2020-03-18 LAB — BMP8+EGFR
BUN/Creatinine Ratio: 27 (ref 12–28)
BUN: 26 mg/dL (ref 8–27)
CO2: 22 mmol/L (ref 20–29)
Calcium: 10.1 mg/dL (ref 8.7–10.3)
Chloride: 101 mmol/L (ref 96–106)
Creatinine, Ser: 0.97 mg/dL (ref 0.57–1.00)
Glucose: 90 mg/dL (ref 65–99)
Potassium: 4.6 mmol/L (ref 3.5–5.2)
Sodium: 139 mmol/L (ref 134–144)
eGFR: 62 mL/min/{1.73_m2} (ref >59)

## 2020-03-19 ENCOUNTER — Telehealth: Payer: Self-pay | Admitting: Allergy

## 2020-03-19 NOTE — Telephone Encounter (Signed)
Patient called and said that she was seen by the doctor in [place]. And she does not have any news to report to you.

## 2020-03-19 NOTE — Telephone Encounter (Signed)
Noted  

## 2020-03-31 ENCOUNTER — Other Ambulatory Visit: Payer: Self-pay

## 2020-03-31 ENCOUNTER — Ambulatory Visit: Payer: Medicare PPO

## 2020-03-31 VITALS — BP 138/90 | HR 77 | Temp 97.8°F | Ht 64.0 in | Wt 264.4 lb

## 2020-03-31 DIAGNOSIS — I10 Essential (primary) hypertension: Secondary | ICD-10-CM

## 2020-03-31 NOTE — Progress Notes (Signed)
Pt is here for a BPC. She had an orange for breakfast. Drinks plenty of eater. Stays away from salty foods and she also just had her hip replaced so she is slowly but surely exercising. Provider stated for her to continue her good habits, bp was under control. Pt has a physical scheduled in June.   BP Readings from Last 3 Encounters:  03/31/20 138/90  03/17/20 (!) 180/90  01/15/20 (!) 150/94

## 2020-04-08 ENCOUNTER — Ambulatory Visit: Payer: Medicare PPO | Admitting: Allergy

## 2020-04-26 ENCOUNTER — Other Ambulatory Visit: Payer: Self-pay | Admitting: Nurse Practitioner

## 2020-04-26 DIAGNOSIS — I10 Essential (primary) hypertension: Secondary | ICD-10-CM

## 2020-05-12 ENCOUNTER — Encounter: Payer: Self-pay | Admitting: Nurse Practitioner

## 2020-05-12 ENCOUNTER — Ambulatory Visit (INDEPENDENT_AMBULATORY_CARE_PROVIDER_SITE_OTHER): Payer: Medicare PPO | Admitting: Nurse Practitioner

## 2020-05-12 ENCOUNTER — Other Ambulatory Visit: Payer: Self-pay

## 2020-05-12 VITALS — BP 124/70 | HR 79 | Temp 97.9°F | Ht 67.0 in | Wt 262.4 lb

## 2020-05-12 DIAGNOSIS — R059 Cough, unspecified: Secondary | ICD-10-CM

## 2020-05-12 DIAGNOSIS — Z6841 Body Mass Index (BMI) 40.0 and over, adult: Secondary | ICD-10-CM

## 2020-05-12 DIAGNOSIS — Z8616 Personal history of COVID-19: Secondary | ICD-10-CM

## 2020-05-12 MED ORDER — AZITHROMYCIN 250 MG PO TABS: ORAL_TABLET | ORAL | 0 refills | Status: AC

## 2020-05-12 MED ORDER — ALBUTEROL SULFATE HFA 108 (90 BASE) MCG/ACT IN AERS: 2.0000 | INHALATION_SPRAY | Freq: Four times a day (QID) | RESPIRATORY_TRACT | 2 refills | Status: DC | PRN

## 2020-05-12 NOTE — Progress Notes (Signed)
I,Yamilka Roman [employer] as a Education administrator for [employer], FNP.,have documented all relevant documentation on the behalf of Minette Brine, FNP,as directed by  Minette Brine, FNP while in the presence of Minette Brine, [place]. This visit occurred during the SARS-CoV-2 public health emergency.  Safety protocols were in place, including screening questions prior to the visit, additional usage of staff PPE, and extensive cleaning of exam room while observing appropriate contact time as indicated for disinfecting solutions.  Subjective:     Patient ID: Linda Allen , female    DOB: 1945/12/18 , 75 y.o.   MRN: [id number]   Chief Complaint  Patient presents with  . Cough    Patient stated she had covid on the 18th and then the following week she tested negative. She stated she has had a cough and some congestion,weak and fatigued.     HPI  Patient presents today for a cough and congestion with pale green color.  She also reports having weakness and fatigue. Denies shortness of breath.    Wt Readings from Last 3 Encounters: 05/12/20 : 262 lb 6.4 oz (119 kg) 03/31/20 : 264 lb 6.4 oz (119.9 kg) 03/17/20 : 268 lb 6.4 oz (121.7 kg)  Cough This is a recurrent problem. Pertinent negatives include no chest pain, chills, fever, headaches, sore throat, shortness of breath or wheezing. The symptoms are aggravated by other (laying down cough is worse). She has tried nothing for the symptoms. There is no history of asthma.     Past Medical History:  Diagnosis Date  . Allergy   . Angio-edema   . Arthritis    L hip  . Atypical mole 01/21/2014   SEBACECIOUS ATYPIA RIGHT SCALP = MOHS  . Heart murmur   . Hypertension   . Scleroderma ([place])   . Urticaria      Family History  Problem Relation Age of Onset  . CVA Mother   . Parkinson's disease Father   . CVA Father   . Heart attack Father   . Eczema Sister   . Ulcerative colitis Brother   . Heart failure Paternal Grandmother      Current  Outpatient Medications:  .  albuterol (VENTOLIN HFA) 108 (90 Base) MCG/ACT inhaler, Inhale 2 puffs into the lungs every 6 (six) hours as needed for wheezing or shortness of breath., Disp: 8 g, Rfl: 2 .  amLODipine (NORVASC) 5 MG tablet, TAKE 1 TABLET(5 MG) BY MOUTH DAILY, Disp: 30 tablet, Rfl: 2 .  budesonide-formoterol (SYMBICORT) 80-4.5 MCG/ACT inhaler, Inhale 2 puffs into the lungs 2 (two) times daily at 10 AM and 5 PM., Disp: 1 each, Rfl: 12 .  doxycycline (VIBRAMYCIN) 100 MG capsule, Take 1 capsule (100 mg total) by mouth 2 (two) times daily., Disp: 20 capsule, Rfl: 0 .  lisinopril (ZESTRIL) 20 MG tablet, TAKE 1 TABLET(20 MG) BY MOUTH DAILY, Disp: 90 tablet, Rfl: 1   Allergies  Allergen Reactions  . Bee Venom Shortness Of Breath  . Ivp Dye [Iodinated Diagnostic Agents] Shortness Of Breath and Rash  . Other Rash    Contrast dye  . Penicillins Hives and Rash    Tolerated Cephalosporin Date: 07/09/19.       Review of Systems  Constitutional: Negative for chills and fever.  HENT: Positive for congestion (chest congestion). Negative for sore throat.   Respiratory: Positive for cough (pale green sputum). Negative for choking, shortness of breath and wheezing.   Cardiovascular: Negative for chest pain, palpitations and leg swelling.  Neurological:  Negative for dizziness and headaches.  Psychiatric/Behavioral: Negative.      Today's Vitals   05/12/20 1442  BP: 124/70  Pulse: 79  Temp: 97.9 F (36.6 C)  TempSrc: Oral  SpO2: 95%  Weight: 262 lb 6.4 oz (119 kg)  Height: 5\' 7"  (1.702 m)  PainSc: 0-No pain   Body mass index is 41.1 kg/m.   Objective:  Physical Exam Vitals reviewed.  Constitutional:      General: She is not in acute distress.    Appearance: Normal appearance. She is obese.  Cardiovascular:     Rate and Rhythm: Normal rate and regular rhythm.     Pulses: Normal pulses.     Heart sounds: Normal heart sounds. No murmur heard.   Pulmonary:     Effort:  Pulmonary effort is normal. No respiratory distress.     Breath sounds: Wheezing (throughout) present.  Neurological:     General: No focal deficit present.     Mental Status: She is alert and oriented to person, place, and time.     Cranial Nerves: No cranial nerve deficit.     Motor: No weakness.  Psychiatric:        Mood and Affect: Mood normal.        Behavior: Behavior normal.        Thought Content: Thought content normal.        Judgment: Judgment normal.         Assessment And Plan:     1. Cough  She is to go to get an Xray  Treated with azithromycin and albuterol inhaler as needed  She has wheezing noted on physical exam - DG Chest 2 View; Future - azithromycin (ZITHROMAX) 250 MG tablet; Take 2 tablets (500 mg) on  Day 1,  followed by 1 tablet (250 mg) once daily on Days 2 through 5.  Dispense: 6 each; Refill: 0 - albuterol (VENTOLIN HFA) 108 (90 Base) MCG/ACT inhaler; Inhale 2 puffs into the lungs every 6 (six) hours as needed for wheezing or shortness of breath.  Dispense: 8 g; Refill: 2  2. History of COVID-19     Patient was given opportunity to ask questions. Patient verbalized understanding of the plan and was able to repeat key elements of the plan. All questions were answered to their satisfaction.  Minette Brine, FNP   I, Minette Brine, FNP, have reviewed all documentation for this visit. The documentation on 06/09/20 for the exam, diagnosis, procedures, and orders are all accurate and complete.   IF YOU HAVE BEEN REFERRED TO A SPECIALIST, IT MAY TAKE 1-2 WEEKS TO SCHEDULE/PROCESS THE REFERRAL. IF YOU HAVE NOT HEARD FROM US/SPECIALIST IN TWO WEEKS, PLEASE GIVE [place] A CALL AT [phone] X [phone].   THE PATIENT IS ENCOURAGED TO PRACTICE SOCIAL DISTANCING DUE TO THE COVID-19 PANDEMIC.

## 2020-05-13 ENCOUNTER — Ambulatory Visit
Admission: RE | Admit: 2020-05-13 | Discharge: 2020-05-13 | Disposition: A | Discharge status: Home / Self Care | Payer: Medicare PPO | Source: Ambulatory Visit | Attending: Nurse Practitioner | Admitting: Nurse Practitioner

## 2020-05-13 DIAGNOSIS — R059 Cough, unspecified: Secondary | ICD-10-CM

## 2020-05-13 DIAGNOSIS — R5383 Other fatigue: Secondary | ICD-10-CM | POA: Diagnosis not present

## 2020-05-16 ENCOUNTER — Other Ambulatory Visit: Payer: Self-pay | Admitting: Nurse Practitioner

## 2020-05-16 DIAGNOSIS — I10 Essential (primary) hypertension: Secondary | ICD-10-CM

## 2020-05-28 ENCOUNTER — Other Ambulatory Visit: Payer: Self-pay

## 2020-05-28 ENCOUNTER — Encounter: Payer: Self-pay | Admitting: Nurse Practitioner

## 2020-05-28 MED ORDER — BUDESONIDE-FORMOTEROL FUMARATE 80-4.5 MCG/ACT IN AERO: 2.0000 | INHALATION_SPRAY | Freq: Two times a day (BID) | RESPIRATORY_TRACT | 12 refills | Status: DC

## 2020-05-28 MED ORDER — DOXYCYCLINE HYCLATE 100 MG PO CAPS: 100.0000 mg | ORAL_CAPSULE | Freq: Two times a day (BID) | ORAL | 0 refills | Status: DC

## 2020-06-24 ENCOUNTER — Telehealth: Payer: Self-pay

## 2020-06-24 ENCOUNTER — Ambulatory Visit: Payer: Medicare PPO

## 2020-06-24 ENCOUNTER — Encounter: Payer: Medicare PPO | Admitting: Nurse Practitioner

## 2020-06-24 NOTE — Telephone Encounter (Signed)
This nurse called patient in regards to missed AWV for today. Message left to call back in order to reschedule for another time.

## 2020-07-18 ENCOUNTER — Ambulatory Visit (HOSPITAL_COMMUNITY)
Admission: EM | Admit: 2020-07-18 | Discharge: 2020-07-18 | Disposition: A | Discharge status: Home / Self Care | Payer: Medicare PPO | Attending: Urgent Care | Admitting: Urgent Care

## 2020-07-18 ENCOUNTER — Other Ambulatory Visit: Payer: Self-pay

## 2020-07-18 DIAGNOSIS — W548XXA Other contact with dog, initial encounter: Secondary | ICD-10-CM

## 2020-07-18 DIAGNOSIS — L03114 Cellulitis of left upper limb: Secondary | ICD-10-CM

## 2020-07-18 MED ORDER — SULFAMETHOXAZOLE-TRIMETHOPRIM 800-160 MG PO TABS: 1.0000 | ORAL_TABLET | Freq: Two times a day (BID) | ORAL | 0 refills | Status: DC

## 2020-07-18 NOTE — Discharge Instructions (Addendum)
Please change your dressing 2-3 times daily. Do not apply any ointments or creams. Each time you change your dressing, make sure you clean gently around the perimeter of the wound with gentle soap and warm water. Pat your wound dry and let it air out if possible for 1-2 hours before reapplying another dressing.  °

## 2020-07-18 NOTE — ED Triage Notes (Signed)
Pt reports her dog scratched her arm.

## 2020-07-18 NOTE — ED Provider Notes (Signed)
[place]   MRN: [id number] DOB: 12/10/1945  Subjective:   Linda Allen is a 75 y.o. female presenting for 4-day history of acute onset persistent and worsening left forearm pain.  Patient was scratched by her dog while they were playing over the area.  She has kept her wound covered and has used Neosporin over the area.  The redness is spreading and the pain is radiating both up and down her forearm.  She has not had fever, nausea or vomiting.  Has not used any other medications besides the Neosporin.  Patient recently took doxycycline and reports that she does not want to take any capsules of any kind.  No current facility-administered medications for this encounter.  Current Outpatient Medications:    albuterol (VENTOLIN HFA) 108 (90 Base) MCG/ACT inhaler, Inhale 2 puffs into the lungs every 6 (six) hours as needed for wheezing or shortness of breath., Disp: 8 g, Rfl: 2   amLODipine (NORVASC) 5 MG tablet, TAKE 1 TABLET(5 MG) BY MOUTH DAILY, Disp: 30 tablet, Rfl: 2   budesonide-formoterol (SYMBICORT) 80-4.5 MCG/ACT inhaler, Inhale 2 puffs into the lungs 2 (two) times daily at 10 AM and 5 PM., Disp: 1 each, Rfl: 12   doxycycline (VIBRAMYCIN) 100 MG capsule, Take 1 capsule (100 mg total) by mouth 2 (two) times daily., Disp: 20 capsule, Rfl: 0   lisinopril (ZESTRIL) 20 MG tablet, TAKE 1 TABLET(20 MG) BY MOUTH DAILY, Disp: 90 tablet, Rfl: 1   Allergies  Allergen Reactions   Bee Venom Shortness Of Breath   Ivp Dye [Iodinated Diagnostic Agents] Shortness Of Breath and Rash   Other Rash    Contrast dye   Penicillins Hives and Rash    Tolerated Cephalosporin Date: 07/09/19.      Past Medical History:  Diagnosis Date   Allergy    Angio-edema    Arthritis    L hip   Atypical mole 01/21/2014   SEBACECIOUS ATYPIA RIGHT SCALP = MOHS   Heart murmur    Hypertension    Scleroderma (HCC)    Urticaria      Past Surgical History:  Procedure Laterality Date    ADENOIDECTOMY     ENDOVENOUS ABLATION SAPHENOUS VEIN W/ LASER Left 09/26/2018   endovenous laser ablation left greater saphenous vein by Ruta Hinds MD    ENDOVENOUS ABLATION SAPHENOUS VEIN W/ LASER Right 10/17/2018   endovenous laser ablation right greater saphenous vein by Ruta Hinds MD    JOINT REPLACEMENT  2011, 2012   both knees   REVERSE SHOULDER ARTHROPLASTY Left 09/20/2019   Procedure: REVERSE SHOULDER ARTHROPLASTY;  Surgeon: Netta Cedars, MD;  Location: [facility];  Service: Orthopedics;  Laterality: Left;   SHOULDER ACROMIOPLASTY Right 2012   TONSILLECTOMY     TOTAL HIP ARTHROPLASTY Left 07/09/2019   Procedure: TOTAL HIP ARTHROPLASTY ANTERIOR APPROACH;  Surgeon: Paralee Cancel, MD;  Location: [facility];  Service: Orthopedics;  Laterality: Left;  70 mins    Family History  Problem Relation Age of Onset   CVA Mother    Parkinson's disease Father    CVA Father    Heart attack Father    Eczema Sister    Ulcerative colitis Brother    Heart failure Paternal Grandmother     Social History   Tobacco Use   Smoking status: Former    Years: 10.00    Pack years: 0.00    Types: Cigarettes    Quit date: 12/31/1980    Years since  quitting: 39.5   Smokeless tobacco: Never   Tobacco comments:    smoked for 10 yeas, d/c 40 y ago  Vaping Use   Vaping Use: Never used  Substance Use Topics   Alcohol use: Yes    Comment: occas.   Drug use: Never    ROS   Objective:   Vitals: BP (!) 151/85   Pulse 89   Temp 99.1 F (37.3 C)   Resp 19   SpO2 99%   Physical Exam Constitutional:      General: She is not in acute distress.    Appearance: Normal appearance. She is well-developed. She is not ill-appearing, toxic-appearing or diaphoretic.  HENT:     Head: Normocephalic and atraumatic.     Nose: Nose normal.     Mouth/Throat:     Mouth: Mucous membranes are moist.     Pharynx: Oropharynx is clear.  Eyes:     General: No scleral icterus.       Right eye: No discharge.         Left eye: No discharge.     Extraocular Movements: Extraocular movements intact.     Conjunctiva/sclera: Conjunctivae normal.     Pupils: Pupils are equal, round, and reactive to light.  Cardiovascular:     Rate and Rhythm: Normal rate.  Pulmonary:     Effort: Pulmonary effort is normal.  Musculoskeletal:       Arms:  Skin:    General: Skin is warm and dry.  Neurological:     General: No focal deficit present.     Mental Status: She is alert and oriented to person, place, and time.  Psychiatric:        Mood and Affect: Mood normal.        Behavior: Behavior normal.        Thought Content: Thought content normal.        Judgment: Judgment normal.     Assessment and Plan :   PDMP not reviewed this encounter.  1. Cellulitis of left forearm   2. Dog scratch     Start Bactrim, Tylenol as needed for pain control.  Wound care reviewed. Counseled patient on potential for adverse effects with medications prescribed/recommended today, ER and return-to-clinic precautions discussed, patient verbalized understanding.    Jaynee Eagles, [place] 07/18/20 1849

## 2020-07-22 ENCOUNTER — Telehealth: Payer: Self-pay | Admitting: Nurse Practitioner

## 2020-07-22 NOTE — Telephone Encounter (Signed)
Left message for patient to call back and schedule Medicare Annual Wellness Visit (AWV) either virtually or in office.   Last AWV 06/13/19  please schedule at anytime with [facility]    This should be a 45 minute visit.

## 2020-07-31 ENCOUNTER — Encounter (HOSPITAL_COMMUNITY): Payer: Self-pay | Admitting: Emergency Medicine

## 2020-07-31 ENCOUNTER — Other Ambulatory Visit: Payer: Self-pay

## 2020-07-31 ENCOUNTER — Ambulatory Visit (HOSPITAL_COMMUNITY)
Admission: EM | Admit: 2020-07-31 | Discharge: 2020-07-31 | Disposition: A | Discharge status: Home / Self Care | Payer: Medicare PPO | Attending: Urgent Care | Admitting: Urgent Care

## 2020-07-31 DIAGNOSIS — L03114 Cellulitis of left upper limb: Secondary | ICD-10-CM | POA: Diagnosis not present

## 2020-07-31 DIAGNOSIS — M79632 Pain in left forearm: Secondary | ICD-10-CM | POA: Diagnosis not present

## 2020-07-31 MED ORDER — DOXYCYCLINE HYCLATE 100 MG PO CAPS: 100.0000 mg | ORAL_CAPSULE | Freq: Two times a day (BID) | ORAL | 0 refills | Status: DC

## 2020-07-31 NOTE — ED Triage Notes (Signed)
Pt presents for follow-up for left arm injury. States was seen on 7/09 and completed the course of antibiotic prescribed. States arm where dog scratched her is red, warm to touch, and has clear to orange discharge.

## 2020-07-31 NOTE — ED Provider Notes (Signed)
[place]   MRN: [id number] DOB: 03-11-1945  Subjective:   Linda Allen is a 75 y.o. female presenting for recheck on left forearm pain.  Initially was seen on 07/18/2020 and started on Bactrim at her request that she did not want any form of capsule as an antibiotic.  She was treated for cellulitis of the left forearm secondary to an injury she suffered from a dog scratch.  She does report that she experienced some relief for a day but then her symptoms have persisted with pain, weeping, redness.  Denies fever, nausea, vomiting, lymph node pain or swelling, loss of sensation.  She has allergies to penicillin and therefore was not able to use Augmentin.  No current facility-administered medications for this encounter.  Current Outpatient Medications:    albuterol (VENTOLIN HFA) 108 (90 Base) MCG/ACT inhaler, Inhale 2 puffs into the lungs every 6 (six) hours as needed for wheezing or shortness of breath., Disp: 8 g, Rfl: 2   amLODipine (NORVASC) 5 MG tablet, TAKE 1 TABLET(5 MG) BY MOUTH DAILY, Disp: 30 tablet, Rfl: 2   budesonide-formoterol (SYMBICORT) 80-4.5 MCG/ACT inhaler, Inhale 2 puffs into the lungs 2 (two) times daily at 10 AM and 5 PM., Disp: 1 each, Rfl: 12   doxycycline (VIBRAMYCIN) 100 MG capsule, Take 1 capsule (100 mg total) by mouth 2 (two) times daily., Disp: 20 capsule, Rfl: 0   lisinopril (ZESTRIL) 20 MG tablet, TAKE 1 TABLET(20 MG) BY MOUTH DAILY, Disp: 90 tablet, Rfl: 1   sulfamethoxazole-trimethoprim (BACTRIM DS) 800-160 MG tablet, Take 1 tablet by mouth 2 (two) times daily., Disp: 20 tablet, Rfl: 0   Allergies  Allergen Reactions   Bee Venom Shortness Of Breath   Ivp Dye [Iodinated Diagnostic Agents] Shortness Of Breath and Rash   Other Rash    Contrast dye   Penicillins Hives and Rash    Tolerated Cephalosporin Date: 07/09/19.      Past Medical History:  Diagnosis Date   Allergy    Angio-edema    Arthritis    L hip   Atypical mole  01/21/2014   SEBACECIOUS ATYPIA RIGHT SCALP = MOHS   Heart murmur    Hypertension    Scleroderma (HCC)    Urticaria      Past Surgical History:  Procedure Laterality Date   ADENOIDECTOMY     ENDOVENOUS ABLATION SAPHENOUS VEIN W/ LASER Left 09/26/2018   endovenous laser ablation left greater saphenous vein by Ruta Hinds MD    ENDOVENOUS ABLATION SAPHENOUS VEIN W/ LASER Right 10/17/2018   endovenous laser ablation right greater saphenous vein by Ruta Hinds MD    JOINT REPLACEMENT  2011, 2012   both knees   REVERSE SHOULDER ARTHROPLASTY Left 09/20/2019   Procedure: REVERSE SHOULDER ARTHROPLASTY;  Surgeon: Netta Cedars, MD;  Location: [facility];  Service: Orthopedics;  Laterality: Left;   SHOULDER ACROMIOPLASTY Right 2012   TONSILLECTOMY     TOTAL HIP ARTHROPLASTY Left 07/09/2019   Procedure: TOTAL HIP ARTHROPLASTY ANTERIOR APPROACH;  Surgeon: Paralee Cancel, MD;  Location: [facility];  Service: Orthopedics;  Laterality: Left;  70 mins    Family History  Problem Relation Age of Onset   CVA Mother    Parkinson's disease Father    CVA Father    Heart attack Father    Eczema Sister    Ulcerative colitis Brother    Heart failure Paternal Grandmother     Social History   Tobacco Use   Smoking status:  Former    Years: 10.00    Types: Cigarettes    Quit date: 12/31/1980    Years since quitting: 39.6   Smokeless tobacco: Never   Tobacco comments:    smoked for 10 yeas, d/c 40 y ago  Vaping Use   Vaping Use: Never used  Substance Use Topics   Alcohol use: Yes    Comment: occas.   Drug use: Never    ROS   Objective:   Vitals: BP (!) 154/88 (BP Location: Left Arm)   Pulse 69   Temp 98.3 F (36.8 C) (Oral)   Resp 17   SpO2 96%   Physical Exam Constitutional:      General: She is not in acute distress.    Appearance: Normal appearance. She is well-developed. She is not ill-appearing, toxic-appearing or diaphoretic.  HENT:     Head: Normocephalic and atraumatic.      Nose: Nose normal.     Mouth/Throat:     Mouth: Mucous membranes are moist.     Pharynx: Oropharynx is clear.  Eyes:     General: No scleral icterus.    Extraocular Movements: Extraocular movements intact.     Pupils: Pupils are equal, round, and reactive to light.  Cardiovascular:     Rate and Rhythm: Normal rate.  Pulmonary:     Effort: Pulmonary effort is normal.  Musculoskeletal:     Left forearm: Tenderness present. No swelling, edema, deformity, lacerations or bony tenderness.     Left wrist: No swelling, deformity, effusion, lacerations, tenderness, bony tenderness, snuff box tenderness or crepitus. Normal range of motion.     Left hand: No swelling, deformity, lacerations, tenderness or bony tenderness. Normal range of motion. Normal sensation. Normal capillary refill.       Arms:  Skin:    General: Skin is warm and dry.  Neurological:     General: No focal deficit present.     Mental Status: She is alert and oriented to person, place, and time.  Psychiatric:        Mood and Affect: Mood normal.        Behavior: Behavior normal.    Assessment and Plan :   PDMP not reviewed this encounter.  1. Cellulitis of left forearm   2. Left forearm pain     Recommended patient start doxycycline despite the fact that it is a capsule, patient is willing to do this.  Recommended supportive care otherwise.  Low suspicion for systemic infection or osteomyelitis given physical exam findings.  Counseled patient on potential for adverse effects with medications prescribed/recommended today, ER and return-to-clinic precautions discussed, patient verbalized understanding.    Jaynee Eagles, PA-C 07/31/20 1121

## 2020-08-14 ENCOUNTER — Telehealth: Payer: Self-pay | Admitting: Nurse Practitioner

## 2020-08-14 NOTE — Telephone Encounter (Signed)
Left message for patient to call back and schedule Medicare Annual Wellness Visit (AWV) either virtually or in office. Left both  my jabber number [phone] and office number    Last AWV  06/13/19 ; please schedule at anytime with [facility] Nurse Health Advisor 1 or 2   This should be a 45 minute visit.

## 2020-09-08 ENCOUNTER — Telehealth: Payer: Self-pay | Admitting: Nurse Practitioner

## 2020-09-08 NOTE — Telephone Encounter (Signed)
Left message for patient to call back and schedule Medicare Annual Wellness Visit (AWV) either virtually or in office.  Left both my jabber number [phone] and office number    Last AWV 06/13/19 please schedule at anytime with [facility] [place]    This should be a 45 minute visit.

## 2020-10-01 ENCOUNTER — Other Ambulatory Visit: Payer: Self-pay

## 2020-10-01 ENCOUNTER — Ambulatory Visit (HOSPITAL_COMMUNITY): Payer: Medicare PPO | Attending: Cardiovascular Disease

## 2020-10-01 DIAGNOSIS — I350 Nonrheumatic aortic (valve) stenosis: Secondary | ICD-10-CM | POA: Insufficient documentation

## 2020-10-01 DIAGNOSIS — E669 Obesity, unspecified: Secondary | ICD-10-CM | POA: Insufficient documentation

## 2020-10-01 LAB — ECHOCARDIOGRAM COMPLETE
AR max vel: 1.02 cm2
AV Area VTI: 0.97 cm2
AV Area mean vel: 0.94 cm2
AV Mean grad: 29 mmHg
AV Peak grad: 45.4 mmHg
Ao pk vel: 3.37 m/s
Area-P 1/2: 4.6 cm2
MV VTI: 2.34 cm2
P 1/2 time: 334 msec
S' Lateral: 3 cm

## 2020-10-15 ENCOUNTER — Encounter: Payer: Self-pay | Admitting: Nurse Practitioner

## 2020-10-15 ENCOUNTER — Other Ambulatory Visit: Payer: Self-pay

## 2020-10-15 ENCOUNTER — Ambulatory Visit (INDEPENDENT_AMBULATORY_CARE_PROVIDER_SITE_OTHER): Payer: Medicare PPO

## 2020-10-15 ENCOUNTER — Ambulatory Visit: Payer: Medicare PPO | Admitting: Nurse Practitioner

## 2020-10-15 VITALS — BP 138/96 | HR 82 | Temp 98.2°F | Ht 68.0 in | Wt 278.2 lb

## 2020-10-15 DIAGNOSIS — I10 Essential (primary) hypertension: Secondary | ICD-10-CM

## 2020-10-15 DIAGNOSIS — Z0000 Encounter for general adult medical examination without abnormal findings: Secondary | ICD-10-CM | POA: Diagnosis not present

## 2020-10-15 LAB — POCT URINALYSIS DIPSTICK
Bilirubin, UA: NEGATIVE
Blood, UA: NEGATIVE
Glucose, UA: NEGATIVE
Ketones, UA: NEGATIVE
Nitrite, UA: NEGATIVE
Protein, UA: NEGATIVE
Spec Grav, UA: 1.02 (ref 1.010–1.025)
Urobilinogen, UA: 0.2 E.U./dL
pH, UA: 7 (ref 5.0–8.0)

## 2020-10-15 LAB — POCT UA - MICROALBUMIN
Creatinine, POC: 200 mg/dL
Microalbumin Ur, POC: 80 mg/L

## 2020-10-15 MED ORDER — LISINOPRIL 20 MG PO TABS: ORAL_TABLET | ORAL | 1 refills | Status: DC

## 2020-10-15 MED ORDER — AMLODIPINE BESYLATE 5 MG PO TABS: ORAL_TABLET | ORAL | 1 refills | Status: DC

## 2020-10-15 NOTE — Patient Instructions (Signed)
Linda Allen , Thank you for taking time to come for your Medicare Wellness Visit. I appreciate your ongoing commitment to your health goals. Please review the following plan we discussed and let me know if I can assist you in the future.   Screening recommendations/referrals: Colonoscopy: completed 12/27/2019 Mammogram: completed 11/04/2019 Bone Density: due Recommended yearly ophthalmology/optometry visit for glaucoma screening and checkup Recommended yearly dental visit for hygiene and checkup  Vaccinations: Influenza vaccine: completed 10/14/2020 Pneumococcal vaccine: due Tdap vaccine: due Shingles vaccine: discussed   Covid-19:10/11/2019, 04/03/2019, 03/06/2019  Advanced directives: Please bring a copy of your POA (Power of Attorney) and/or Living Will to your next appointment.   Conditions/risks identified: none  Next appointment: Follow up in one year for your annual wellness visit    Preventive Care 65 Years and Older, Female Preventive care refers to lifestyle choices and visits with your health care provider that can promote health and wellness. What does preventive care include? A yearly physical exam. This is also called an annual well check. Dental exams once or twice a year. Routine eye exams. Ask your health care provider how often you should have your eyes checked. Personal lifestyle choices, including: Daily care of your teeth and gums. Regular physical activity. Eating a healthy diet. Avoiding tobacco and drug use. Limiting alcohol use. Practicing safe sex. Taking low-dose aspirin every day. Taking vitamin and mineral supplements as recommended by your health care provider. What happens during an annual well check? The services and screenings done by your health care provider during your annual well check will depend on your age, overall health, lifestyle risk factors, and family history of disease. Counseling  Your health care provider may ask you questions  about your: Alcohol use. Tobacco use. Drug use. Emotional well-being. Home and relationship well-being. Sexual activity. Eating habits. History of falls. Memory and ability to understand (cognition). Work and work Statistician. Reproductive health. Screening  You may have the following tests or measurements: Height, weight, and BMI. Blood pressure. Lipid and cholesterol levels. These may be checked every 5 years, or more frequently if you are over 30 years old. Skin check. Lung cancer screening. You may have this screening every year starting at age 48 if you have a 30-pack-year history of smoking and currently smoke or have quit within the past 15 years. Fecal occult blood test (FOBT) of the stool. You may have this test every year starting at age 57. Flexible sigmoidoscopy or colonoscopy. You may have a sigmoidoscopy every 5 years or a colonoscopy every 10 years starting at age 25. Hepatitis C blood test. Hepatitis B blood test. Sexually transmitted disease (STD) testing. Diabetes screening. This is done by checking your blood sugar (glucose) after you have not eaten for a while (fasting). You may have this done every 1-3 years. Bone density scan. This is done to screen for osteoporosis. You may have this done starting at age 24. Mammogram. This may be done every 1-2 years. Talk to your health care provider about how often you should have regular mammograms. Talk with your health care provider about your test results, treatment options, and if necessary, the need for more tests. Vaccines  Your health care provider may recommend certain vaccines, such as: Influenza vaccine. This is recommended every year. Tetanus, diphtheria, and acellular pertussis (Tdap, Td) vaccine. You may need a Td booster every 10 years. Zoster vaccine. You may need this after age 70. Pneumococcal 13-valent conjugate (PCV13) vaccine. One dose is recommended after age 42. Pneumococcal  polysaccharide (PPSV23)  vaccine. One dose is recommended after age 74. Talk to your health care provider about which screenings and vaccines you need and how often you need them. This information is not intended to replace advice given to you by your health care provider. Make sure you discuss any questions you have with your health care provider. Document Released: 01/23/2015 Document Revised: 09/16/2015 Document Reviewed: 10/28/2014 Elsevier Interactive Patient Education  2017 [place] Prevention in the Home Falls can cause injuries. They can happen to people of all ages. There are many things you can do to make your home safe and to help prevent falls. What can I do on the outside of my home? Regularly fix the edges of walkways and driveways and fix any cracks. Remove anything that might make you trip as you walk through a door, such as a raised step or threshold. Trim any bushes or trees on the path to your home. Use bright outdoor lighting. Clear any walking paths of anything that might make someone trip, such as rocks or tools. Regularly check to see if handrails are loose or broken. Make sure that both sides of any steps have handrails. Any raised decks and porches should have guardrails on the edges. Have any leaves, snow, or ice cleared regularly. Use sand or salt on walking paths during winter. Clean up any spills in your garage right away. This includes oil or grease spills. What can I do in the bathroom? Use night lights. Install grab bars by the toilet and in the tub and shower. Do not use towel bars as grab bars. Use non-skid mats or decals in the tub or shower. If you need to sit down in the shower, use a plastic, non-slip stool. Keep the floor dry. Clean up any water that spills on the floor as soon as it happens. Remove soap buildup in the tub or shower regularly. Attach bath mats securely with double-sided non-slip rug tape. Do not have throw rugs and other things on the floor that  can make you trip. What can I do in the bedroom? Use night lights. Make sure that you have a light by your bed that is easy to reach. Do not use any sheets or blankets that are too big for your bed. They should not hang down onto the floor. Have a firm chair that has side arms. You can use this for support while you get dressed. Do not have throw rugs and other things on the floor that can make you trip. What can I do in the kitchen? Clean up any spills right away. Avoid walking on wet floors. Keep items that you use a lot in easy-to-reach places. If you need to reach something above you, use a strong step stool that has a grab bar. Keep electrical cords out of the way. Do not use floor polish or wax that makes floors slippery. If you must use wax, use non-skid floor wax. Do not have throw rugs and other things on the floor that can make you trip. What can I do with my stairs? Do not leave any items on the stairs. Make sure that there are handrails on both sides of the stairs and use them. Fix handrails that are broken or loose. Make sure that handrails are as long as the stairways. Check any carpeting to make sure that it is firmly attached to the stairs. Fix any carpet that is loose or worn. Avoid having throw rugs at the top  or bottom of the stairs. If you do have throw rugs, attach them to the floor with carpet tape. Make sure that you have a light switch at the top of the stairs and the bottom of the stairs. If you do not have them, ask someone to add them for you. What else can I do to help prevent falls? Wear shoes that: Do not have high heels. Have rubber bottoms. Are comfortable and fit you well. Are closed at the toe. Do not wear sandals. If you use a stepladder: Make sure that it is fully opened. Do not climb a closed stepladder. Make sure that both sides of the stepladder are locked into place. Ask someone to hold it for you, if possible. Clearly mark and make sure that you  can see: Any grab bars or handrails. First and last steps. Where the edge of each step is. Use tools that help you move around (mobility aids) if they are needed. These include: Canes. Walkers. Scooters. Crutches. Turn on the lights when you go into a dark area. Replace any light bulbs as soon as they burn out. Set up your furniture so you have a clear path. Avoid moving your furniture around. If any of your floors are uneven, fix them. If there are any pets around you, be aware of where they are. Review your medicines with your doctor. Some medicines can make you feel dizzy. This can increase your chance of falling. Ask your doctor what other things that you can do to help prevent falls. This information is not intended to replace advice given to you by your health care provider. Make sure you discuss any questions you have with your health care provider. Document Released: 10/23/2008 Document Revised: 06/04/2015 Document Reviewed: 01/31/2014 Elsevier Interactive Patient Education  2017 [employer].

## 2020-10-15 NOTE — Patient Instructions (Signed)
How to Take Your Blood Pressure Blood pressure is a measurement of how strongly your blood is pressing against the walls of your arteries. Arteries are blood vessels that carry blood from your heart throughout your body. Your health care provider takes your blood pressure at each office visit. You can also take your own blood pressure at home with a blood pressure monitor. You may need to take your own blood pressure to: Confirm a diagnosis of high blood pressure (hypertension). Monitor your blood pressure over time. Make sure your blood pressure medicine is working. Supplies needed: Blood pressure monitor. Dining room chair to sit in. Table or desk. Small notebook and pencil or pen. How to prepare To get the most accurate reading, avoid the following for 30 minutes before you check your blood pressure: Drinking caffeine. Drinking alcohol. Eating. Smoking. Exercising. Five minutes before you check your blood pressure: Use the bathroom and urinate so that you have an empty bladder. Sit quietly in a dining room chair. Do not sit in a soft couch or an armchair. Do not talk. How to take your blood pressure To check your blood pressure, follow the instructions in the manual that came with your blood pressure monitor. If you have a digital blood pressure monitor, the instructions may be as follows: Sit up straight in a chair. Place your feet on the floor. Do not cross your ankles or legs. Rest your left arm at the level of your heart on a table or desk or on the arm of a chair. Pull up your shirt sleeve. Wrap the blood pressure cuff around the upper part of your left arm, 1 inch (2.5 cm) above your elbow. It is best to wrap the cuff around bare skin. Fit the cuff snugly around your arm. You should be able to place only one finger between the cuff and your arm. Position the cord so that it rests in the bend of your elbow. Press the power button. Sit quietly while the cuff inflates and  deflates. Read the digital reading on the monitor screen and write the numbers down (record them) in a notebook. Wait 2-3 minutes, then repeat the steps, starting at step 1. What does my blood pressure reading mean? A blood pressure reading consists of a higher number over a lower number. Ideally, your blood pressure should be below 120/80. The first ("top") number is called the systolic pressure. It is a measure of the pressure in your arteries as your heart beats. The second ("bottom") number is called the diastolic pressure. It is a measure of the pressure in your arteries as the heart relaxes. Blood pressure is classified into five stages. The following are the stages for adults who do not have a short-term serious illness or a chronic condition. Systolic pressure and diastolic pressure are measured in a unit called mm Hg (millimeters of mercury).  Normal Systolic pressure: below 413. Diastolic pressure: below 80. Elevated Systolic pressure: 244-010. Diastolic pressure: below 80. Hypertension stage 1 Systolic pressure: 272-536. Diastolic pressure: 64-40. Hypertension stage 2 Systolic pressure: 347 or above. Diastolic pressure: 90 or above. You can have elevated blood pressure or hypertension even if only the systolic or only the diastolic number in your reading is higher than normal. Follow these instructions at home: Medicines Take over-the-counter and prescription medicines only as told by your health care provider. Tell your health care provider if you are having any side effects from blood pressure medicine. General instructions Check your blood pressure as often as  recommended by your health care provider. Check your blood pressure at the same time every day. Take your monitor to the next appointment with your health care provider to make sure that: You are using it correctly. It provides accurate readings. Understand what your goal blood pressure numbers are. Keep all  follow-up visits as told by your health care provider. This is important. General tips Your health care provider can suggest a reliable monitor that will meet your needs. There are several types of home blood pressure monitors. Choose a monitor that has an arm cuff. Do not choose a monitor that measures your blood pressure from your wrist or finger. Choose a cuff that wraps snugly around your upper arm. You should be able to fit only one finger between your arm and the cuff. You can buy a blood pressure monitor at most drugstores or online. Where to find more information American Heart Association: www.heart.org Contact a health care provider if: Your blood pressure is consistently high. Your blood pressure is suddenly low. Get help right away if: Your systolic blood pressure is higher than 180. Your diastolic blood pressure is higher than 120. Summary Blood pressure is a measurement of how strongly your blood is pressing against the walls of your arteries. A blood pressure reading consists of a higher number over a lower number. Ideally, your blood pressure should be below 120/80. Check your blood pressure at the same time every day. Avoid caffeine, alcohol, smoking, and exercise for 30 minutes prior to checking your blood pressure. These agents can affect the accuracy of the blood pressure reading. This information is not intended to replace advice given to you by your health care provider. Make sure you discuss any questions you have with your health care provider. Document Revised: 11/06/2019 Document Reviewed: 12/21/2018 Elsevier Patient Education  2022 [employer].

## 2020-10-15 NOTE — Progress Notes (Signed)
This visit occurred during the SARS-CoV-2 public health emergency.  Safety protocols were in place, including screening questions prior to the visit, additional usage of staff PPE, and extensive cleaning of exam room while observing appropriate contact time as indicated for disinfecting solutions.  Subjective:   Linda Allen is a 75 y.o. female who presents for Medicare Annual (Subsequent) preventive examination.  Review of Systems     Cardiac Risk Factors include: advanced age (>34men, >23 women);hypertension;obesity (BMI >30kg/m2);sedentary lifestyle     Objective:    Today's Vitals   10/15/20 1126  BP: (!) 138/96  Pulse: 82  Temp: 98.2 F (36.8 C)  TempSrc: Oral  Weight: 278 lb 3.2 oz (126.2 kg)  Height: 5\' 8"  (1.727 m)   Body mass index is 42.3 kg/m.  Advanced Directives 10/15/2020 09/18/2019 07/09/2019 06/26/2019 06/13/2019 06/21/2018  Does Patient Have a Medical Advance Directive? Yes Yes Yes Yes Yes Yes  Type of Paramedic of [place];Living will [place];Living will [place];Living will [place];Living will [place];Living will [place];Living will  Does patient want to make changes to medical advance directive? - - No - Patient declined - - No - Patient declined  Copy of [place] in Chart? Yes - validated most recent copy scanned in chart (See row information) - No - copy requested - No - copy requested -    Current Medications (verified) Outpatient Encounter Medications as of 10/15/2020  Medication Sig   [DISCONTINUED] albuterol (VENTOLIN HFA) 108 (90 Base) MCG/ACT inhaler Inhale 2 puffs into the lungs every 6 (six) hours as needed for wheezing or shortness of breath.   [DISCONTINUED] amLODipine (NORVASC) 5 MG tablet TAKE 1 TABLET(5 MG) BY MOUTH DAILY   [DISCONTINUED] budesonide-formoterol (SYMBICORT) 80-4.5 MCG/ACT inhaler  Inhale 2 puffs into the lungs 2 (two) times daily at 10 AM and 5 PM.   [DISCONTINUED] doxycycline (VIBRAMYCIN) 100 MG capsule Take 1 capsule (100 mg total) by mouth 2 (two) times daily.   [DISCONTINUED] ferrous sulfate (FERROUSUL) 325 (65 FE) MG tablet Take 1 tablet (325 mg total) by mouth 3 (three) times daily with meals for 14 days. (Patient not taking: Reported on 09/12/2019)   [DISCONTINUED] lisinopril (ZESTRIL) 20 MG tablet TAKE 1 TABLET(20 MG) BY MOUTH DAILY   [DISCONTINUED] sulfamethoxazole-trimethoprim (BACTRIM DS) 800-160 MG tablet Take 1 tablet by mouth 2 (two) times daily.   No facility-administered encounter medications on file as of 10/15/2020.    Allergies (verified) Bee venom, Ivp dye [iodinated diagnostic agents], Other, and Penicillins   History: Past Medical History:  Diagnosis Date   Allergy    Angio-edema    Arthritis    L hip   Atypical mole 01/21/2014   SEBACECIOUS ATYPIA RIGHT SCALP = MOHS   Heart murmur    Hypertension    Scleroderma (HCC)    Urticaria    Past Surgical History:  Procedure Laterality Date   ADENOIDECTOMY     ENDOVENOUS ABLATION SAPHENOUS VEIN W/ LASER Left 09/26/2018   endovenous laser ablation left greater saphenous vein by Ruta Hinds MD    ENDOVENOUS ABLATION SAPHENOUS VEIN W/ LASER Right 10/17/2018   endovenous laser ablation right greater saphenous vein by Ruta Hinds MD    JOINT REPLACEMENT  2011, 2012   both knees   REVERSE SHOULDER ARTHROPLASTY Left 09/20/2019   Procedure: REVERSE SHOULDER ARTHROPLASTY;  Surgeon: Netta Cedars, MD;  Location: [facility];  Service: Orthopedics;  Laterality: Left;   SHOULDER ACROMIOPLASTY Right 2012   TONSILLECTOMY     TOTAL HIP ARTHROPLASTY Left 07/09/2019   Procedure: TOTAL HIP ARTHROPLASTY ANTERIOR APPROACH;  Surgeon: Paralee Cancel, MD;  Location: [facility];  Service: Orthopedics;  Laterality: Left;  70 mins   Family History  Problem Relation Age of Onset   CVA Mother    Parkinson's disease  Father    CVA Father    Heart attack Father    Eczema Sister    Ulcerative colitis Brother    Heart failure Paternal Grandmother    Social History   Socioeconomic History   Marital status: Married    Spouse name: Not on file   Number of children: 2   Years of education: Not on file   Highest education level: Not on file  Occupational History   Occupation: retired  Tobacco Use   Smoking status: Former    Years: 10.00    Types: Cigarettes    Quit date: 12/31/1980    Years since quitting: 39.8   Smokeless tobacco: Never   Tobacco comments:    smoked for 10 yeas, d/c 40 y ago  Vaping Use   Vaping Use: Never used  Substance and Sexual Activity   Alcohol use: Yes    Comment: occas.   Drug use: Never   Sexual activity: Yes    Birth control/protection: None    Comment: number of sex partners in the last 12 months  1  Other Topics Concern   Not on file  Social History Narrative   Exercise walking 3 times per week for 30 minutes.      Anticipatory guidance - has living will, full code, but would not want to be on ventilator for prolonged time. organ donor.             Social Determinants of Health   Financial Resource Strain: Low Risk    Difficulty of Paying Living Expenses: Not hard at all  Food Insecurity: No Food Insecurity   Worried About Charity fundraiser in the Last Year: Never true   Freedom in the Last Year: Never true  Transportation Needs: No Transportation Needs   Lack of Transportation (Medical): No   Lack of Transportation (Non-Medical): No  Physical Activity: Inactive   Days of Exercise per Week: 0 days   Minutes of Exercise per Session: 0 min  Stress: No Stress Concern Present   Feeling of Stress : Not at all  Social Connections: Not on file    Tobacco Counseling Counseling given: Not Answered Tobacco comments: smoked for 10 yeas, d/c 40 y ago   Clinical Intake:  Pre-visit preparation completed: Yes  Pain : No/denies pain      Nutritional Status: BMI > 30  Obese Nutritional Risks: None Diabetes: No  How often do you need to have someone help you when you read instructions, pamphlets, or other written materials from your doctor or pharmacy?: 1 - Never What is the last grade level you completed in school?: masters degree  Diabetic? no  Interpreter Needed?: No  Information entered by :: NAllen LPN   Activities of Daily Living In your present state of health, do you have any difficulty performing the following activities: 10/15/2020 10/15/2020  Hearing? N N  Vision? N N  Difficulty concentrating or making decisions? N N  Walking or climbing stairs? N N  Dressing or bathing? N N  Doing errands, shopping? N N  Preparing Food and eating ?  N -  Using the Toilet? N -  In the past six months, have you accidently leaked urine? N -  Do you have problems with loss of bowel control? N -  Managing your Medications? N -  Managing your Finances? N -  Housekeeping or managing your Housekeeping? N -  Some recent data might be hidden    Patient Care Team: Minette Brine, FNP as PCP - General (General Practice) O'Neal, Cassie Freer, MD as PCP - Cardiology (Cardiology) [name], MD as Consulting Physician (Dermatology)  Indicate any recent Medical Services you may have received from other than [facility] providers in the past year (date may be approximate).     Assessment:   This is a routine wellness examination for [place].  Hearing/Vision screen Vision Screening - Comments:: Regular eye exams, Dr. Sabra Heck  Dietary issues and exercise activities discussed: Current Exercise Habits: The patient does not participate in regular exercise at present   Goals Addressed             This Visit's Progress    Patient Stated       10/15/2020, wants to weigh 155 pounds       Depression Screen PHQ 2/9 Scores 10/15/2020 10/15/2020 06/13/2019 06/13/2019 01/25/2018 12/28/2017 06/17/2013  PHQ - 2 Score 0 0 0 0 0 0 0     Fall Risk Fall Risk  10/15/2020 10/15/2020 06/13/2019 06/13/2019 01/25/2018  Falls in the past year? 0 0 0 0 0  Number falls in past yr: - 0 - - 0  Injury with Fall? - 0 - - 1  Risk for fall due to : Medication side effect - Orthopedic patient - -  Follow up Falls evaluation completed;Education provided;Falls prevention discussed - Falls evaluation completed;Education provided;Falls prevention discussed - -    FALL RISK PREVENTION PERTAINING TO THE HOME:  Any stairs in or around the home? Yes  If so, are there any without handrails? No  Home free of loose throw rugs in walkways, pet beds, electrical cords, etc? Yes  Adequate lighting in your home to reduce risk of falls? Yes   ASSISTIVE DEVICES UTILIZED TO PREVENT FALLS:  Life alert? No  Use of a cane, walker or w/c? No  Grab bars in the bathroom? Yes  Shower chair or bench in shower? No  Elevated toilet seat or a handicapped toilet? Yes   TIMED UP AND GO:  Was the test performed? No .     Gait steady and fast without use of assistive device  Cognitive Function:     6CIT Screen 10/15/2020 06/13/2019  What Year? 0 points 0 points  What month? 0 points 0 points  What time? 0 points 0 points  Count back from 20 0 points 0 points  Months in reverse 0 points 0 points  Repeat phrase 0 points 0 points  Total Score 0 0    Immunizations Immunization History  Administered Date(s) Administered   Influenza,inj,Quad PF,6+ Mos 10/15/2012   Influenza-Unspecified 11/20/2015, 10/11/2019   PFIZER(Purple Top)SARS-COV-2 Vaccination 03/06/2019, 04/03/2019, 10/11/2019   Pneumococcal-Unspecified 10/10/2012   Tdap 08/10/2009    TDAP status: Due, Education has been provided regarding the importance of this vaccine. Advised may receive this vaccine at local pharmacy or Health Dept. Aware to provide a copy of the vaccination record if obtained from local pharmacy or Health Dept. Verbalized acceptance and understanding.  Flu Vaccine status: Up to  date  Pneumococcal vaccine status: Up to date  Covid-19 vaccine status: Completed vaccines  Qualifies  for Shingles Vaccine? Yes   Zostavax completed No   Shingrix Completed?: No.    Education has been provided regarding the importance of this vaccine. Patient has been advised to call insurance company to determine out of pocket expense if they have not yet received this vaccine. Advised may also receive vaccine at local pharmacy or Health Dept. Verbalized acceptance and understanding.  Screening Tests Health Maintenance  Topic Date Due   Zoster Vaccines- Shingrix (1 of 2) Never done   DEXA SCAN  Never done   TETANUS/TDAP  08/11/2019   COVID-19 Vaccine (4 - Booster for Pfizer series) 01/03/2020   INFLUENZA VACCINE  08/10/2020   MAMMOGRAM  11/03/2020   COLONOSCOPY (Pts 45-48yrs Insurance coverage will need to be confirmed)  12/26/2024   Hepatitis C Screening  Completed   HPV VACCINES  Aged Out    Health Maintenance  Health Maintenance Due  Topic Date Due   Zoster Vaccines- Shingrix (1 of 2) Never done   DEXA SCAN  Never done   TETANUS/TDAP  08/11/2019   COVID-19 Vaccine (4 - Booster for Pfizer series) 01/03/2020   INFLUENZA VACCINE  08/10/2020    Colorectal cancer screening: Type of screening: Colonoscopy. Completed 12/27/2019. Repeat every 5 years  Mammogram status: Completed 11/04/2019. Repeat every year  Bone Density status: due  Lung Cancer Screening: (Low Dose CT Chest recommended if Age 38-80 years, 30 pack-year currently smoking OR have quit w/in 15years.) does not qualify.   Lung Cancer Screening Referral: no  Additional Screening:  Hepatitis C Screening: does qualify; Completed 06/13/2019  Vision Screening: Recommended annual ophthalmology exams for early detection of glaucoma and other disorders of the eye. Is the patient up to date with their annual eye exam?  Yes  Who is the provider or what is the name of the office in which the patient attends annual eye  exams? Dr. Sabra Heck If pt is not established with a provider, would they like to be referred to a provider to establish care? No .   Dental Screening: Recommended annual dental exams for proper oral hygiene  Community Resource Referral / Chronic Care Management: CRR required this visit?  No   CCM required this visit?  No      Plan:     I have personally reviewed and noted the following in the patient's chart:   Medical and social history Use of alcohol, tobacco or illicit drugs  Current medications and supplements including opioid prescriptions.  Functional ability and status Nutritional status Physical activity Advanced directives List of other physicians Hospitalizations, surgeries, and ER visits in previous 12 months Vitals Screenings to include cognitive, depression, and falls Referrals and appointments  In addition, I have reviewed and discussed with patient certain preventive protocols, quality metrics, and best practice recommendations. A written personalized care plan for preventive services as well as general preventive health recommendations were provided to patient.     Kellie Simmering, LPN   46/05/352   Nurse Notes:

## 2020-10-15 NOTE — Progress Notes (Signed)
I,Katawbba Wiggins,acting as a Education administrator for [employer], FNP.,have documented all relevant documentation on the behalf of Minette Brine, FNP,as directed by  Minette Brine, FNP while in the presence of Minette Brine, [place].  This visit occurred during the SARS-CoV-2 public health emergency.  Safety protocols were in place, including screening questions prior to the visit, additional usage of staff PPE, and extensive cleaning of exam room while observing appropriate contact time as indicated for disinfecting solutions.  Subjective:     Patient ID: Linda Allen , female    DOB: [birth year] , 75 y.o.   MRN: [id number]   Chief Complaint  Patient presents with   Hypertension    HPI  Patient here for a blood pressure f/u. She has been without her blood pressure medication for several weeks. She has also been eating more bad foods since her daughter has been home and is not exercising as regularly.   She also had her [facility] by [facility]   Hypertension This is a chronic problem. The current episode started more than 1 year ago. The problem is uncontrolled. Pertinent negatives include no anxiety, blurred vision, chest pain, headaches, peripheral edema or shortness of breath. There are no associated agents to hypertension. Risk factors for coronary artery disease include obesity, sedentary lifestyle and post-menopausal state. Past treatments include nothing. There are no compliance problems.  There is no history of angina, kidney disease or CVA. There is no history of chronic renal disease.    Past Medical History:  Diagnosis Date   Allergy    Angio-edema    Arthritis    L hip   Atypical mole 01/21/2014   SEBACECIOUS ATYPIA RIGHT SCALP = MOHS   Heart murmur    Hypertension    Scleroderma (HCC)    Urticaria      Family History  Problem Relation Age of Onset   CVA Mother    Parkinson's disease Father    CVA Father    Heart attack Father    Eczema Sister    Ulcerative colitis Brother    Heart  failure Paternal Grandmother      Current Outpatient Medications:    amLODipine (NORVASC) 5 MG tablet, Take 1 tablet by mouth daily, Disp: 90 tablet, Rfl: 1   lisinopril (ZESTRIL) 20 MG tablet, TAKE 1 TABLET(20 MG) BY MOUTH DAILY, Disp: 90 tablet, Rfl: 1   Allergies  Allergen Reactions   Bee Venom Shortness Of Breath   Ivp Dye [Iodinated Diagnostic Agents] Shortness Of Breath and Rash   Other Rash    Contrast dye   Penicillins Hives and Rash    Tolerated Cephalosporin Date: 07/09/19.       Review of Systems  Constitutional: Negative.   Eyes:  Negative for blurred vision.  Respiratory: Negative.  Negative for shortness of breath.   Cardiovascular: Negative.  Negative for chest pain.  Gastrointestinal: Negative.   Neurological:  Negative for headaches.  Psychiatric/Behavioral: Negative.    All other systems reviewed and are negative.   Today's Vitals   10/15/20 1205  BP: (!) 138/96  Pulse: 82  Temp: 98.2 F (36.8 C)  Weight: 278 lb 3.2 oz (126.2 kg)  Height: 5' 8"  (1.727 m)  PainSc: 0-No pain   Body mass index is 42.3 kg/m.  Wt Readings from Last 3 Encounters:  10/15/20 278 lb 3.2 oz (126.2 kg)  10/15/20 278 lb 3.2 oz (126.2 kg)  05/12/20 262 lb 6.4 oz (119 kg)    BP Readings from Last 3 Encounters:  10/15/20 (!) 138/96  10/15/20 (!) 138/96  07/31/20 (!) 154/88    Objective:  Physical Exam Constitutional:      General: She is not in acute distress.    Appearance: Normal appearance. She is obese.  Cardiovascular:     Rate and Rhythm: Normal rate and regular rhythm.     Pulses: Normal pulses.     Heart sounds: Murmur (3/6) heard.  Pulmonary:     Effort: Pulmonary effort is normal.     Breath sounds: Normal breath sounds.  Musculoskeletal:        General: No swelling or tenderness. Normal range of motion.     Cervical back: Normal range of motion and neck supple.  Skin:    General: Skin is warm and dry.     Capillary Refill: Capillary refill takes less  than 2 seconds.  Neurological:     General: No focal deficit present.     Mental Status: She is alert and oriented to person, place, and time.     Cranial Nerves: No cranial nerve deficit.     Motor: No weakness.  Psychiatric:        Mood and Affect: Mood normal.        Behavior: Behavior normal.        Thought Content: Thought content normal.        Judgment: Judgment normal.        Assessment And Plan:     1. Essential hypertension Comments: Blood pressure is slightly elevated, she has been without medications for at leas one month. She is to restart her medications.  - BMP8+EGFR  2. Morbid obesity ([place]) Comments: She is encouraged to increase her physical activity and eat healthy diet    Patient was given opportunity to ask questions. Patient verbalized understanding of the plan and was able to repeat key elements of the plan. All questions were answered to their satisfaction.  Minette Brine, FNP   I, Minette Brine, FNP, have reviewed all documentation for this visit. The documentation on 10/15/20 for the exam, diagnosis, procedures, and orders are all accurate and complete.   IF YOU HAVE BEEN REFERRED TO A SPECIALIST, IT MAY TAKE 1-2 WEEKS TO SCHEDULE/PROCESS THE REFERRAL. IF YOU HAVE NOT HEARD FROM US/SPECIALIST IN TWO WEEKS, PLEASE GIVE [place] A CALL AT [phone].   THE PATIENT IS ENCOURAGED TO PRACTICE SOCIAL DISTANCING DUE TO THE COVID-19 PANDEMIC.

## 2020-10-16 LAB — BMP8+EGFR
BUN/Creatinine Ratio: 18 (ref 12–28)
BUN: 19 mg/dL (ref 8–27)
CO2: 24 mmol/L (ref 20–29)
Calcium: 9.7 mg/dL (ref 8.7–10.3)
Chloride: 102 mmol/L (ref 96–106)
Creatinine, Ser: 1.07 mg/dL — ABNORMAL HIGH (ref 0.57–1.00)
Glucose: 91 mg/dL (ref 70–99)
Potassium: 5.4 mmol/L — ABNORMAL HIGH (ref 3.5–5.2)
Sodium: 141 mmol/L (ref 134–144)
eGFR: 55 mL/min/{1.73_m2} — ABNORMAL LOW (ref >59)

## 2020-11-09 DIAGNOSIS — Z1231 Encounter for screening mammogram for malignant neoplasm of breast: Secondary | ICD-10-CM | POA: Diagnosis not present

## 2020-11-09 LAB — HM MAMMOGRAPHY

## 2020-11-10 ENCOUNTER — Encounter: Payer: Self-pay | Admitting: Nurse Practitioner

## 2021-01-29 ENCOUNTER — Telehealth: Payer: Self-pay

## 2021-01-29 NOTE — Telephone Encounter (Signed)
Lvm for pt to give the office a call back to resch physical cancelled on 01/28/2021. Given ext 200.

## 2021-02-01 ENCOUNTER — Encounter: Payer: Medicare PPO | Admitting: Nurse Practitioner

## 2021-02-11 DIAGNOSIS — H5203 Hypermetropia, bilateral: Secondary | ICD-10-CM | POA: Diagnosis not present

## 2021-02-11 DIAGNOSIS — H43393 Other vitreous opacities, bilateral: Secondary | ICD-10-CM | POA: Diagnosis not present

## 2021-02-11 DIAGNOSIS — H43813 Vitreous degeneration, bilateral: Secondary | ICD-10-CM | POA: Diagnosis not present

## 2021-02-11 DIAGNOSIS — H524 Presbyopia: Secondary | ICD-10-CM | POA: Diagnosis not present

## 2021-02-11 DIAGNOSIS — H52223 Regular astigmatism, bilateral: Secondary | ICD-10-CM | POA: Diagnosis not present

## 2021-02-11 DIAGNOSIS — H40053 Ocular hypertension, bilateral: Secondary | ICD-10-CM | POA: Diagnosis not present

## 2021-04-09 ENCOUNTER — Other Ambulatory Visit: Payer: Self-pay | Admitting: Nurse Practitioner

## 2021-04-09 DIAGNOSIS — I10 Essential (primary) hypertension: Secondary | ICD-10-CM

## 2021-04-13 ENCOUNTER — Other Ambulatory Visit: Payer: Self-pay

## 2021-04-13 DIAGNOSIS — I10 Essential (primary) hypertension: Secondary | ICD-10-CM

## 2021-04-13 MED ORDER — LISINOPRIL 20 MG PO TABS: ORAL_TABLET | ORAL | 0 refills | Status: DC

## 2021-04-13 MED ORDER — AMLODIPINE BESYLATE 5 MG PO TABS: ORAL_TABLET | ORAL | 0 refills | Status: DC

## 2021-05-20 IMAGING — DX DG PORTABLE PELVIS
1 series · 1 of 1 positions shown · non-contrast
Comparison: No prior.

CLINICAL DATA: Status post total left hip replacement.

EXAM:
PORTABLE PELVIS 1-2 VIEWS

[pelvis ap]
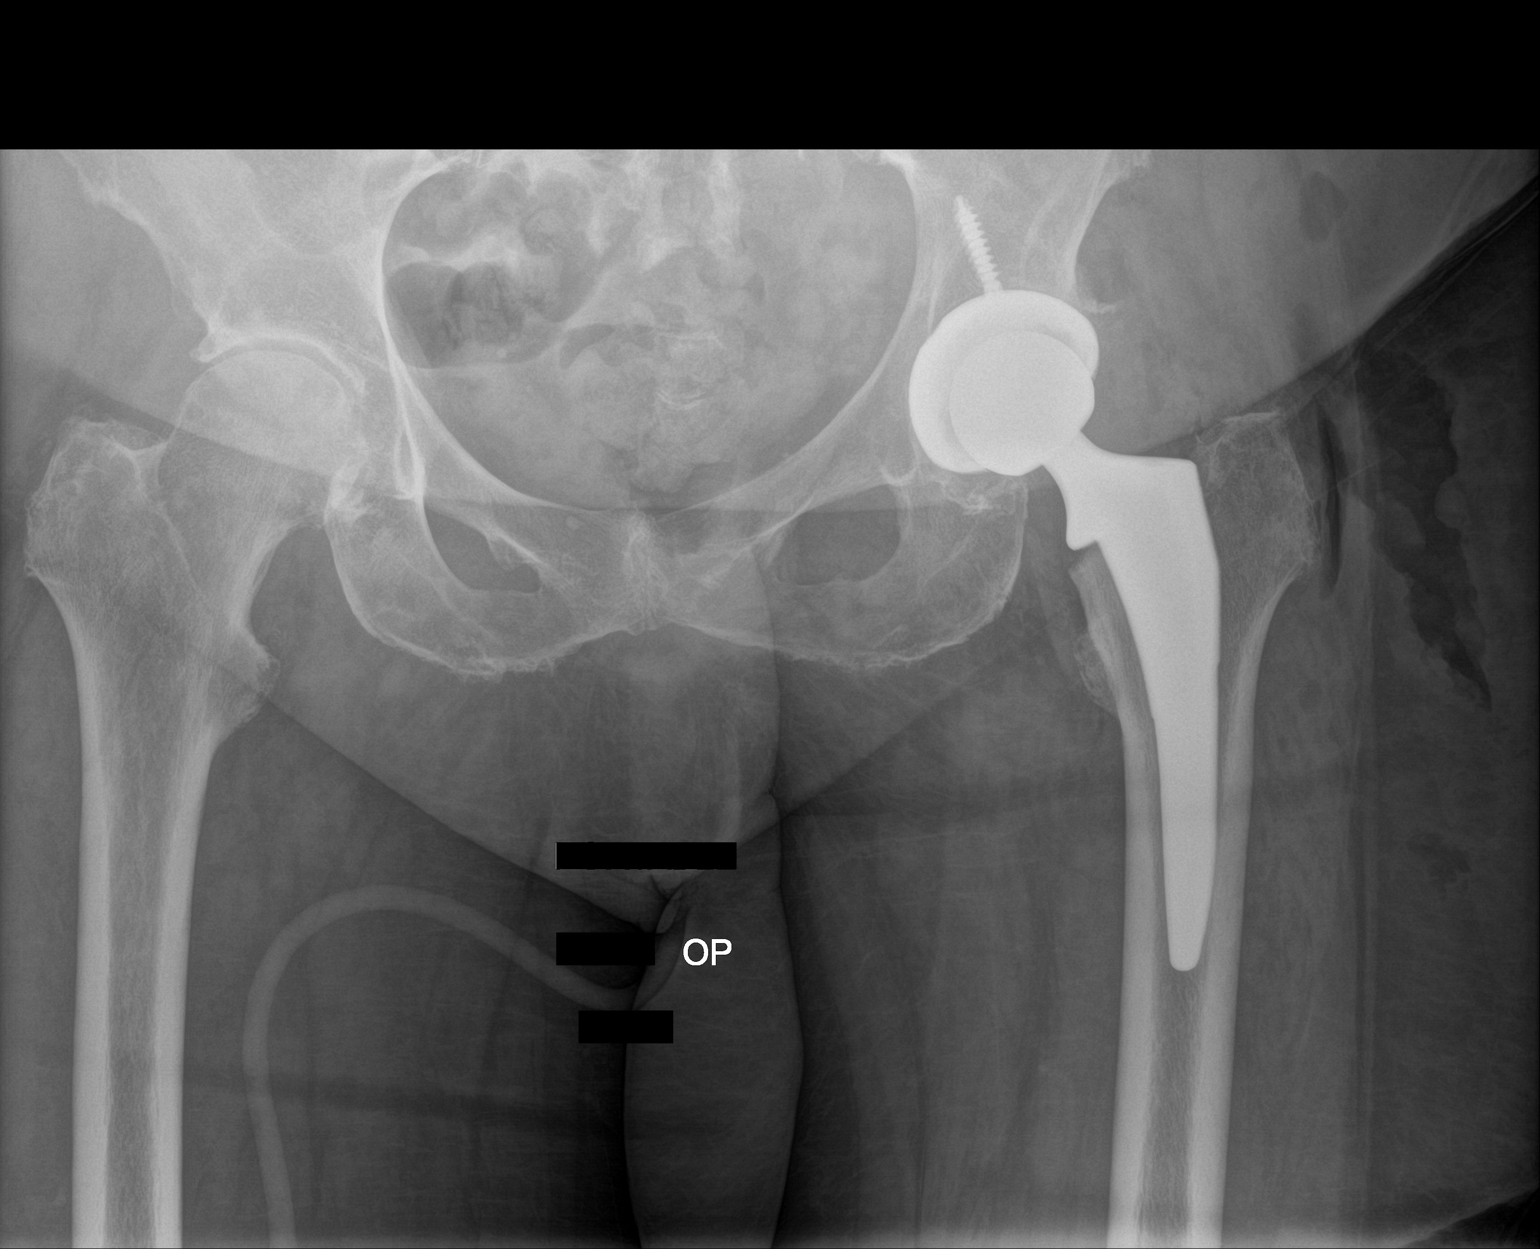

[1 of 1 positions shown; findings below may reference images not displayed]

FINDINGS: Total left hip replacement. Hardware intact. Anatomic alignment.
Diffuse osteopenia. Degenerative change right hip. No acute bony
abnormality identified.
IMPRESSION: Total left hip replacement. Hardware intact. Anatomic alignment. No
acute abnormality.

## 2021-05-28 DIAGNOSIS — Z23 Encounter for immunization: Secondary | ICD-10-CM | POA: Diagnosis not present

## 2021-05-28 DIAGNOSIS — L03114 Cellulitis of left upper limb: Secondary | ICD-10-CM | POA: Diagnosis not present

## 2021-05-28 DIAGNOSIS — S51812A Laceration without foreign body of left forearm, initial encounter: Secondary | ICD-10-CM | POA: Diagnosis not present

## 2021-06-15 ENCOUNTER — Ambulatory Visit: Payer: Medicare PPO | Admitting: Nurse Practitioner

## 2021-06-16 ENCOUNTER — Other Ambulatory Visit: Payer: Self-pay

## 2021-06-16 DIAGNOSIS — I10 Essential (primary) hypertension: Secondary | ICD-10-CM

## 2021-06-16 MED ORDER — LISINOPRIL 20 MG PO TABS: ORAL_TABLET | ORAL | 0 refills | Status: DC

## 2021-06-16 MED ORDER — AMLODIPINE BESYLATE 5 MG PO TABS: ORAL_TABLET | ORAL | 0 refills | Status: DC

## 2021-06-17 ENCOUNTER — Other Ambulatory Visit: Payer: Self-pay

## 2021-06-17 ENCOUNTER — Ambulatory Visit (INDEPENDENT_AMBULATORY_CARE_PROVIDER_SITE_OTHER): Payer: Medicare PPO | Admitting: Nurse Practitioner

## 2021-06-17 ENCOUNTER — Encounter: Payer: Self-pay | Admitting: Nurse Practitioner

## 2021-06-17 VITALS — BP 138/80 | HR 92 | Temp 98.1°F | Ht 68.0 in | Wt 282.0 lb

## 2021-06-17 DIAGNOSIS — I10 Essential (primary) hypertension: Secondary | ICD-10-CM

## 2021-06-17 DIAGNOSIS — W19XXXD Unspecified fall, subsequent encounter: Secondary | ICD-10-CM

## 2021-06-17 DIAGNOSIS — E2839 Other primary ovarian failure: Secondary | ICD-10-CM

## 2021-06-17 DIAGNOSIS — Z6841 Body Mass Index (BMI) 40.0 and over, adult: Secondary | ICD-10-CM | POA: Diagnosis not present

## 2021-06-17 MED ORDER — AMLODIPINE BESYLATE 5 MG PO TABS: ORAL_TABLET | ORAL | 1 refills | Status: DC

## 2021-06-17 MED ORDER — LISINOPRIL 20 MG PO TABS: ORAL_TABLET | ORAL | 1 refills | Status: DC

## 2021-06-17 NOTE — Progress Notes (Signed)
I,Tianna Badgett,acting as a Education administrator for [employer], FNP.,have documented all relevant documentation on the behalf of Minette Brine, FNP,as directed by  Minette Brine, FNP while in the presence of Minette Brine, [place].  This visit occurred during the SARS-CoV-2 public health emergency.  Safety protocols were in place, including screening questions prior to the visit, additional usage of staff PPE, and extensive cleaning of exam room while observing appropriate contact time as indicated for disinfecting solutions.  Subjective:     Patient ID: Linda Allen , female    DOB: 02/10/1945 , 76 y.o.   MRN: [id number]   Chief Complaint  Patient presents with   Hypertension    HPI  Patient here for a blood pressure f/u. She was treated for cellulitis for a cut to her left arm and was treated with an antibiotic and had tdap while in [place]. Just came back last night.  She has already taken her medication this am. She fell getting out of the shower and hit her left back (had oxycodone and muscle relaxers from a previous surgery). Her pain is more tolerable and she was constipated.   Hypertension This is a chronic problem. The current episode started more than 1 year ago. The problem is uncontrolled. Pertinent negatives include no anxiety, blurred vision, chest pain, headaches, peripheral edema or shortness of breath. There are no associated agents to hypertension. Risk factors for coronary artery disease include obesity, sedentary lifestyle and post-menopausal state. Past treatments include nothing. There are no compliance problems.  There is no history of angina, kidney disease or CVA. There is no history of chronic renal disease.     Past Medical History:  Diagnosis Date   Allergy    Angio-edema    Arthritis    L hip   Atypical mole 01/21/2014   SEBACECIOUS ATYPIA RIGHT SCALP = MOHS   Heart murmur    Hypertension    Scleroderma (HCC)    Urticaria      Family History  Problem  Relation Age of Onset   CVA Mother    Parkinson's disease Father    CVA Father    Heart attack Father    Eczema Sister    Ulcerative colitis Brother    Heart failure Paternal Grandmother      Current Outpatient Medications:    amLODipine (NORVASC) 5 MG tablet, Take 1 tablet by mouth daily, Disp: 90 tablet, Rfl: 1   lisinopril (ZESTRIL) 20 MG tablet, TAKE 1 TABLET(20 MG) BY MOUTH DAILY, Disp: 90 tablet, Rfl: 1   Allergies  Allergen Reactions   Bee Venom Shortness Of Breath   Ivp Dye [Iodinated Contrast Media] Shortness Of Breath and Rash   Other Rash    Contrast dye   Penicillins Hives and Rash    Tolerated Cephalosporin Date: 07/09/19.       Review of Systems  Constitutional: Negative.   Eyes:  Negative for blurred vision.  Respiratory: Negative.  Negative for shortness of breath.   Cardiovascular: Negative.  Negative for chest pain.  Gastrointestinal: Negative.   Musculoskeletal:  Positive for back pain.  Neurological: Negative.  Negative for headaches.     Today's Vitals   06/17/21 1004  BP: 138/80  Pulse: 92  Temp: 98.1 F (36.7 C)  TempSrc: Oral  Weight: 282 lb (127.9 kg)  Height: 5' 8"  (1.727 m)   Body mass index is 42.88 kg/m.  Wt Readings from Last 3 Encounters:  06/17/21 282 lb (127.9 kg)  10/15/20 278 lb 3.2  oz (126.2 kg)  10/15/20 278 lb 3.2 oz (126.2 kg)    Objective:  Physical Exam Constitutional:      General: She is not in acute distress.    Appearance: Normal appearance. She is obese.  Cardiovascular:     Rate and Rhythm: Normal rate and regular rhythm.     Pulses: Normal pulses.     Heart sounds: Murmur (3/6) heard.  Pulmonary:     Effort: Pulmonary effort is normal. No respiratory distress.     Breath sounds: Normal breath sounds. No wheezing.  Musculoskeletal:        General: No swelling or tenderness. Normal range of motion.     Cervical back: Normal range of motion and neck supple.  Skin:    General: Skin is warm and dry.      Capillary Refill: Capillary refill takes less than 2 seconds.  Neurological:     General: No focal deficit present.     Mental Status: She is alert and oriented to person, place, and time.     Cranial Nerves: No cranial nerve deficit.     Motor: No weakness.  Psychiatric:        Mood and Affect: Mood normal.        Behavior: Behavior normal.        Thought Content: Thought content normal.        Judgment: Judgment normal.         Assessment And Plan:     1. Essential hypertension Comments: Blood pressure is fairly controlled. Continue current medications and low salt diet - BMP8+EGFR  2. Class 3 severe obesity due to excess calories with serious comorbidity and body mass index (BMI) of 40.0 to 44.9 in adult [facility]) Comments: She is encouraged to strive for BMI less than 30 to decrease cardiac risk. Advised to aim for at least 150 minutes of exercise per week.  3. Decreased estrogen level - DG Bone Density; Future  4. Fall, subsequent encounter Comments: Had fall out of her daughters bath tub last week. She did have on shower shoes. Encouraged to continue with fall safety      Patient was given opportunity to ask questions. Patient verbalized understanding of the plan and was able to repeat key elements of the plan. All questions were answered to their satisfaction.  Minette Brine, FNP   I, Minette Brine, FNP, have reviewed all documentation for this visit. The documentation on 06/17/21 for the exam, diagnosis, procedures, and orders are all accurate and complete.   IF YOU HAVE BEEN REFERRED TO A SPECIALIST, IT MAY TAKE 1-2 WEEKS TO SCHEDULE/PROCESS THE REFERRAL. IF YOU HAVE NOT HEARD FROM US/SPECIALIST IN TWO WEEKS, PLEASE GIVE [place] A CALL AT [phone].   THE PATIENT IS ENCOURAGED TO PRACTICE SOCIAL DISTANCING DUE TO THE COVID-19 PANDEMIC.

## 2021-06-17 NOTE — Patient Instructions (Signed)
Hypertension, Adult High blood pressure (hypertension) is when the force of blood pumping through the arteries is too strong. The arteries are the blood vessels that carry blood from the heart throughout the body. Hypertension forces the heart to work harder to pump blood and may cause arteries to become narrow or stiff. Untreated or uncontrolled hypertension can lead to a heart attack, heart failure, a stroke, kidney disease, and other problems. A blood pressure reading consists of a higher number over a lower number. Ideally, your blood pressure should be below 120/80. The first ("top") number is called the systolic pressure. It is a measure of the pressure in your arteries as your heart beats. The second ("bottom") number is called the diastolic pressure. It is a measure of the pressure in your arteries as the heart relaxes. What are the causes? The exact cause of this condition is not known. There are some conditions that result in high blood pressure. What increases the risk? Certain factors may make you more likely to develop high blood pressure. Some of these risk factors are under your control, including: Smoking. Not getting enough exercise or physical activity. Being overweight. Having too much fat, sugar, calories, or salt (sodium) in your diet. Drinking too much alcohol. Other risk factors include: Having a personal history of heart disease, diabetes, high cholesterol, or kidney disease. Stress. Having a family history of high blood pressure and high cholesterol. Having obstructive sleep apnea. Age. The risk increases with age. What are the signs or symptoms? High blood pressure may not cause symptoms. Very high blood pressure (hypertensive crisis) may cause: Headache. Fast or irregular heartbeats (palpitations). Shortness of breath. Nosebleed. Nausea and vomiting. Vision changes. Severe chest pain, dizziness, and seizures. How is this diagnosed? This condition is diagnosed by  measuring your blood pressure while you are seated, with your arm resting on a flat surface, your legs uncrossed, and your feet flat on the floor. The cuff of the blood pressure monitor will be placed directly against the skin of your upper arm at the level of your heart. Blood pressure should be measured at least twice using the same arm. Certain conditions can cause a difference in blood pressure between your right and left arms. If you have a high blood pressure reading during one visit or you have normal blood pressure with other risk factors, you may be asked to: Return on a different day to have your blood pressure checked again. Monitor your blood pressure at home for 1 week or longer. If you are diagnosed with hypertension, you may have other blood or imaging tests to help your health care provider understand your overall risk for other conditions. How is this treated? This condition is treated by making healthy lifestyle changes, such as eating healthy foods, exercising more, and reducing your alcohol intake. You may be referred for counseling on a healthy diet and physical activity. Your health care provider may prescribe medicine if lifestyle changes are not enough to get your blood pressure under control and if: Your systolic blood pressure is above 130. Your diastolic blood pressure is above 80. Your personal target blood pressure may vary depending on your medical conditions, your age, and other factors. Follow these instructions at home: Eating and drinking  Eat a diet that is high in fiber and potassium, and low in sodium, added sugar, and fat. An example of this eating plan is called the DASH diet. DASH stands for Dietary Approaches to Stop Hypertension. To eat this way: Eat   plenty of fresh fruits and vegetables. Try to fill one half of your plate at each meal with fruits and vegetables. Eat whole grains, such as whole-wheat pasta, brown rice, or whole-grain bread. Fill about one  fourth of your plate with whole grains. Eat or drink low-fat dairy products, such as skim milk or low-fat yogurt. Avoid fatty cuts of meat, processed or cured meats, and poultry with skin. Fill about one fourth of your plate with lean proteins, such as fish, chicken without skin, beans, eggs, or tofu. Avoid pre-made and processed foods. These tend to be higher in sodium, added sugar, and fat. Reduce your daily sodium intake. Many people with hypertension should eat less than 1,500 mg of sodium a day. Do not drink alcohol if: Your health care provider tells you not to drink. You are pregnant, may be pregnant, or are planning to become pregnant. If you drink alcohol: Limit how much you have to: 0-1 drink a day for women. 0-2 drinks a day for men. Know how much alcohol is in your drink. In the [place] one drink equals one 12 oz bottle of beer (355 mL), one 5 oz glass of wine (148 mL), or one 1 oz glass of hard liquor (44 mL). Lifestyle  Work with your health care provider to maintain a healthy body weight or to lose weight. Ask what an ideal weight is for you. Get at least 30 minutes of exercise that causes your heart to beat faster (aerobic exercise) most days of the week. Activities may include walking, swimming, or biking. Include exercise to strengthen your muscles (resistance exercise), such as Pilates or lifting weights, as part of your weekly exercise routine. Try to do these types of exercises for 30 minutes at least 3 days a week. Do not use any products that contain nicotine or tobacco. These products include cigarettes, chewing tobacco, and vaping devices, such as e-cigarettes. If you need help quitting, ask your health care provider. Monitor your blood pressure at home as told by your health care provider. Keep all follow-up visits. This is important. Medicines Take over-the-counter and prescription medicines only as told by your health care provider. Follow directions carefully. Blood  pressure medicines must be taken as prescribed. Do not skip doses of blood pressure medicine. Doing this puts you at risk for problems and can make the medicine less effective. Ask your health care provider about side effects or reactions to medicines that you should watch for. Contact a health care provider if you: Think you are having a reaction to a medicine you are taking. Have headaches that keep coming back (recurring). Feel dizzy. Have swelling in your ankles. Have trouble with your vision. Get help right away if you: Develop a severe headache or confusion. Have unusual weakness or numbness. Feel faint. Have severe pain in your chest or abdomen. Vomit repeatedly. Have trouble breathing. These symptoms may be an emergency. Get help right away. Call 911. Do not wait to see if the symptoms will go away. Do not drive yourself to the hospital. Summary Hypertension is when the force of blood pumping through your arteries is too strong. If this condition is not controlled, it may put you at risk for serious complications. Your personal target blood pressure may vary depending on your medical conditions, your age, and other factors. For most people, a normal blood pressure is less than 120/80. Hypertension is treated with lifestyle changes, medicines, or a combination of both. Lifestyle changes include losing weight, eating a healthy,   low-sodium diet, exercising more, and limiting alcohol. This information is not intended to replace advice given to you by your health care provider. Make sure you discuss any questions you have with your health care provider. Document Revised: 11/03/2020 Document Reviewed: 11/03/2020 Elsevier Patient Education  2023 Elsevier Inc.  

## 2021-06-18 LAB — BMP8+EGFR
BUN/Creatinine Ratio: 27 (ref 12–28)
BUN: 31 mg/dL — ABNORMAL HIGH (ref 8–27)
CO2: 23 mmol/L (ref 20–29)
Calcium: 10 mg/dL (ref 8.7–10.3)
Chloride: 98 mmol/L (ref 96–106)
Creatinine, Ser: 1.14 mg/dL — ABNORMAL HIGH (ref 0.57–1.00)
Glucose: 92 mg/dL (ref 70–99)
Potassium: 5 mmol/L (ref 3.5–5.2)
Sodium: 136 mmol/L (ref 134–144)
eGFR: 51 mL/min/{1.73_m2} — ABNORMAL LOW (ref >59)

## 2021-07-15 DIAGNOSIS — R21 Rash and other nonspecific skin eruption: Secondary | ICD-10-CM | POA: Diagnosis not present

## 2021-07-15 DIAGNOSIS — Z880 Allergy status to penicillin: Secondary | ICD-10-CM | POA: Diagnosis not present

## 2021-08-11 ENCOUNTER — Other Ambulatory Visit: Payer: Self-pay

## 2021-08-11 DIAGNOSIS — I10 Essential (primary) hypertension: Secondary | ICD-10-CM

## 2021-08-11 MED ORDER — AMLODIPINE BESYLATE 5 MG PO TABS: ORAL_TABLET | ORAL | 1 refills | Status: DC

## 2021-08-11 MED ORDER — LISINOPRIL 20 MG PO TABS: ORAL_TABLET | ORAL | 1 refills | Status: DC

## 2021-08-31 DIAGNOSIS — M19011 Primary osteoarthritis, right shoulder: Secondary | ICD-10-CM | POA: Diagnosis not present

## 2021-08-31 DIAGNOSIS — M25511 Pain in right shoulder: Secondary | ICD-10-CM | POA: Diagnosis not present

## 2021-09-15 DIAGNOSIS — M79645 Pain in left finger(s): Secondary | ICD-10-CM | POA: Diagnosis not present

## 2021-09-16 DIAGNOSIS — R2232 Localized swelling, mass and lump, left upper limb: Secondary | ICD-10-CM | POA: Diagnosis not present

## 2021-09-17 IMAGING — CT CT SHOULDER*L* W/O CM
1 series · 12 of 14 positions shown, 15 images · non-contrast
Comparison: None.

CLINICAL DATA: History of reversed total shoulder arthroplasty 3
weeks ago. Left shoulder pain for 1 week.

EXAM:
CT OF THE UPPER LEFT EXTREMITY WITHOUT CONTRAST
TECHNIQUE: Multidetector CT imaging of the upper left extremity was performed
according to the standard protocol.

[Series 3: soft tissue (person_name) · axial · 0.57mm/px · z∈[-161,-7]mm · 12 of 91 slices shown, 15 images]
[im 7/91  soft-tissue]
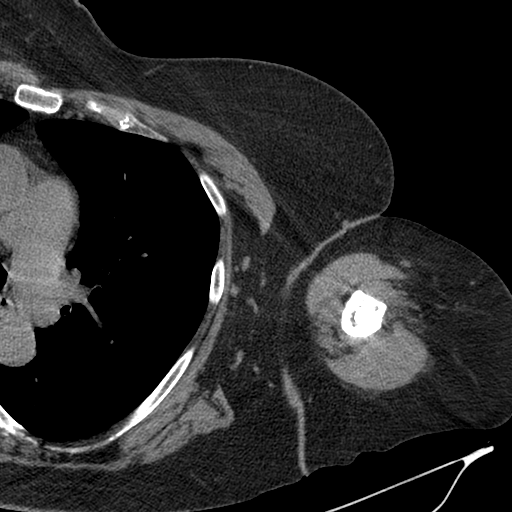
[im 7/91  bone]
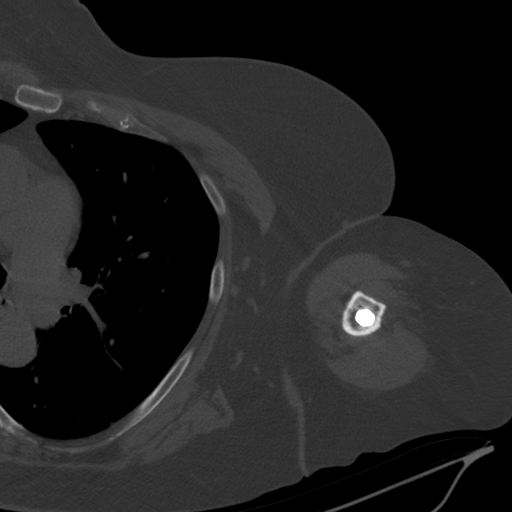
[im 14/91  bone]
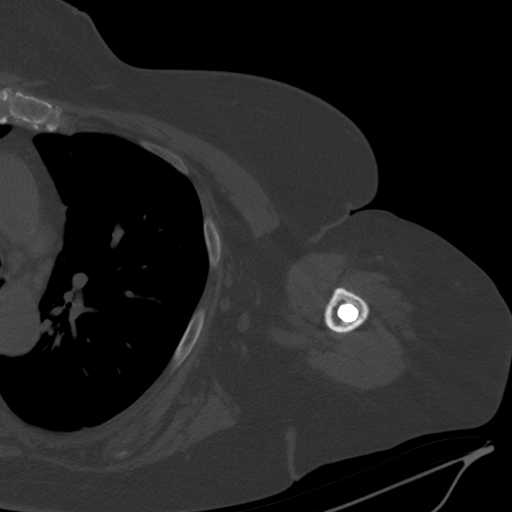
[im 21/91  bone]
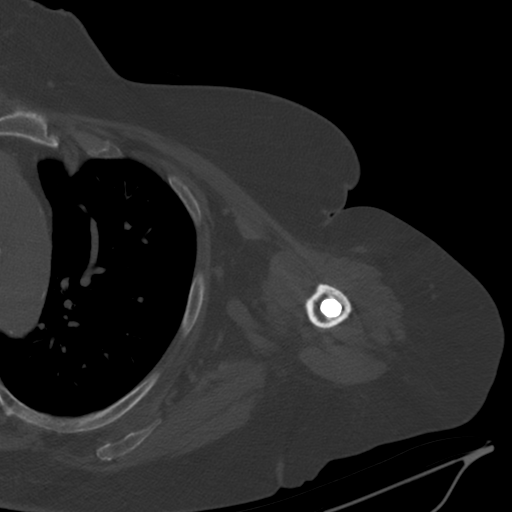
[im 28/91  bone]
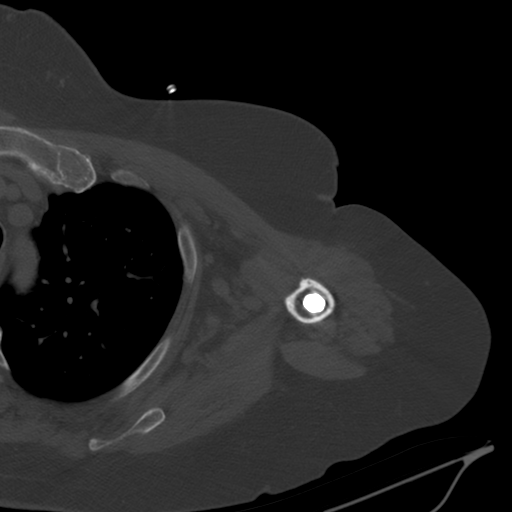
[im 35/91  soft-tissue]
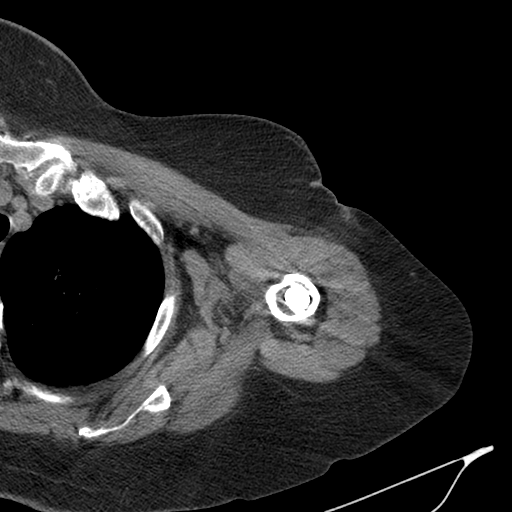
[im 35/91  bone]
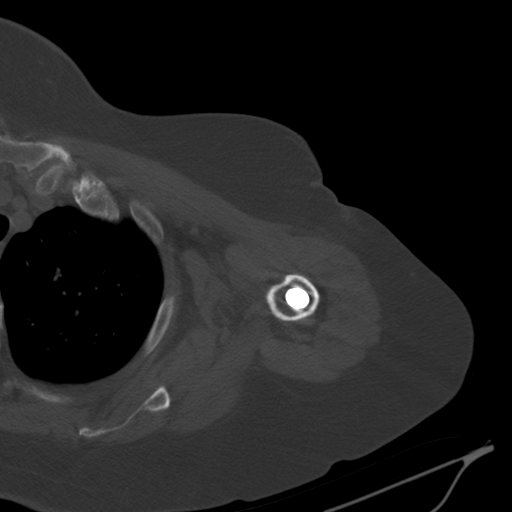
[im 42/91  bone]
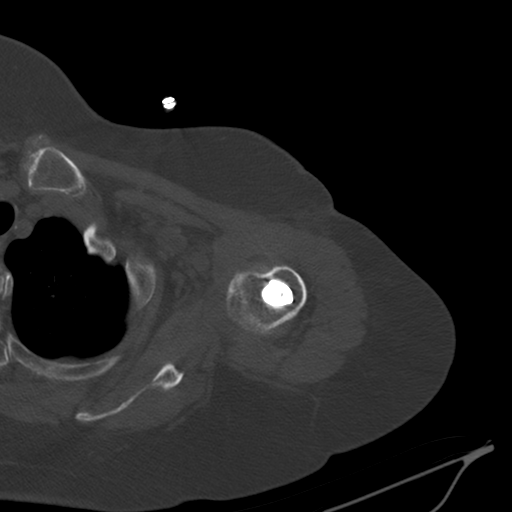
[im 49/91  bone]
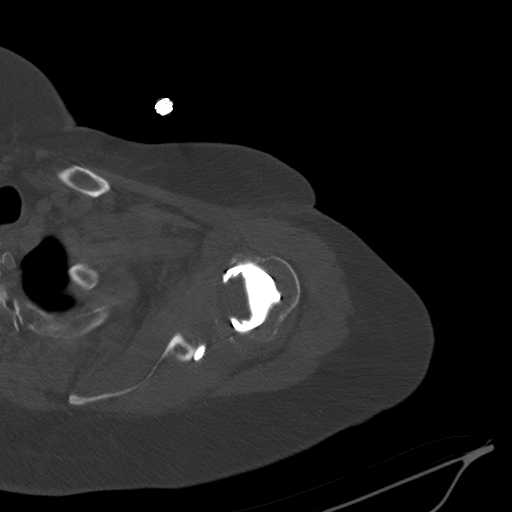
[im 56/91  bone]
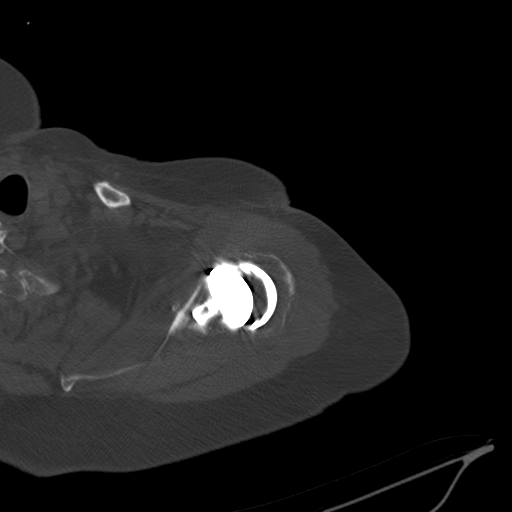
[im 63/91  soft-tissue]
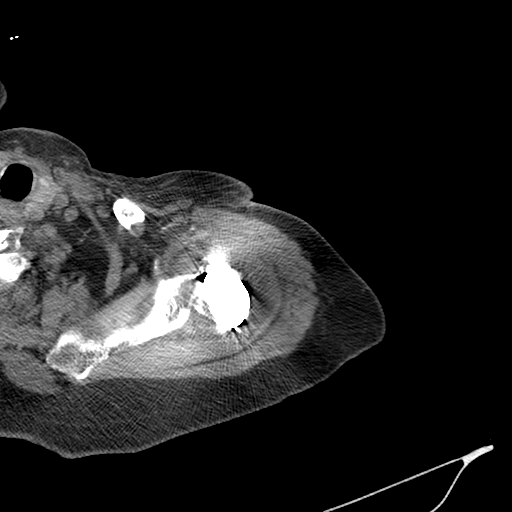
[im 63/91  bone]
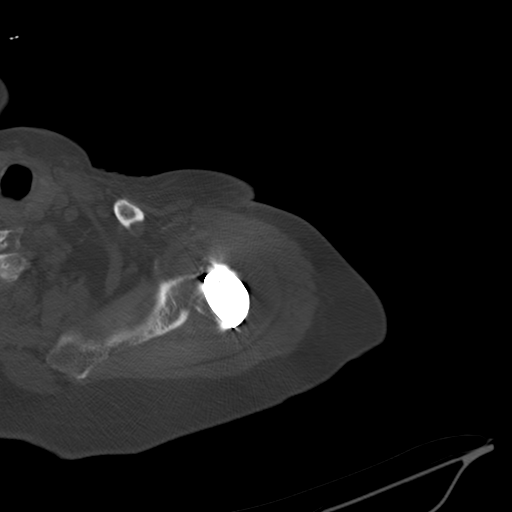
[im 70/91  bone]
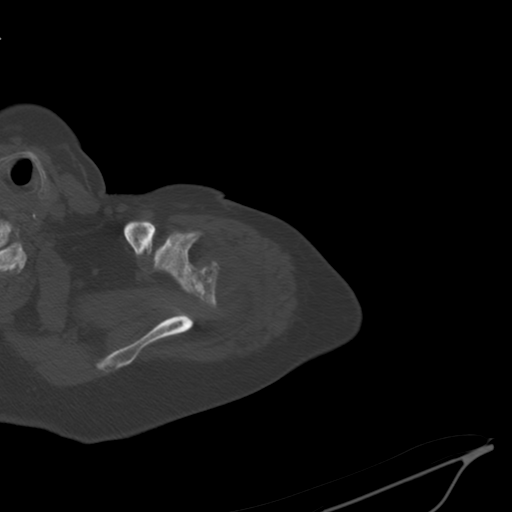
[im 77/91  bone]
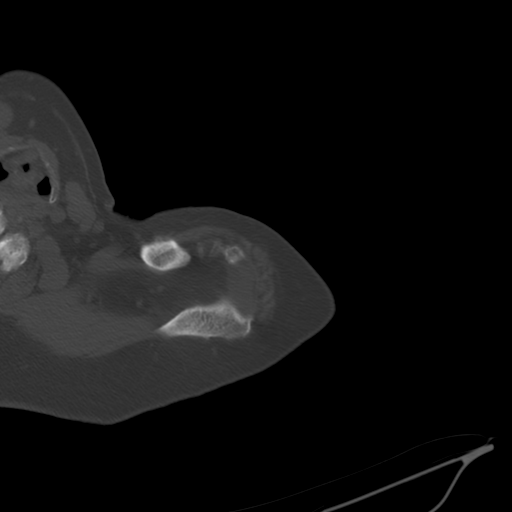
[im 84/91  bone]
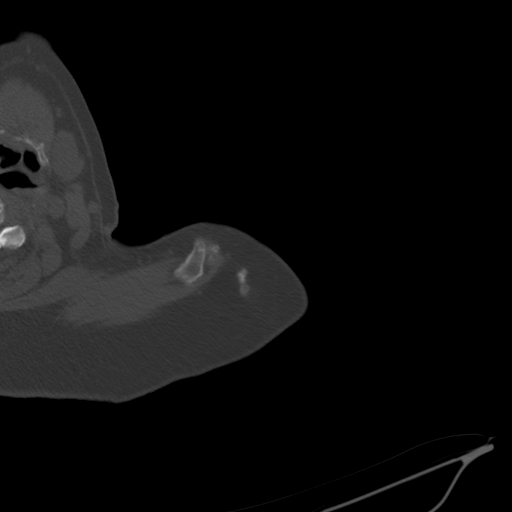

[12 of 14 positions shown; findings below may reference images not displayed]

FINDINGS: The reversed total shoulder arthroplasty components appears to be in
good position. No complicating features are identified. I do not see
any evidence of a periprosthetic fracture. The fixating glenoid
screws are in place. No screw fractures. No glenoid or scapular
fracture is identified.

The AC joint is intact. Moderate degenerative changes. There is some
inferior spurring from the acromion.

The surrounding shoulder musculature is unremarkable. No obvious
muscle tear or intramuscular hematoma. No worrisome periarticular
fluid collections.

The visualized left ribs are intact and the visualized left lung is
clear.
IMPRESSION: 1. Intact reversed total shoulder arthroplasty components without
complicating features.
2. Moderate degenerative changes at the AC joint.
3. No obvious muscle tear or intramuscular hematoma.

## 2021-09-24 ENCOUNTER — Other Ambulatory Visit: Payer: Self-pay

## 2021-09-24 DIAGNOSIS — Z01818 Encounter for other preprocedural examination: Secondary | ICD-10-CM

## 2021-09-27 ENCOUNTER — Other Ambulatory Visit: Payer: Medicare PPO

## 2021-09-27 DIAGNOSIS — Z01818 Encounter for other preprocedural examination: Secondary | ICD-10-CM | POA: Diagnosis not present

## 2021-09-27 LAB — CBC WITH DIFFERENTIAL/PLATELET
Basophils Absolute: 0 10*3/uL (ref 0.0–0.2)
Basos: 1 %
EOS (ABSOLUTE): 0.2 10*3/uL (ref 0.0–0.4)
Eos: 4 %
Hematocrit: 39 % (ref 34.0–46.6)
Hemoglobin: 13.3 g/dL (ref 11.1–15.9)
Immature Grans (Abs): 0 10*3/uL (ref 0.0–0.1)
Immature Granulocytes: 0 %
Lymphocytes Absolute: 1.7 10*3/uL (ref 0.7–3.1)
Lymphs: 34 %
MCH: 33.3 pg — ABNORMAL HIGH (ref 26.6–33.0)
MCHC: 34.1 g/dL (ref 31.5–35.7)
MCV: 98 fL — ABNORMAL HIGH (ref 79–97)
Monocytes Absolute: 0.5 10*3/uL (ref 0.1–0.9)
Monocytes: 10 %
Neutrophils Absolute: 2.6 10*3/uL (ref 1.4–7.0)
Neutrophils: 51 %
Platelets: 300 10*3/uL (ref 150–450)
RBC: 3.99 x10E6/uL (ref 3.77–5.28)
RDW: 14.6 % (ref 11.7–15.4)
WBC: 5.1 10*3/uL (ref 3.4–10.8)

## 2021-10-06 ENCOUNTER — Ambulatory Visit (INDEPENDENT_AMBULATORY_CARE_PROVIDER_SITE_OTHER): Payer: Medicare PPO | Admitting: Nurse Practitioner

## 2021-10-06 ENCOUNTER — Encounter: Payer: Self-pay | Admitting: Nurse Practitioner

## 2021-10-06 VITALS — BP 138/80 | HR 79 | Temp 98.4°F | Ht 68.0 in | Wt 265.8 lb

## 2021-10-06 DIAGNOSIS — Z6841 Body Mass Index (BMI) 40.0 and over, adult: Secondary | ICD-10-CM

## 2021-10-06 DIAGNOSIS — Z01818 Encounter for other preprocedural examination: Secondary | ICD-10-CM

## 2021-10-06 DIAGNOSIS — E7800 Pure hypercholesterolemia, unspecified: Secondary | ICD-10-CM

## 2021-10-06 DIAGNOSIS — I10 Essential (primary) hypertension: Secondary | ICD-10-CM | POA: Diagnosis not present

## 2021-10-06 DIAGNOSIS — Z0000 Encounter for general adult medical examination without abnormal findings: Secondary | ICD-10-CM

## 2021-10-06 DIAGNOSIS — R011 Cardiac murmur, unspecified: Secondary | ICD-10-CM

## 2021-10-06 LAB — POCT URINALYSIS DIPSTICK
Bilirubin, UA: NEGATIVE
Blood, UA: NEGATIVE
Glucose, UA: NEGATIVE
Ketones, UA: NEGATIVE
Nitrite, UA: NEGATIVE
Protein, UA: NEGATIVE
Spec Grav, UA: >=1.03 — AB (ref 1.010–1.025)
Urobilinogen, UA: 0.2 E.U./dL
pH, UA: 6 (ref 5.0–8.0)

## 2021-10-06 NOTE — Progress Notes (Signed)
I,Tianna Badgett,acting as a Education administrator for [employer], FNP.,have documented all relevant documentation on the behalf of Minette Brine, FNP,as directed by  Minette Brine, FNP while in the presence of Minette Brine, [place].  Subjective:     Patient ID: Linda Allen , female    DOB: Nov 19, 1945 , 76 y.o.   MRN: [id number]   Chief Complaint  Patient presents with   Annual Exam    HPI  Patient presents today for HM. She is also here for preop EKG for her right shoulder replacement.   Wt Readings from Last 3 Encounters: 10/06/21 : 265 lb 12.8 oz (120.6 kg) 06/17/21 : 282 lb (127.9 kg) 10/15/20 : 278 lb 3.2 oz (126.2 kg)  She is exercising by walking daily 2.5 miles. And eating more fruits, vegetables, fish and chicken. She is focusing on portion control.  She is no longer drinking alcohol     Past Medical History:  Diagnosis Date   Allergy    Angio-edema    Arthritis    L hip   Atypical mole 01/21/2014   SEBACECIOUS ATYPIA RIGHT SCALP = MOHS   Heart murmur    Hypertension    Scleroderma (HCC)    Urticaria      Family History  Problem Relation Age of Onset   CVA Mother    Parkinson's disease Father    CVA Father    Heart attack Father    Eczema Sister    Ulcerative colitis Brother    Heart failure Paternal Grandmother      Current Outpatient Medications:    amLODipine (NORVASC) 5 MG tablet, Take 1 tablet by mouth daily, Disp: 90 tablet, Rfl: 1   lisinopril (ZESTRIL) 20 MG tablet, TAKE 1 TABLET(20 MG) BY MOUTH DAILY, Disp: 90 tablet, Rfl: 1   Allergies  Allergen Reactions   Bee Venom Shortness Of Breath   Ivp Dye [Iodinated Contrast Media] Shortness Of Breath and Rash   Other Rash    Contrast dye   Penicillins Hives and Rash    Tolerated Cephalosporin Date: 07/09/19.        The patient states she is post menopausal status. No LMP recorded. Patient is postmenopausal.. . Negative for: breast discharge, breast lump(s), breast pain and breast self exam.  Associated symptoms include abnormal vaginal bleeding. Pertinent negatives include abnormal bleeding (hematology), anxiety, decreased libido, depression, difficulty falling sleep, dyspareunia, history of infertility, nocturia, sexual dysfunction, sleep disturbances, urinary incontinence, urinary urgency, vaginal discharge and vaginal itching. Diet regular.The patient states her exercise level is moderate  The patient's tobacco use is:  Social History   Tobacco Use  Smoking Status Former   Years: 10.00   Types: Cigarettes   Quit date: 12/31/1980   Years since quitting: 40.7  Smokeless Tobacco Never  Tobacco Comments   smoked for 10 yeas, d/c 40 y ago  . She has been exposed to passive smoke. The patient's alcohol use is:  Social History   Substance and Sexual Activity  Alcohol Use Yes   Comment: occas.     Review of Systems  Constitutional: Negative.   HENT: Negative.    Eyes: Negative.   Respiratory: Negative.    Cardiovascular: Negative.   Gastrointestinal: Negative.   Endocrine: Negative.   Genitourinary: Negative.   Musculoskeletal: Negative.   Skin: Negative.   Allergic/Immunologic: Negative.   Neurological: Negative.   Hematological: Negative.   Psychiatric/Behavioral: Negative.       Today's Vitals   10/06/21 1125  BP: 138/80  Pulse:  79  Temp: 98.4 F (36.9 C)  TempSrc: Oral  Weight: 265 lb 12.8 oz (120.6 kg)  Height: 5' 8"  (1.727 m)   Body mass index is 40.41 kg/m.  Wt Readings from Last 3 Encounters:  10/06/21 265 lb 12.8 oz (120.6 kg)  06/17/21 282 lb (127.9 kg)  10/15/20 278 lb 3.2 oz (126.2 kg)    Objective:  Physical Exam Constitutional:      General: She is not in acute distress.    Appearance: Normal appearance. She is well-developed. She is obese.  HENT:     Head: Normocephalic and atraumatic.     Right Ear: Hearing, tympanic membrane, ear canal and external ear normal. There is no impacted cerumen.     Left Ear: Hearing, tympanic  membrane, ear canal and external ear normal. There is no impacted cerumen.     Nose:     Comments: Deferred - masked    Mouth/Throat:     Comments: Deferred - masked Eyes:     General: Lids are normal.     Extraocular Movements: Extraocular movements intact.     Conjunctiva/sclera: Conjunctivae normal.     Pupils: Pupils are equal, round, and reactive to light.     Funduscopic exam:    Right eye: No papilledema.        Left eye: No papilledema.  Neck:     Thyroid: No thyroid mass.     Vascular: No carotid bruit.  Cardiovascular:     Rate and Rhythm: Normal rate and regular rhythm.     Pulses: Normal pulses.     Heart sounds: Murmur (3/6) heard.  Pulmonary:     Effort: Pulmonary effort is normal. No respiratory distress.     Breath sounds: Normal breath sounds. No wheezing.  Chest:     Chest wall: No mass.  Breasts:    Tanner Score is 5.     Right: Normal. No mass or tenderness.     Left: Normal. No mass or tenderness.  Abdominal:     General: Abdomen is flat. Bowel sounds are normal. There is no distension.     Palpations: Abdomen is soft.     Tenderness: There is no abdominal tenderness.  Genitourinary:    Rectum: Guaiac result negative.  Musculoskeletal:        General: No swelling or tenderness. Normal range of motion.     Cervical back: Full passive range of motion without pain, normal range of motion and neck supple.     Right lower leg: No edema.     Left lower leg: No edema.  Lymphadenopathy:     Upper Body:     Right upper body: No supraclavicular, axillary or pectoral adenopathy.     Left upper body: No supraclavicular, axillary or pectoral adenopathy.  Skin:    General: Skin is warm and dry.     Capillary Refill: Capillary refill takes less than 2 seconds.  Neurological:     General: No focal deficit present.     Mental Status: She is alert and oriented to person, place, and time.     Cranial Nerves: No cranial nerve deficit.     Sensory: No sensory  deficit.     Motor: No weakness.  Psychiatric:        Mood and Affect: Mood normal.        Behavior: Behavior normal.        Thought Content: Thought content normal.        Judgment: Judgment  normal.         Assessment And Plan:     1. Encounter for annual physical exam Behavior modifications discussed and diet history reviewed.   Pt will continue to exercise regularly and modify diet with low GI, plant based foods and decrease intake of processed foods.  Recommend intake of daily multivitamin, Vitamin D, and calcium.  Recommend mammogram and colonoscopy for preventive screenings, as well as recommend immunizations that include influenza, TDAP, and Shingles  2. Essential hypertension Comments: Blood pressure is fairly controlled, continue medications. EKG done with NSR and HR 65 - POCT Urinalysis Dipstick (81002) - Microalbumin / Creatinine Urine Ratio - EKG 12-Lead - CMP14+EGFR  3. Elevated cholesterol Comments: Diet controlled, continue focusing on low fat diet - CMP14+EGFR - Lipid panel  4. MURMUR Comments: She is advised to call Cardiology to get clearance for her upcoming right shoulder surgery  5. Class 3 severe obesity due to excess calories with serious comorbidity and body mass index (BMI) of 40.0 to 44.9 in adult (HCC) - Hemoglobin A1c  6. Preoperative clearance Comments: She is cleared medically to have her surgery she will call Cardiology for clearance due to her murmur She is encouraged to strive for BMI less than 30 to decrease cardiac risk. Advised to aim for at least 150 minutes of exercise per week.  She did not receive her flu and tdap called to make aware can come back to have done left vm on her phone   Patient was given opportunity to ask questions. Patient verbalized understanding of the plan and was able to repeat key elements of the plan. All questions were answered to their satisfaction.   Minette Brine, FNP   I, Minette Brine, FNP, have reviewed  all documentation for this visit. The documentation on 10/06/21 for the exam, diagnosis, procedures, and orders are all accurate and complete.   THE PATIENT IS ENCOURAGED TO PRACTICE SOCIAL DISTANCING DUE TO THE COVID-19 PANDEMIC.

## 2021-10-06 NOTE — Patient Instructions (Signed)

## 2021-10-07 LAB — LIPID PANEL
Chol/HDL Ratio: 2.3 ratio (ref 0.0–4.4)
Cholesterol, Total: 257 mg/dL — ABNORMAL HIGH (ref 100–199)
HDL: 113 mg/dL (ref >39)
LDL Chol Calc (NIH): 134 mg/dL — ABNORMAL HIGH (ref 0–99)
Triglycerides: 64 mg/dL (ref 0–149)
VLDL Cholesterol Cal: 10 mg/dL (ref 5–40)

## 2021-10-07 LAB — CMP14+EGFR
ALT: 12 IU/L (ref 0–32)
AST: 16 IU/L (ref 0–40)
Albumin/Globulin Ratio: 1.6 (ref 1.2–2.2)
Albumin: 4.6 g/dL (ref 3.8–4.8)
Alkaline Phosphatase: 67 IU/L (ref 44–121)
BUN/Creatinine Ratio: 19 (ref 12–28)
BUN: 22 mg/dL (ref 8–27)
Bilirubin Total: 0.6 mg/dL (ref 0.0–1.2)
CO2: 22 mmol/L (ref 20–29)
Calcium: 10.1 mg/dL (ref 8.7–10.3)
Chloride: 97 mmol/L (ref 96–106)
Creatinine, Ser: 1.18 mg/dL — ABNORMAL HIGH (ref 0.57–1.00)
Globulin, Total: 2.8 g/dL (ref 1.5–4.5)
Glucose: 81 mg/dL (ref 70–99)
Potassium: 4.4 mmol/L (ref 3.5–5.2)
Sodium: 138 mmol/L (ref 134–144)
Total Protein: 7.4 g/dL (ref 6.0–8.5)
eGFR: 48 mL/min/{1.73_m2} — ABNORMAL LOW (ref >59)

## 2021-10-07 LAB — MICROALBUMIN / CREATININE URINE RATIO
Creatinine, Urine: 83.8 mg/dL
Microalb/Creat Ratio: 21 mg/g creat (ref 0–29)
Microalbumin, Urine: 17.3 ug/mL

## 2021-10-07 LAB — HEMOGLOBIN A1C
Est. average glucose Bld gHb Est-mCnc: 111 mg/dL
Hgb A1c MFr Bld: 5.5 % (ref 4.8–5.6)

## 2021-10-13 ENCOUNTER — Telehealth: Payer: Self-pay | Admitting: Cardiovascular Disease

## 2021-10-13 NOTE — Telephone Encounter (Signed)
Primary Cardiologist:[place] Doreatha Lew, MD  Chart reviewed as part of pre-operative protocol coverage. Because of Linda Allen's past medical history and time since last visit, he/she will require a follow-up visit in order to better assess preoperative cardiovascular risk.  Pre-op covering staff: - Please schedule appointment and call patient to inform them (last seen 10/03/19). - Please contact requesting surgeon's office via preferred method (i.e, phone, fax) to inform them of need for appointment prior to surgery.   Emmaline Life, NP-C  10/13/2021, 2:34 PM [address] Office [phone] Fax [phone]

## 2021-10-13 NOTE — Telephone Encounter (Signed)
Pt agreeable to plan of care for in office appt 10/19/21 @ 8:50 with Diona Browner, NP. Pt states she had an EKG done yesterday withy her PCP and they were supposed to fax over a copy to Dr. Audie Box

## 2021-10-13 NOTE — Telephone Encounter (Signed)
   Pre-operative Risk Assessment    Patient Name: Linda Allen  DOB: May 09, 1945 MRN: [id number]      Request for Surgical Clearance    Procedure:   Right shoulder replaced  Date of Surgery:  Clearance TBD                                 Surgeon:  Dr. Remo Lipps [facility] or Practice Name:  [facility] Phone number:  [phone] Fax number:  [phone]   Type of Clearance Requested:   - Medical    Type of Anesthesia:  Not Indicated   Additional requests/questions:   Patient called stating this will be a total shoulder replacement and they will need medical clearance.  Patient stated  she is out of town at ph # [phone] or can text to her mobile.  Signed, Heloise Beecham   10/13/2021, 1:35 PM

## 2021-10-19 ENCOUNTER — Encounter: Payer: Self-pay | Admitting: Nurse Practitioner

## 2021-10-19 ENCOUNTER — Ambulatory Visit: Payer: Medicare PPO | Attending: Nurse Practitioner | Admitting: Nurse Practitioner

## 2021-10-19 VITALS — BP 134/66 | HR 68 | Ht 68.0 in | Wt 263.0 lb

## 2021-10-19 DIAGNOSIS — Z01810 Encounter for preprocedural cardiovascular examination: Secondary | ICD-10-CM | POA: Diagnosis not present

## 2021-10-19 DIAGNOSIS — E782 Mixed hyperlipidemia: Secondary | ICD-10-CM | POA: Diagnosis not present

## 2021-10-19 DIAGNOSIS — I350 Nonrheumatic aortic (valve) stenosis: Secondary | ICD-10-CM

## 2021-10-19 DIAGNOSIS — I10 Essential (primary) hypertension: Secondary | ICD-10-CM | POA: Diagnosis not present

## 2021-10-19 NOTE — Progress Notes (Signed)
Office Visit    Patient Name: Linda Allen Date of Encounter: 10/19/2021  Primary Care Provider:  Minette Brine, [place] Primary Cardiologist:  Evalina Field, MD  Chief Complaint    76 year old female with a history of aortic valve stenosis, hypertension, hyperlipidemia, osteoarthritis, and scleroderma who presents for follow-up related to aortic stenosis and for preoperative cardiac evaluation.  Past Medical History    Past Medical History:  Diagnosis Date   Allergy    Angio-edema    Arthritis    L hip   Atypical mole 01/21/2014   SEBACECIOUS ATYPIA RIGHT SCALP = MOHS   Heart murmur    Hypertension    Scleroderma (HCC)    Urticaria    Past Surgical History:  Procedure Laterality Date   ADENOIDECTOMY     ENDOVENOUS ABLATION SAPHENOUS VEIN W/ LASER Left 09/26/2018   endovenous laser ablation left greater saphenous vein by Ruta Hinds MD    ENDOVENOUS ABLATION SAPHENOUS VEIN W/ LASER Right 10/17/2018   endovenous laser ablation right greater saphenous vein by Ruta Hinds MD    JOINT REPLACEMENT  2011, 2012   both knees   REVERSE SHOULDER ARTHROPLASTY Left 09/20/2019   Procedure: REVERSE SHOULDER ARTHROPLASTY;  Surgeon: Netta Cedars, MD;  Location: [facility];  Service: Orthopedics;  Laterality: Left;   SHOULDER ACROMIOPLASTY Right 2012   TONSILLECTOMY     TOTAL HIP ARTHROPLASTY Left 07/09/2019   Procedure: TOTAL HIP ARTHROPLASTY ANTERIOR APPROACH;  Surgeon: Paralee Cancel, MD;  Location: [facility];  Service: Orthopedics;  Laterality: Left;  70 mins    Allergies  Allergies  Allergen Reactions   Bee Venom Shortness Of Breath   Ivp Dye [Iodinated Contrast Media] Shortness Of Breath and Rash   Other Rash    Contrast dye   Penicillins Hives and Rash    Tolerated Cephalosporin Date: 07/09/19.      History of Present Illness    76 year old female with the above past medical history of aortic valve stenosis, hypertension, hyperlipidemia, osteoarthritis, and  scleroderma.  She has a history of moderate aortic stenosis.  She was last seen in the office on 10/03/2019 and was stable from a cardiac standpoint.  Repeat echocardiogram in 09/2020 revealed EF 60 to 65%, normal LV function, no RWMA, mild concentric LVH, indeterminate diastolic parameters, normal RV systolic function, moderate MAC, moderate aortic stenosis (mean gradient 29 mmHg), mild aortic valve regurgitation, no significant change from prior study.  She presents today for follow-up and for preoperative cardiac evaluation for right shoulder replacement with Dr. Earlie Counts of [facility].  Since her last visit she has been stable from a cardiac standpoint.  She has been working on lifestyle modifications with diet and exercise and has lost almost 20 pounds in the past 3 months.  She denies any dyspnea, edema, dizziness, presyncope, syncope, PND, orthopnea, weight gain.  She is eager to have her shoulder surgery.  Otherwise, she reports feeling well and denies any new concerns today.  Home Medications    Current Outpatient Medications  Medication Sig Dispense Refill   amLODipine (NORVASC) 5 MG tablet Take 1 tablet by mouth daily 90 tablet 1   lisinopril (ZESTRIL) 20 MG tablet TAKE 1 TABLET(20 MG) BY MOUTH DAILY 90 tablet 1   No current facility-administered medications for this visit.     Review of Systems    She denies chest pain, palpitations, dyspnea, pnd, orthopnea, n, v, dizziness, syncope, edema, weight gain, or early satiety. All other systems reviewed and are otherwise  negative except as noted above.   Physical Exam    VS:  BP 134/66 (BP Location: Right Arm, Patient Position: Sitting, Cuff Size: Large)   Pulse 68   Ht _0  (1.727 m)   Wt 263 lb (119.3 kg)   BMI 39.99 kg/m   GEN: Well nourished, well developed, in no acute distress. HEENT: normal. Neck: Supple, no JVD, carotid bruits, or masses. Cardiac: RRR, 3/6 murmur, no rubs, or gallops. No clubbing, cyanosis, edema.   Radials/DP/PT 2+ and equal bilaterally.  Respiratory:  Respirations regular and unlabored, clear to auscultation bilaterally. GI: Soft, nontender, nondistended, BS + x 4. MS: no deformity or atrophy. Skin: warm and dry, no rash. Neuro:  Strength and sensation are intact. Psych: Normal affect.  Accessory Clinical Findings    ECG personally reviewed by me today -NSR, 68 bpm- no acute changes.   Lab Results  Component Value Date   WBC 5.1 09/27/2021   HGB 13.3 09/27/2021   HCT 39.0 09/27/2021   MCV 98 (H) 09/27/2021   PLT 300 09/27/2021   Lab Results  Component Value Date   CREATININE 1.18 (H) 10/06/2021   BUN 22 10/06/2021   NA 138 10/06/2021   K 4.4 10/06/2021   CL 97 10/06/2021   CO2 22 10/06/2021   Lab Results  Component Value Date   ALT 12 10/06/2021   AST 16 10/06/2021   ALKPHOS 67 10/06/2021   BILITOT 0.6 10/06/2021   Lab Results  Component Value Date   CHOL 257 (H) 10/06/2021   HDL 113 10/06/2021   LDLCALC 134 (H) 10/06/2021   TRIG 64 10/06/2021   CHOLHDL 2.3 10/06/2021    Lab Results  Component Value Date   HGBA1C 5.5 10/06/2021    Assessment & Plan   1. Aortic stenosis: Most recent echo in 09/2020 revealed EF 60 to 65%, normal LV function, no RWMA, mild concentric LVH, indeterminate diastolic parameters, normal RV systolic function, moderate MAC, moderate aortic stenosis (mean gradient 29 mmHg), mild aortic valve regurgitation, no significant change from prior study.  Repeat echo for routine monitoring, however, this does not need to preclude shoulder surgery.  2. Hypertension: BP well controlled. Continue current antihypertensive regimen.   3. Hyperlipidemia: LDL was 134 in 09/2021.  He is working on lifestyle modifications with diet and exercise and has lost almost 20 pounds in the past 3 months.  I congratulated her on this and encouraged ongoing efforts.  Monitored and managed per PCP.  4. Preoperative cardiac evaluation: According to the Revised  Cardiac Risk Index (RCRI), her Perioperative Risk of Major Cardiac Event is (%): 0.4. Her Functional Capacity in METs is: 7.99 according to the Duke Activity Status Index (DASI). Therefore, based on ACC/AHA guidelines, patient would be at acceptable risk for the planned procedure without further cardiovascular testing. I will route this recommendation to the requesting party via Epic fax function.  5. Disposition: Follow-up in 1 year, sooner if needed.      Lenna Sciara, NP 10/19/2021, 9:29 AM

## 2021-10-19 NOTE — Patient Instructions (Addendum)
Medication Instructions:  Your physician recommends that you continue on your current medications as directed. Please refer to the Current Medication list given to you today.   *If you need a refill on your cardiac medications before your next appointment, please call your pharmacy*   Lab Work: NONE ordered at this time of appointment   If you have labs (blood work) drawn today and your tests are completely normal, you will receive your results only by: [place] (if you have MyChart) OR A paper copy in the mail If you have any lab test that is abnormal or we need to change your treatment, we will call you to review the results.   Testing/Procedures: Your physician has requested that you have an echocardiogram. Echocardiography is a painless test that uses sound waves to create images of your heart. It provides your doctor with information about the size and shape of your heart and how well your heart's chambers and valves are working. This procedure takes approximately one hour. There are no restrictions for this procedure.      Follow-Up: At [facility], you and your health needs are our priority.  As part of our continuing mission to provide you with exceptional heart care, we have created designated Provider Care Teams.  These Care Teams include your primary Cardiologist (physician) and Advanced Practice Providers (APPs -  Physician Assistants and Nurse Practitioners) who all work together to provide you with the care you need, when you need it.  We recommend signing up for the patient portal called "MyChart".  Sign up information is provided on this After Visit Summary.  MyChart is used to connect with patients for Virtual Visits (Telemedicine).  Patients are able to view lab/test results, encounter notes, upcoming appointments, etc.  Non-urgent messages can be sent to your provider as well.   To learn more about what you can do with MyChart, go to  [email or web address].    Your next appointment:   1 year(s)  The format for your next appointment:   In Person  Provider:   Diona Browner, NP   Other Instructions   Important Information About Sugar

## 2021-11-03 ENCOUNTER — Ambulatory Visit (HOSPITAL_COMMUNITY): Payer: Medicare PPO | Attending: Nurse Practitioner

## 2021-11-03 DIAGNOSIS — I350 Nonrheumatic aortic (valve) stenosis: Secondary | ICD-10-CM

## 2021-11-03 LAB — ECHOCARDIOGRAM COMPLETE
AR max vel: 0.9 cm2
AV Area VTI: 0.98 cm2
AV Area mean vel: 0.82 cm2
AV Mean grad: 31 mmHg
AV Peak grad: 53.3 mmHg
Ao pk vel: 3.65 m/s
Area-P 1/2: 3.87 cm2
S' Lateral: 2.7 cm

## 2021-11-09 ENCOUNTER — Telehealth: Payer: Self-pay

## 2021-11-09 NOTE — Telephone Encounter (Signed)
Spoke with pt. Pt was notified of echo results. Pt will continue her current medication, follow up and plan to repeat echo in 1 year.

## 2021-11-10 ENCOUNTER — Ambulatory Visit: Payer: Medicare PPO | Admitting: Nurse Practitioner

## 2021-11-10 ENCOUNTER — Ambulatory Visit (INDEPENDENT_AMBULATORY_CARE_PROVIDER_SITE_OTHER): Payer: Medicare PPO

## 2021-11-10 VITALS — BP 140/70 | HR 77 | Temp 97.9°F | Ht 66.2 in | Wt 266.0 lb

## 2021-11-10 DIAGNOSIS — Z0000 Encounter for general adult medical examination without abnormal findings: Secondary | ICD-10-CM

## 2021-11-10 NOTE — Progress Notes (Signed)
Subjective:   TOINETTE LACKIE is a 76 y.o. female who presents for Medicare Annual (Subsequent) preventive examination.  Review of Systems     Cardiac Risk Factors include: advanced age (>55mn, >>18women);hypertension;obesity (BMI >30kg/m2)     Objective:    Today's Vitals   11/10/21 1439 11/10/21 1455  BP: (!) 160/78 (!) 140/70  Pulse: 77   Temp: 97.9 F (36.6 C)   TempSrc: Oral   SpO2: 98%   Weight: 266 lb (120.7 kg)   Height: 5' 6.2" (1.681 m)    Body mass index is 42.67 kg/m.     11/10/2021    2:47 PM 10/15/2020   12:24 PM 09/18/2019   10:18 AM 07/09/2019    2:57 PM 06/26/2019    9:11 AM 06/13/2019    3:24 PM 06/21/2018    3:18 PM  Advanced Directives  Does Patient Have a Medical Advance Directive? Yes Yes Yes Yes Yes Yes Yes  Type of AParamedicof [place]Living will H[place]Living will HR[place]Living will HF[place] River GroveLiving will HConecuhLiving will HAm[place]Living will HBufaloLiving will  Does patient want to make changes to medical advance directive?    No - Patient declined   No - Patient declined  Copy of [place] Chart? Yes - validated most recent copy scanned in chart (See row information) Yes - validated most recent copy scanned in chart (See row information)  No - copy requested  No - copy requested     Current Medications (verified) Outpatient Encounter Medications as of 11/10/2021  Medication Sig   amLODipine (NORVASC) 5 MG tablet Take 1 tablet by mouth daily   lisinopril (ZESTRIL) 20 MG tablet TAKE 1 TABLET(20 MG) BY MOUTH DAILY   [DISCONTINUED] ferrous sulfate (FERROUSUL) 325 (65 FE) MG tablet Take 1 tablet (325 mg total) by mouth 3 (three) times daily with meals for 14 days. (Patient not taking: Reported on 09/12/2019)   No facility-administered encounter medications on file as of  11/10/2021.    Allergies (verified) Bee venom, Ivp dye [iodinated contrast media], Other, and Penicillins   History: Past Medical History:  Diagnosis Date   Allergy    Angio-edema    Arthritis    L hip   Atypical mole 01/21/2014   SEBACECIOUS ATYPIA RIGHT SCALP = MOHS   Heart murmur    Hypertension    [place]cleroderma ([place]    Urticaria    Past Surgical History:  Procedure Laterality Date   ADENOIDECTOMY     ENDOVENOUS ABLATION SAPHENOUS VEIN W/ LASER Left 09/26/2018   endovenous laser ablation left greater saphenous vein by CRuta HindsMD    ENDOVENOUS ABLATION SAPHENOUS VEIN W/ LASER Right 10/17/2018   endovenous laser ablation right greater saphenous vein by CRuta HindsMD    JOINT REPLACEMENT  2011, 2012   both knees   REVERSE SHOULDER ARTHROPLASTY Left 09/20/2019   Procedure: REVERSE SHOULDER ARTHROPLASTY;  Surgeon: NNetta Cedars MD;  Location: [facility];  Service: Orthopedics;  Laterality: Left;   SHOULDER ACROMIOPLASTY Right 2012   TONSILLECTOMY     TOTAL HIP ARTHROPLASTY Left 07/09/2019   Procedure: TOTAL HIP ARTHROPLASTY ANTERIOR APPROACH;  Surgeon: OParalee Cancel MD;  Location: [facility];  Service: Orthopedics;  Laterality: Left;  70 mins   Family History  Problem Relation Age of Onset   CVA Mother    Parkinson's disease Father    CVA  Father    Heart attack Father    Eczema Sister    Ulcerative colitis Brother    Heart failure Paternal Grandmother    Social History   Socioeconomic History   Marital status: Married    Spouse name: Not on file   Number of children: 2   Years of education: Not on file   Highest education level: Not on file  Occupational History   Occupation: retired  Tobacco Use   Smoking status: Former    Years: 10.00    Types: Cigarettes    Quit date: 12/31/1980    Years since quitting: 40.8   Smokeless tobacco: Never   Tobacco comments:    smoked for 10 yeas, d/c 40 y ago  Vaping Use   Vaping Use: Never used  Substance and Sexual  Activity   Alcohol use: Yes    Comment: occas.   Drug use: Never   Sexual activity: Yes    Birth control/protection: None    Comment: number of sex partners in the last 12 months  1  Other Topics Concern   Not on file  Social History Narrative   Exercise walking 3 times per week for 30 minutes.      Anticipatory guidance - has living will, full code, but would not want to be on ventilator for prolonged time. organ donor.             Social Determinants of Health   Financial Resource Strain: Low Risk  (11/10/2021)   Overall Financial Resource Strain (CARDIA)    Difficulty of Paying Living Expenses: Not hard at all  Food Insecurity: No Food Insecurity (11/10/2021)   Hunger Vital Sign    Worried About Running Out of Food in the Last Year: Never true    Ran Out of Food in the Last Year: Never true  Transportation Needs: No Transportation Needs (11/10/2021)   PRAPARE - Hydrologist (Medical): No    Lack of Transportation (Non-Medical): No  Physical Activity: Sufficiently Active (11/10/2021)   Exercise Vital Sign    Days of Exercise per Week: 5 days    Minutes of Exercise per Session: 60 min  Stress: No Stress Concern Present (11/10/2021)   [place]    Feeling of Stress : Not at all  Social Connections: Not on file    Tobacco Counseling Counseling given: Not Answered Tobacco comments: smoked for 10 yeas, d/c 40 y ago   Clinical Intake:  Pre-visit preparation completed: Yes  Pain : No/denies pain     Nutritional Status: BMI > 30  Obese Nutritional Risks: None Diabetes: No  How often do you need to have someone help you when you read instructions, pamphlets, or other written materials from your doctor or pharmacy?: 1 - Never  Diabetic? no  Interpreter Needed?: No  Information entered by :: NAllen LPN   Activities of Daily Living    11/10/2021    2:48 PM  In your  present state of health, do you have any difficulty performing the following activities:  Hearing? 0  Vision? 0  Difficulty concentrating or making decisions? 0  Walking or climbing stairs? 0  Dressing or bathing? 0  Doing errands, shopping? 0  Preparing Food and eating ? N  Using the Toilet? N  In the past six months, have you accidently leaked urine? N  Do you have problems with loss of bowel control? N  Managing your  Medications? N  Managing your Finances? N  Housekeeping or managing your Housekeeping? N    Patient Care Team: Minette Brine, FNP as PCP - General (General Practice) O'Neal, Cassie Freer, MD as PCP - Cardiology (Cardiology) [name], MD (Inactive) as Consulting Physician (Dermatology)  Indicate any recent Medical Services you may have received from other than [facility] providers in the past year (date may be approximate).     Assessment:   This is a routine wellness examination for [place].  Hearing/Vision screen Vision Screening - Comments:: Regular eye exams, [facility]  Dietary issues and exercise activities discussed: Current Exercise Habits: Home exercise routine, Type of exercise: walking, Time (Minutes): 60, Frequency (Times/Week): 5, Weekly Exercise (Minutes/Week): 300   Goals Addressed             This Visit's Progress    Patient Stated       11/10/2021, wants to lose more weight       Depression Screen    11/10/2021    2:48 PM 10/06/2021   11:25 AM 10/06/2021   11:17 AM 06/17/2021   10:05 AM 10/15/2020   12:25 PM 10/15/2020   12:04 PM 06/13/2019    3:25 PM  PHQ 2/9 Scores  PHQ - 2 Score 0 0 0 0 0 0 0  PHQ- 9 Score    0       Fall Risk    11/10/2021    2:47 PM 10/06/2021   11:17 AM 06/17/2021   10:06 AM 10/15/2020   12:25 PM 10/15/2020   12:04 PM  [place] in the past year? 0 0 0 0 0  Number falls in past yr: 0 0 0  0  Injury with Fall? 0 0 1  0  Risk for fall due to : Medication side effect No Fall Risks  Medication side  effect   Follow up Falls prevention discussed;Education provided;Falls evaluation completed Falls evaluation completed  Falls evaluation completed;Education provided;Falls prevention discussed     FALL RISK PREVENTION PERTAINING TO THE HOME:  Any stairs in or around the home? Yes  If so, are there any without handrails? No  Home free of loose throw rugs in walkways, pet beds, electrical cords, etc? Yes  Adequate lighting in your home to reduce risk of falls? Yes   ASSISTIVE DEVICES UTILIZED TO PREVENT FALLS:  Life alert? No  Use of a cane, walker or w/c? No  Grab bars in the bathroom? Yes  Shower chair or bench in shower? No  Elevated toilet seat or a handicapped toilet? Yes   TIMED UP AND GO:  Was the test performed? Yes .  Length of time to ambulate 10 feet: 5 sec.   Gait slow and steady without use of assistive device  Cognitive Function:        11/10/2021    2:48 PM 10/15/2020   12:26 PM 06/13/2019    3:25 PM  6CIT Screen  What Year? 0 points 0 points 0 points  What month? 0 points 0 points 0 points  What time? 0 points 0 points 0 points  Count back from 20 0 points 0 points 0 points  Months in reverse 0 points 0 points 0 points  Repeat phrase 0 points 0 points 0 points  Total Score 0 points 0 points 0 points    Immunizations Immunization History  Administered Date(s) Administered   Influenza, High Dose Seasonal PF 10/14/2020   Influenza,inj,Quad PF,6+ Mos 10/15/2012   Influenza-Unspecified 11/20/2015,  10/11/2019   PFIZER(Purple Top)SARS-COV-2 Vaccination 03/06/2019, 04/03/2019, 10/11/2019   Pfizer Covid-19 Vaccine Bivalent Booster 60yr & up 10/13/2020   Pneumococcal-Unspecified 10/10/2012   Tdap 08/10/2009, 06/12/2021   Unspecified SARS-COV-2 Vaccination 10/20/2021    TDAP status: Up to date  Flu Vaccine status: Due, Education has been provided regarding the importance of this vaccine. Advised may receive this vaccine at local pharmacy or Health Dept.  Aware to provide a copy of the vaccination record if obtained from local pharmacy or Health Dept. Verbalized acceptance and understanding.  Pneumococcal vaccine status: Due, Education has been provided regarding the importance of this vaccine. Advised may receive this vaccine at local pharmacy or Health Dept. Aware to provide a copy of the vaccination record if obtained from local pharmacy or Health Dept. Verbalized acceptance and understanding.  Covid-19 vaccine status: Completed vaccines  Qualifies for Shingles Vaccine? Yes   Zostavax completed No   Shingrix Completed?: No.    Education has been provided regarding the importance of this vaccine. Patient has been advised to call insurance company to determine out of pocket expense if they have not yet received this vaccine. Advised may also receive vaccine at local pharmacy or Health Dept. Verbalized acceptance and understanding.  Screening Tests Health Maintenance  Topic Date Due   Zoster Vaccines- Shingrix (1 of 2) Never done   Pneumonia Vaccine 74 Years old (1 - PCV) 07/22/2012   DEXA SCAN  Never done   INFLUENZA VACCINE  08/10/2021   Medicare Annual Wellness (AWV)  10/15/2021   MAMMOGRAM  11/09/2021   COVID-19 Vaccine (6 - Pfizer risk series) 12/15/2021   COLONOSCOPY (Pts 45-441yrInsurance coverage will need to be confirmed)  12/26/2024   TETANUS/TDAP  06/13/2031   Hepatitis C Screening  Completed   HPV VACCINES  Aged Out    Health Maintenance  Health Maintenance Due  Topic Date Due   Zoster Vaccines- Shingrix (1 of 2) Never done   Pneumonia Vaccine [age]Years old (1 - PCV) 07/22/2012   DEXA SCAN  Never done   INFLUENZA VACCINE  08/10/2021   Medicare Annual Wellness (AWV)  10/15/2021   MAMMOGRAM  11/09/2021    Colorectal cancer screening: No longer required.   Mammogram status: scheduled for 11/15/2021  Bone Density status: scheduled for 11/15/2021  Lung Cancer Screening: (Low Dose CT Chest recommended if Age 74-80years, 30 pack-year currently smoking OR have quit w/in 15years.) does not qualify.   Lung Cancer Screening Referral: no  Additional Screening:  Hepatitis C Screening: does qualify; Completed 06/13/2019  Vision Screening: Recommended annual ophthalmology exams for early detection of glaucoma and other disorders of the eye. Is the patient up to date with their annual eye exam?  Yes  Who is the provider or what is the name of the office in which the patient attends annual eye exams? [facility] is not established with a provider, would they like to be referred to a provider to establish care? No .   Dental Screening: Recommended annual dental exams for proper oral hygiene  Community Resource Referral / Chronic Care Management: CRR required this visit?  No   CCM required this visit?  No      Plan:     I have personally reviewed and noted the following in the patient's chart:   Medical and social history Use of alcohol, tobacco or illicit drugs  Current medications and supplements including opioid prescriptions. Patient is not currently taking opioid prescriptions. Functional ability and status  Nutritional status Physical activity Advanced directives List of other physicians Hospitalizations, surgeries, and ER visits in previous 12 months Vitals Screenings to include cognitive, depression, and falls Referrals and appointments  In addition, I have reviewed and discussed with patient certain preventive protocols, quality metrics, and best practice recommendations. A written personalized care plan for preventive services as well as general preventive health recommendations were provided to patient.     Kellie Simmering, LPN   [id number]   Nurse Notes: none

## 2021-11-10 NOTE — Patient Instructions (Signed)
[name] , Thank you for taking time to come for your Medicare Wellness Visit. I appreciate your ongoing commitment to your health goals. Please review the following plan we discussed and let me know if I can assist you in the future.   Screening recommendations/referrals: Colonoscopy: completed 12/27/2019, due 12/26/2024 Mammogram: completed scheduled for 11/15/2021 Bone Density: scheduled 11/15/2021 Recommended yearly ophthalmology/optometry visit for glaucoma screening and checkup Recommended yearly dental visit for hygiene and checkup  Vaccinations: Influenza vaccine: due Pneumococcal vaccine: due Tdap vaccine: completed 06/12/2021, due 06/13/2031 Shingles vaccine: discussed   Covid-19: 10/20/2021, 10/11/2019, 04/03/2019, 03/06/2019  Advanced directives: copy in the chart  Conditions/risks identified: none  Next appointment: Follow up in one year for your annual wellness visit    Preventive Care 74 Years and Older, Female Preventive care refers to lifestyle choices and visits with your health care provider that can promote health and wellness. What does preventive care include? A yearly physical exam. This is also called an annual well check. Dental exams once or twice a year. Routine eye exams. Ask your health care provider how often you should have your eyes checked. Personal lifestyle choices, including: Daily care of your teeth and gums. Regular physical activity. Eating a healthy diet. Avoiding tobacco and drug use. Limiting alcohol use. Practicing safe sex. Taking low-dose aspirin every day. Taking vitamin and mineral supplements as recommended by your health care provider. What happens during an annual well check? The services and screenings done by your health care provider during your annual well check will depend on your age, overall health, lifestyle risk factors, and family history of disease. Counseling  Your health care provider may ask you questions about  your: Alcohol use. Tobacco use. Drug use. Emotional well-being. Home and relationship well-being. Sexual activity. Eating habits. History of falls. Memory and ability to understand (cognition). Work and work Statistician. Reproductive health. Screening  You may have the following tests or measurements: Height, weight, and BMI. Blood pressure. Lipid and cholesterol levels. These may be checked every 5 years, or more frequently if you are over 64 years old. Skin check. Lung cancer screening. You may have this screening every year starting at age 30 if you have a 30-pack-year history of smoking and currently smoke or have quit within the past 15 years. Fecal occult blood test (FOBT) of the stool. You may have this test every year starting at age 6. Flexible sigmoidoscopy or colonoscopy. You may have a sigmoidoscopy every 5 years or a colonoscopy every 10 years starting at age 76. Hepatitis C blood test. Hepatitis B blood test. Sexually transmitted disease (STD) testing. Diabetes screening. This is done by checking your blood sugar (glucose) after you have not eaten for a while (fasting). You may have this done every 1-3 years. Bone density scan. This is done to screen for osteoporosis. You may have this done starting at age 52. Mammogram. This may be done every 1-2 years. Talk to your health care provider about how often you should have regular mammograms. Talk with your health care provider about your test results, treatment options, and if necessary, the need for more tests. Vaccines  Your health care provider may recommend certain vaccines, such as: Influenza vaccine. This is recommended every year. Tetanus, diphtheria, and acellular pertussis (Tdap, Td) vaccine. You may need a Td booster every 10 years. Zoster vaccine. You may need this after age 90. Pneumococcal 13-valent conjugate (PCV13) vaccine. One dose is recommended after age 22. Pneumococcal polysaccharide (PPSV23) vaccine.  One  dose is recommended after age 67. Talk to your health care provider about which screenings and vaccines you need and how often you need them. This information is not intended to replace advice given to you by your health care provider. Make sure you discuss any questions you have with your health care provider. Document Released: 01/23/2015 Document Revised: 09/16/2015 Document Reviewed: 10/28/2014 Elsevier Interactive Patient Education  2017 [place] Prevention in the Home Falls can cause injuries. They can happen to people of all ages. There are many things you can do to make your home safe and to help prevent falls. What can I do on the outside of my home? Regularly fix the edges of walkways and driveways and fix any cracks. Remove anything that might make you trip as you walk through a door, such as a raised step or threshold. Trim any bushes or trees on the path to your home. Use bright outdoor lighting. Clear any walking paths of anything that might make someone trip, such as rocks or tools. Regularly check to see if handrails are loose or broken. Make sure that both sides of any steps have handrails. Any raised decks and porches should have guardrails on the edges. Have any leaves, snow, or ice cleared regularly. Use sand or salt on walking paths during winter. Clean up any spills in your garage right away. This includes oil or grease spills. What can I do in the bathroom? Use night lights. Install grab bars by the toilet and in the tub and shower. Do not use towel bars as grab bars. Use non-skid mats or decals in the tub or shower. If you need to sit down in the shower, use a plastic, non-slip stool. Keep the floor dry. Clean up any water that spills on the floor as soon as it happens. Remove soap buildup in the tub or shower regularly. Attach bath mats securely with double-sided non-slip rug tape. Do not have throw rugs and other things on the floor that can make  you trip. What can I do in the bedroom? Use night lights. Make sure that you have a light by your bed that is easy to reach. Do not use any sheets or blankets that are too big for your bed. They should not hang down onto the floor. Have a firm chair that has side arms. You can use this for support while you get dressed. Do not have throw rugs and other things on the floor that can make you trip. What can I do in the kitchen? Clean up any spills right away. Avoid walking on wet floors. Keep items that you use a lot in easy-to-reach places. If you need to reach something above you, use a strong step stool that has a grab bar. Keep electrical cords out of the way. Do not use floor polish or wax that makes floors slippery. If you must use wax, use non-skid floor wax. Do not have throw rugs and other things on the floor that can make you trip. What can I do with my stairs? Do not leave any items on the stairs. Make sure that there are handrails on both sides of the stairs and use them. Fix handrails that are broken or loose. Make sure that handrails are as long as the stairways. Check any carpeting to make sure that it is firmly attached to the stairs. Fix any carpet that is loose or worn. Avoid having throw rugs at the top or bottom of the stairs.  If you do have throw rugs, attach them to the floor with carpet tape. Make sure that you have a light switch at the top of the stairs and the bottom of the stairs. If you do not have them, ask someone to add them for you. What else can I do to help prevent falls? Wear shoes that: Do not have high heels. Have rubber bottoms. Are comfortable and fit you well. Are closed at the toe. Do not wear sandals. If you use a stepladder: Make sure that it is fully opened. Do not climb a closed stepladder. Make sure that both sides of the stepladder are locked into place. Ask someone to hold it for you, if possible. Clearly mark and make sure that you can  see: Any grab bars or handrails. First and last steps. Where the edge of each step is. Use tools that help you move around (mobility aids) if they are needed. These include: Canes. Walkers. Scooters. Crutches. Turn on the lights when you go into a dark area. Replace any light bulbs as soon as they burn out. Set up your furniture so you have a clear path. Avoid moving your furniture around. If any of your floors are uneven, fix them. If there are any pets around you, be aware of where they are. Review your medicines with your doctor. Some medicines can make you feel dizzy. This can increase your chance of falling. Ask your doctor what other things that you can do to help prevent falls. This information is not intended to replace advice given to you by your health care provider. Make sure you discuss any questions you have with your health care provider. Document Released: 10/23/2008 Document Revised: 06/04/2015 Document Reviewed: 01/31/2014 Elsevier Interactive Patient Education  2017 [employer].

## 2021-11-15 DIAGNOSIS — M85832 Other specified disorders of bone density and structure, left forearm: Secondary | ICD-10-CM | POA: Diagnosis not present

## 2021-11-15 DIAGNOSIS — Z1231 Encounter for screening mammogram for malignant neoplasm of breast: Secondary | ICD-10-CM | POA: Diagnosis not present

## 2021-11-15 LAB — HM DEXA SCAN

## 2021-11-15 LAB — HM MAMMOGRAPHY

## 2021-11-16 ENCOUNTER — Other Ambulatory Visit: Payer: Self-pay | Admitting: Nurse Practitioner

## 2021-11-16 DIAGNOSIS — I10 Essential (primary) hypertension: Secondary | ICD-10-CM

## 2021-11-23 NOTE — H&P (Signed)
Patient's anticipated LOS is less than 2 midnights, meeting these requirements: - Younger than 57 - Lives within 1 hour of care - Has a competent adult at home to recover with post-op recover - NO history of  - Chronic pain requiring opiods  - Diabetes  - Coronary Artery Disease  - Heart failure  - Heart attack  - Stroke  - DVT/VTE  - Cardiac arrhythmia  - Respiratory Failure/COPD  - Renal failure  - Anemia  - Advanced Liver disease     Linda Allen is an 76 y.o. female.    Chief Complaint: right shoulder pain  HPI: Pt is a 76 y.o. female complaining of right shoulder pain for multiple years. Pain had continually increased since the beginning. X-rays in the clinic show end-stage arthritic changes of the right shoulder. Pt has tried various conservative treatments which have failed to alleviate their symptoms, including injections and therapy. Various options are discussed with the patient. Risks, benefits and expectations were discussed with the patient. Patient understand the risks, benefits and expectations and wishes to proceed with surgery.   PCP:  Minette Brine, FNP  D/C Plans: Home  PMH: Past Medical History:  Diagnosis Date   Allergy    Angio-edema    Arthritis    L hip   Atypical mole 01/21/2014   SEBACECIOUS ATYPIA RIGHT SCALP = MOHS   Heart murmur    Hypertension    Scleroderma (HCC)    Urticaria     PSH: Past Surgical History:  Procedure Laterality Date   ADENOIDECTOMY     ENDOVENOUS ABLATION SAPHENOUS VEIN W/ LASER Left 09/26/2018   endovenous laser ablation left greater saphenous vein by Ruta Hinds MD    ENDOVENOUS ABLATION SAPHENOUS VEIN W/ LASER Right 10/17/2018   endovenous laser ablation right greater saphenous vein by Ruta Hinds MD    JOINT REPLACEMENT  2011, 2012   both knees   REVERSE SHOULDER ARTHROPLASTY Left 09/20/2019   Procedure: REVERSE SHOULDER ARTHROPLASTY;  Surgeon: Netta Cedars, MD;  Location: [facility];  Service:  Orthopedics;  Laterality: Left;   SHOULDER ACROMIOPLASTY Right 2012   TONSILLECTOMY     TOTAL HIP ARTHROPLASTY Left 07/09/2019   Procedure: TOTAL HIP ARTHROPLASTY ANTERIOR APPROACH;  Surgeon: Paralee Cancel, MD;  Location: [facility];  Service: Orthopedics;  Laterality: Left;  70 mins    Social History:  reports that she quit smoking about 40 years ago. Her smoking use included cigarettes. She has never used smokeless tobacco. She reports current alcohol use. She reports that she does not use drugs. BMI: Estimated body mass index is 42.67 kg/m as calculated from the following:   Height as of 11/10/21: 5' 6.2" (1.681 m).   Weight as of 11/10/21: 120.7 kg.  Lab Results  Component Value Date   ALBUMIN 4.6 10/06/2021   Diabetes: Patient does not have a diagnosis of diabetes. Lab Results  Component Value Date   HGBA1C 5.5 10/06/2021     Smoking Status:      Allergies:  Allergies  Allergen Reactions   Bee Venom Shortness Of Breath   Ivp Dye [Iodinated Contrast Media] Shortness Of Breath and Rash   Other Rash    Contrast dye   Penicillins Hives and Rash    Tolerated Cephalosporin Date: 07/09/19.      Medications: No current facility-administered medications for this encounter.   Current Outpatient Medications  Medication Sig Dispense Refill   amLODipine (NORVASC) 5 MG tablet TAKE 1 TABLET BY MOUTH ONCE DAILY  90 tablet 1   lisinopril (ZESTRIL) 20 MG tablet TAKE 1 TABLET(20 MG) BY MOUTH DAILY 90 tablet 1    No results found for this or any previous visit (from the past 48 hour(s)). No results found.  ROS: Pain with rom of the right upper extremity  Physical Exam: Alert and oriented 76 y.o. female in no acute distress Cranial nerves 2-12 intact Cervical spine: full rom with no tenderness, nv intact distally Chest: active breath sounds bilaterally, no wheeze rhonchi or rales Heart: regular rate and rhythm, no murmur Abd: non tender non distended with active bowel sounds Hip  is stable with rom  Right shoulder painful and weak rom Nv intact distally No rashes or edema distally  Assessment/Plan Assessment: right shoulder cuff arthropathy  Plan:  Patient will undergo a right reverse total shoulder by Dr. Veverly Fells at [facility] Risks benefits and expectations were discussed with the patient. Patient understand risks, benefits and expectations and wishes to proceed. Preoperative templating of the joint replacement has been completed, documented, and submitted to the Operating Room personnel in order to optimize intra-operative equipment management.   Merla Riches PA-C, MPAS [facility] is now [facility] [address] [place], [place], [place] [address] Phone: [phone] [email or web address] Facebook  [employer]

## 2021-12-01 NOTE — Patient Instructions (Addendum)
SURGICAL WAITING ROOM VISITATION Patients having surgery or a procedure may have no more than 2 support people in the waiting area - these visitors may rotate.   Children under the age of 30 must have an adult with them who is not the patient. If the patient needs to stay at the hospital during part of their recovery, the visitor guidelines for inpatient rooms apply. Pre-op nurse will coordinate an appropriate time for 1 support person to accompany patient in pre-op.  This support person may not rotate.    Please refer to the [facility] website for the visitor guidelines for Inpatients (after your surgery is over and you are in a regular room).     Your procedure is scheduled on: 12/15/21   Report to [facility] Main Entrance    Report to admitting at 10:00 AM   Call this number if you have problems the morning of surgery [phone]   Do not eat food :After Midnight.   After Midnight you may have the following liquids until 9:30 AM DAY OF SURGERY  Water Non-Citrus Juices (without pulp, NO RED) Carbonated Beverages Black Coffee (NO MILK/CREAM OR CREAMERS, sugar ok)  Clear Tea (NO MILK/CREAM OR CREAMERS, sugar ok) regular and decaf                             Plain Jell-O (NO RED)                                           Fruit ices (not with fruit pulp, NO RED)                                     Popsicles (NO RED)                                                               Sports drinks like Gatorade (NO RED)                 The day of surgery:  Drink ONE (1) Pre-Surgery Clear Ensure at 9:30 AM the morning of surgery. Drink in one sitting. Do not sip.  This drink was given to you during your hospital  pre-op appointment visit. Nothing else to drink after completing the  Pre-Surgery Clear Ensure.          If you have questions, please contact your surgeon's office.   FOLLOW BOWEL PREP AND ANY ADDITIONAL PRE OP INSTRUCTIONS YOU RECEIVED FROM YOUR SURGEON'S  OFFICE!!!     Oral Hygiene is also important to reduce your risk of infection.                                    Remember - BRUSH YOUR TEETH THE MORNING OF SURGERY WITH YOUR REGULAR TOOTHPASTE   Take these medicines the morning of surgery with A SIP OF WATER: Amlodipine  You may not have any metal on your body including hair pins, jewelry, and body piercing             Do not wear make-up, lotions, powders, perfumes, or deodorant  Do not wear nail polish including gel and S&S, artificial/acrylic nails, or any other type of covering on natural nails including finger and toenails. If you have artificial nails, gel coating, etc. that needs to be removed by a nail salon please have this removed prior to surgery or surgery may need to be canceled/ delayed if the surgeon/ anesthesia feels like they are unable to be safely monitored.   Do not shave  48 hours prior to surgery.    Do not bring valuables to the hospital. [place].   Bring small overnight bag day of surgery.   DO NOT [place]. PHARMACY WILL DISPENSE MEDICATIONS LISTED ON YOUR MEDICATION LIST TO YOU DURING YOUR ADMISSION [place]!               Please read over the following fact sheets you were given: IF [place] [phone] Schneiders    If you received a COVID test during your pre-op visit  it is requested that you wear a mask when out in public, stay away from anyone that may not be feeling well and notify your surgeon if you develop symptoms. If you test positive for Covid or have been in contact with anyone that has tested positive in the last 10 days please notify you surgeon.    [facility] - Preparing for Surgery Before surgery, you can play an important role.  Because skin is not sterile, your skin needs to be as free of germs as possible.  You can  reduce the number of germs on your skin by washing with CHG (chlorahexidine gluconate) soap before surgery.  CHG is an antiseptic cleaner which kills germs and bonds with the skin to continue killing germs even after washing. Please DO NOT use if you have an allergy to CHG or antibacterial soaps.  If your skin becomes reddened/irritated stop using the CHG and inform your nurse when you arrive at Short Stay. Do not shave (including legs and underarms) for at least 48 hours prior to the first CHG shower.  You may shave your face/neck.  Please follow these instructions carefully:  1.  Shower with CHG Soap the night before surgery and the  morning of surgery.  2.  If you choose to wash your hair, wash your hair first as usual with your normal  shampoo.  3.  After you shampoo, rinse your hair and body thoroughly to remove the shampoo.                             4.  Use CHG as you would any other liquid soap.  You can apply chg directly to the skin and wash.  Gently with a scrungie or clean washcloth.  5.  Apply the CHG Soap to your body ONLY FROM THE NECK DOWN.   Do   not use on face/ open                           Wound or open sores. Avoid  contact with eyes, ears mouth and   genitals (private parts).                       Wash face,  Genitals (private parts) with your normal soap.             6.  Wash thoroughly, paying special attention to the area where your    surgery  will be performed.  7.  Thoroughly rinse your body with warm water from the neck down.  8.  DO NOT shower/wash with your normal soap after using and rinsing off the CHG Soap.                9.  Pat yourself dry with a clean towel.            10.  Wear clean pajamas.            11.  Place clean sheets on your bed the night of your first shower and do not  sleep with pets. Day of Surgery : Do not apply any lotions/deodorants the morning of surgery.  Please wear clean clothes to the hospital/surgery center.  FAILURE TO FOLLOW THESE  INSTRUCTIONS MAY RESULT IN THE CANCELLATION OF YOUR SURGERY  PATIENT SIGNATURE_________________________________  NURSE SIGNATURE__________________________________  ________________________________________________________________________  Adam Phenix  An incentive spirometer is a tool that can help keep your lungs clear and active. This tool measures how well you are filling your lungs with each breath. Taking long deep breaths may help reverse or decrease the chance of developing breathing (pulmonary) problems (especially infection) following: A long period of time when you are unable to move or be active. BEFORE THE PROCEDURE  If the spirometer includes an indicator to show your best effort, your nurse or respiratory therapist will set it to a desired goal. If possible, sit up straight or lean slightly forward. Try not to slouch. Hold the incentive spirometer in an upright position. INSTRUCTIONS FOR USE  Sit on the edge of your bed if possible, or sit up as far as you can in bed or on a chair. Hold the incentive spirometer in an upright position. Breathe out normally. Place the mouthpiece in your mouth and seal your lips tightly around it. Breathe in slowly and as deeply as possible, raising the piston or the ball toward the top of the column. Hold your breath for 3-5 seconds or for as long as possible. Allow the piston or ball to fall to the bottom of the column. Remove the mouthpiece from your mouth and breathe out normally. Rest for a few seconds and repeat Steps 1 through 7 at least 10 times every 1-2 hours when you are awake. Take your time and take a few normal breaths between deep breaths. The spirometer may include an indicator to show your best effort. Use the indicator as a goal to work toward during each repetition. After each set of 10 deep breaths, practice coughing to be sure your lungs are clear. If you have an incision (the cut made at the time of surgery), support  your incision when coughing by placing a pillow or rolled up towels firmly against it. Once you are able to get out of bed, walk around indoors and cough well. You may stop using the incentive spirometer when instructed by your caregiver.  RISKS AND COMPLICATIONS Take your time so you do not get dizzy or light-headed. If you are in pain, you may need to take or ask  for pain medication before doing incentive spirometry. It is harder to take a deep breath if you are having pain. AFTER USE Rest and breathe slowly and easily. It can be helpful to keep track of a log of your progress. Your caregiver can provide you with a simple table to help with this. If you are using the spirometer at home, follow these instructions: [place] IF:  You are having difficultly using the spirometer. You have trouble using the spirometer as often as instructed. Your pain medication is not giving enough relief while using the spirometer. You develop fever of 100.5 F (38.1 C) or higher. SEEK IMMEDIATE MEDICAL CARE IF:  You cough up bloody sputum that had not been present before. You develop fever of 102 F (38.9 C) or greater. You develop worsening pain at or near the incision site. MAKE SURE YOU:  Understand these instructions. Will watch your condition. Will get help right away if you are not doing well or get worse. Document Released: 05/09/2006 Document Revised: 03/21/2011 Document Reviewed: 07/10/2006 ExitCare Patient Information 2014 [place].   ________________________________________________________________________ [facility]- Preparing for Total Shoulder Arthroplasty    Before surgery, you can play an important role. Because skin is not sterile, your skin needs to be as free of germs as possible. You can reduce the number of germs on your skin by using the following products. Benzoyl Peroxide Gel Reduces the number of germs present on the skin Applied twice a day to shoulder area  starting two days before surgery    ==================================================================  Please follow these instructions carefully:  BENZOYL PEROXIDE 5% GEL  Please do not use if you have an allergy to benzoyl peroxide.   If your skin becomes reddened/irritated stop using the benzoyl peroxide.  Starting two days before surgery, apply as follows: Apply benzoyl peroxide in the morning and at night. Apply after taking a shower. If you are not taking a shower clean entire shoulder front, back, and side along with the armpit with a clean wet washcloth.  Place a quarter-sized dollop on your shoulder and rub in thoroughly, making sure to cover the front, back, and side of your shoulder, along with the armpit.   2 days before ____ AM   ____ PM              1 day before ____ AM   ____ PM                         Do this twice a day for two days.  (Last application is the night before surgery, AFTER using the CHG soap as described below).  Do NOT apply benzoyl peroxide gel on the day of surgery.

## 2021-12-01 NOTE — Progress Notes (Addendum)
COVID Vaccine Completed: yes  Date of COVID positive in last 90 days: no  PCP - Minette Brine, FNP Cardiologist - Eleonore Chiquito, MD  Cardiac clearance by Diona Browner 10/19/21 in [place] clearance by Minette Brine 10/06/21 in [facility]  Chest x-ray - n/a EKG - 10/19/21 Epic Stress Test - n/a ECHO - 11/03/21 Epic Cardiac Cath - n/a Pacemaker/ICD device last checked: n/a Spinal Cord Stimulator: n/a  Bowel Prep - no  Sleep Study - n/a CPAP -   Fasting Blood Sugar - n/a Checks Blood Sugar _____ times a day  Last dose of GLP1 agonist-  N/A GLP1 instructions:  N/A   Last dose of SGLT-2 inhibitors-  N/A SGLT-2 instructions: N/A   Blood Thinner Instructions: n/a Aspirin Instructions: Last Dose:  Activity level: Can go up a flight of stairs and perform activities of daily living without stopping and without symptoms of chest pain or shortness of breath.   Anesthesia review:   Patient denies shortness of breath, fever, cough and chest pain at PAT appointment  Patient verbalized understanding of instructions that were given to them at the PAT appointment. Patient was also instructed that they will need to review over the PAT instructions again at home before surgery.

## 2021-12-06 ENCOUNTER — Encounter (HOSPITAL_COMMUNITY)
Admission: RE | Admit: 2021-12-06 | Discharge: 2021-12-06 | Disposition: A | Discharge status: Home / Self Care | Payer: Medicare PPO | Source: Ambulatory Visit | Attending: Orthopedic Surgery | Admitting: Orthopedic Surgery

## 2021-12-06 ENCOUNTER — Encounter (HOSPITAL_COMMUNITY): Payer: Self-pay

## 2021-12-06 VITALS — BP 178/89 | HR 87 | Temp 97.9°F | Resp 16 | Ht 67.0 in | Wt 235.9 lb

## 2021-12-06 DIAGNOSIS — I10 Essential (primary) hypertension: Secondary | ICD-10-CM | POA: Diagnosis not present

## 2021-12-06 DIAGNOSIS — Z01812 Encounter for preprocedural laboratory examination: Secondary | ICD-10-CM | POA: Diagnosis not present

## 2021-12-06 DIAGNOSIS — Z01818 Encounter for other preprocedural examination: Secondary | ICD-10-CM

## 2021-12-06 HISTORY — DX: Pneumonia, unspecified organism: J18.9

## 2021-12-06 LAB — SURGICAL PCR SCREEN
MRSA, PCR: NEGATIVE
Staphylococcus aureus: NEGATIVE

## 2021-12-06 LAB — BASIC METABOLIC PANEL
Anion gap: 7 (ref 5–15)
BUN: 24 mg/dL — ABNORMAL HIGH (ref 8–23)
CO2: 27 mmol/L (ref 22–32)
Calcium: 9.6 mg/dL (ref 8.9–10.3)
Chloride: 104 mmol/L (ref 98–111)
Creatinine, Ser: 1.21 mg/dL — ABNORMAL HIGH (ref 0.44–1.00)
GFR, Estimated: 47 mL/min — ABNORMAL LOW (ref >60)
Glucose, Bld: 92 mg/dL (ref 70–99)
Potassium: 4.7 mmol/L (ref 3.5–5.1)
Sodium: 138 mmol/L (ref 135–145)

## 2021-12-06 LAB — CBC
HCT: 39.8 % (ref 36.0–46.0)
Hemoglobin: 12.9 g/dL (ref 12.0–15.0)
MCH: 33.3 pg (ref 26.0–34.0)
MCHC: 32.4 g/dL (ref 30.0–36.0)
MCV: 102.8 fL — ABNORMAL HIGH (ref 80.0–100.0)
Platelets: 313 10*3/uL (ref 150–400)
RBC: 3.87 MIL/uL (ref 3.87–5.11)
RDW: 14.9 % (ref 11.5–15.5)
WBC: 5.7 10*3/uL (ref 4.0–10.5)
nRBC: 0 % (ref 0.0–0.2)

## 2021-12-09 ENCOUNTER — Encounter: Payer: Medicare PPO | Admitting: Nurse Practitioner

## 2021-12-15 ENCOUNTER — Other Ambulatory Visit: Payer: Self-pay

## 2021-12-15 ENCOUNTER — Encounter (HOSPITAL_COMMUNITY): Payer: Self-pay | Admitting: Orthopedic Surgery

## 2021-12-15 ENCOUNTER — Ambulatory Visit (HOSPITAL_BASED_OUTPATIENT_CLINIC_OR_DEPARTMENT_OTHER): Payer: Medicare PPO | Admitting: Certified Registered"

## 2021-12-15 ENCOUNTER — Encounter (HOSPITAL_COMMUNITY)
Admission: RE | Disposition: A | Discharge status: Home / Self Care | Payer: Self-pay | Source: Ambulatory Visit | Attending: Orthopedic Surgery

## 2021-12-15 ENCOUNTER — Observation Stay (HOSPITAL_COMMUNITY): Payer: Medicare PPO

## 2021-12-15 ENCOUNTER — Ambulatory Visit (HOSPITAL_COMMUNITY): Payer: Medicare PPO | Admitting: Certified Registered"

## 2021-12-15 ENCOUNTER — Observation Stay (HOSPITAL_COMMUNITY)
Admission: RE | Admit: 2021-12-15 | Discharge: 2021-12-16 | Disposition: A | Discharge status: Home / Self Care | Payer: Medicare PPO | Source: Ambulatory Visit | Attending: Orthopedic Surgery | Admitting: Orthopedic Surgery

## 2021-12-15 DIAGNOSIS — Z87891 Personal history of nicotine dependence: Secondary | ICD-10-CM | POA: Insufficient documentation

## 2021-12-15 DIAGNOSIS — M19011 Primary osteoarthritis, right shoulder: Secondary | ICD-10-CM

## 2021-12-15 DIAGNOSIS — Z79899 Other long term (current) drug therapy: Secondary | ICD-10-CM | POA: Insufficient documentation

## 2021-12-15 DIAGNOSIS — I10 Essential (primary) hypertension: Secondary | ICD-10-CM | POA: Insufficient documentation

## 2021-12-15 DIAGNOSIS — N289 Disorder of kidney and ureter, unspecified: Secondary | ICD-10-CM

## 2021-12-15 DIAGNOSIS — Z96642 Presence of left artificial hip joint: Secondary | ICD-10-CM | POA: Diagnosis not present

## 2021-12-15 DIAGNOSIS — Z96653 Presence of artificial knee joint, bilateral: Secondary | ICD-10-CM | POA: Insufficient documentation

## 2021-12-15 DIAGNOSIS — Z96611 Presence of right artificial shoulder joint: Secondary | ICD-10-CM

## 2021-12-15 DIAGNOSIS — G8918 Other acute postprocedural pain: Secondary | ICD-10-CM | POA: Diagnosis not present

## 2021-12-15 DIAGNOSIS — Z01818 Encounter for other preprocedural examination: Secondary | ICD-10-CM

## 2021-12-15 DIAGNOSIS — Z471 Aftercare following joint replacement surgery: Secondary | ICD-10-CM | POA: Diagnosis not present

## 2021-12-15 HISTORY — PX: REVERSE SHOULDER ARTHROPLASTY: SHX5054

## 2021-12-15 SURGERY — ARTHROPLASTY, SHOULDER, TOTAL, REVERSE
Anesthesia: General | Site: Shoulder | Laterality: Right

## 2021-12-15 MED ORDER — MORPHINE SULFATE (PF) 2 MG/ML IV SOLN: 0.5000 mg | INTRAVENOUS | Status: DC | PRN

## 2021-12-15 MED ORDER — FENTANYL CITRATE PF 50 MCG/ML IJ SOSY: 25.0000 ug | PREFILLED_SYRINGE | INTRAMUSCULAR | Status: DC | PRN

## 2021-12-15 MED ORDER — PROPOFOL 10 MG/ML IV BOLUS
INTRAVENOUS | Status: DC | PRN
  Administered 2021-12-15: 120 mg via INTRAVENOUS

## 2021-12-15 MED ORDER — MENTHOL 3 MG MT LOZG: 1.0000 | LOZENGE | OROMUCOSAL | Status: DC | PRN

## 2021-12-15 MED ORDER — MIDAZOLAM HCL 2 MG/2ML IJ SOLN
1.0000 mg | INTRAMUSCULAR | Status: DC
  Filled 2021-12-15: qty 2

## 2021-12-15 MED ORDER — HYDROCODONE-ACETAMINOPHEN 5-325 MG PO TABS: 1.0000 | ORAL_TABLET | ORAL | Status: DC | PRN

## 2021-12-15 MED ORDER — TRANEXAMIC ACID-NACL 1000-0.7 MG/100ML-% IV SOLN
INTRAVENOUS | Status: AC
  Filled 2021-12-15: qty 100

## 2021-12-15 MED ORDER — HYDROCODONE-ACETAMINOPHEN 5-325 MG PO TABS: 1.0000 | ORAL_TABLET | Freq: Four times a day (QID) | ORAL | 0 refills | Status: DC | PRN

## 2021-12-15 MED ORDER — METOCLOPRAMIDE HCL 5 MG PO TABS: 5.0000 mg | ORAL_TABLET | Freq: Three times a day (TID) | ORAL | Status: DC | PRN

## 2021-12-15 MED ORDER — ONDANSETRON HCL 4 MG/2ML IJ SOLN: 4.0000 mg | Freq: Four times a day (QID) | INTRAMUSCULAR | Status: DC | PRN

## 2021-12-15 MED ORDER — ONDANSETRON HCL 4 MG/2ML IJ SOLN
INTRAMUSCULAR | Status: DC | PRN
  Administered 2021-12-15: 4 mg via INTRAVENOUS

## 2021-12-15 MED ORDER — PHENOL 1.4 % MT LIQD: 1.0000 | OROMUCOSAL | Status: DC | PRN

## 2021-12-15 MED ORDER — DEXAMETHASONE SODIUM PHOSPHATE 10 MG/ML IJ SOLN
INTRAMUSCULAR | Status: DC | PRN
  Administered 2021-12-15: 10 mg via INTRAVENOUS

## 2021-12-15 MED ORDER — LIDOCAINE 2% (20 MG/ML) 5 ML SYRINGE
INTRAMUSCULAR | Status: DC | PRN
  Administered 2021-12-15: 60 mg via INTRAVENOUS

## 2021-12-15 MED ORDER — FENTANYL CITRATE (PF) 100 MCG/2ML IJ SOLN
INTRAMUSCULAR | Status: AC
  Filled 2021-12-15: qty 2

## 2021-12-15 MED ORDER — 0.9 % SODIUM CHLORIDE (POUR BTL) OPTIME
TOPICAL | Status: DC | PRN
  Administered 2021-12-15: 1000 mL

## 2021-12-15 MED ORDER — ACETAMINOPHEN 500 MG PO TABS
1000.0000 mg | ORAL_TABLET | Freq: Once | ORAL | Status: AC
  Administered 2021-12-15: 1000 mg via ORAL
  Filled 2021-12-15: qty 2

## 2021-12-15 MED ORDER — PHENYLEPHRINE HCL-NACL 20-0.9 MG/250ML-% IV SOLN
INTRAVENOUS | Status: DC | PRN
  Administered 2021-12-15: 25 ug/min via INTRAVENOUS

## 2021-12-15 MED ORDER — ONDANSETRON HCL 4 MG/2ML IJ SOLN
INTRAMUSCULAR | Status: AC
  Filled 2021-12-15: qty 2

## 2021-12-15 MED ORDER — EPHEDRINE 5 MG/ML INJ
INTRAVENOUS | Status: AC
  Filled 2021-12-15: qty 5

## 2021-12-15 MED ORDER — EPHEDRINE SULFATE-NACL 50-0.9 MG/10ML-% IV SOSY
PREFILLED_SYRINGE | INTRAVENOUS | Status: DC | PRN
  Administered 2021-12-15 (×2): 5 mg via INTRAVENOUS

## 2021-12-15 MED ORDER — LISINOPRIL 20 MG PO TABS
20.0000 mg | ORAL_TABLET | Freq: Every day | ORAL | Status: DC
  Administered 2021-12-15 – 2021-12-16 (×2): 20 mg via ORAL
  Filled 2021-12-15 (×2): qty 1

## 2021-12-15 MED ORDER — MOMETASONE FUROATE 0.1 % EX CREA: 1.0000 | TOPICAL_CREAM | Freq: Every day | CUTANEOUS | Status: DC | PRN

## 2021-12-15 MED ORDER — ONDANSETRON HCL 4 MG PO TABS: 4.0000 mg | ORAL_TABLET | Freq: Four times a day (QID) | ORAL | Status: DC | PRN

## 2021-12-15 MED ORDER — PROPOFOL 10 MG/ML IV BOLUS
INTRAVENOUS | Status: AC
  Filled 2021-12-15: qty 20

## 2021-12-15 MED ORDER — METOCLOPRAMIDE HCL 5 MG/ML IJ SOLN: 5.0000 mg | Freq: Three times a day (TID) | INTRAMUSCULAR | Status: DC | PRN

## 2021-12-15 MED ORDER — TRANEXAMIC ACID-NACL 1000-0.7 MG/100ML-% IV SOLN
1000.0000 mg | Freq: Once | INTRAVENOUS | Status: AC
  Administered 2021-12-15: 1000 mg via INTRAVENOUS
  Filled 2021-12-15: qty 100

## 2021-12-15 MED ORDER — DEXAMETHASONE SODIUM PHOSPHATE 10 MG/ML IJ SOLN
INTRAMUSCULAR | Status: AC
  Filled 2021-12-15: qty 1

## 2021-12-15 MED ORDER — CHLORHEXIDINE GLUCONATE 0.12 % MT SOLN
15.0000 mL | Freq: Once | OROMUCOSAL | Status: AC
  Administered 2021-12-15: 15 mL via OROMUCOSAL

## 2021-12-15 MED ORDER — BUPIVACAINE LIPOSOME 1.3 % IJ SUSP
INTRAMUSCULAR | Status: DC | PRN
  Administered 2021-12-15: 10 mL via PERINEURAL

## 2021-12-15 MED ORDER — LACTATED RINGERS IV SOLN: INTRAVENOUS | Status: DC

## 2021-12-15 MED ORDER — METHOCARBAMOL 500 MG PO TABS: 500.0000 mg | ORAL_TABLET | Freq: Three times a day (TID) | ORAL | 1 refills | Status: DC | PRN

## 2021-12-15 MED ORDER — METHOCARBAMOL 1000 MG/10ML IJ SOLN: 500.0000 mg | Freq: Four times a day (QID) | INTRAVENOUS | Status: DC | PRN

## 2021-12-15 MED ORDER — LIDOCAINE HCL (PF) 2 % IJ SOLN
INTRAMUSCULAR | Status: AC
  Filled 2021-12-15: qty 5

## 2021-12-15 MED ORDER — SODIUM CHLORIDE 0.9 % IV SOLN: INTRAVENOUS | Status: DC

## 2021-12-15 MED ORDER — ROCURONIUM BROMIDE 10 MG/ML (PF) SYRINGE
PREFILLED_SYRINGE | INTRAVENOUS | Status: AC
  Filled 2021-12-15: qty 10

## 2021-12-15 MED ORDER — BUPIVACAINE HCL (PF) 0.5 % IJ SOLN
INTRAMUSCULAR | Status: AC
  Filled 2021-12-15: qty 30

## 2021-12-15 MED ORDER — BUPIVACAINE HCL (PF) 0.5 % IJ SOLN
INTRAMUSCULAR | Status: DC | PRN
  Administered 2021-12-15: 15 mL via PERINEURAL

## 2021-12-15 MED ORDER — PHENYLEPHRINE 80 MCG/ML (10ML) SYRINGE FOR IV PUSH (FOR BLOOD PRESSURE SUPPORT)
PREFILLED_SYRINGE | INTRAVENOUS | Status: DC | PRN
  Administered 2021-12-15 (×2): 80 ug via INTRAVENOUS

## 2021-12-15 MED ORDER — AMLODIPINE BESYLATE 5 MG PO TABS
5.0000 mg | ORAL_TABLET | Freq: Every day | ORAL | Status: DC
  Administered 2021-12-16: 5 mg via ORAL
  Filled 2021-12-15: qty 1

## 2021-12-15 MED ORDER — POLYETHYLENE GLYCOL 3350 17 G PO PACK: 17.0000 g | PACK | Freq: Every day | ORAL | Status: DC | PRN

## 2021-12-15 MED ORDER — ONDANSETRON HCL 4 MG/2ML IJ SOLN
4.0000 mg | Freq: Once | INTRAMUSCULAR | Status: AC | PRN
  Administered 2021-12-15: 4 mg via INTRAVENOUS

## 2021-12-15 MED ORDER — OXYCODONE HCL 5 MG/5ML PO SOLN: 5.0000 mg | Freq: Once | ORAL | Status: DC | PRN

## 2021-12-15 MED ORDER — CEFAZOLIN SODIUM-DEXTROSE 2-4 GM/100ML-% IV SOLN
2.0000 g | Freq: Four times a day (QID) | INTRAVENOUS | Status: AC
  Administered 2021-12-15 – 2021-12-16 (×3): 2 g via INTRAVENOUS
  Filled 2021-12-15 (×3): qty 100

## 2021-12-15 MED ORDER — METHOCARBAMOL 500 MG PO TABS: 500.0000 mg | ORAL_TABLET | Freq: Four times a day (QID) | ORAL | Status: DC | PRN

## 2021-12-15 MED ORDER — FENTANYL CITRATE (PF) 100 MCG/2ML IJ SOLN
INTRAMUSCULAR | Status: DC | PRN
  Administered 2021-12-15 (×2): 50 ug via INTRAVENOUS

## 2021-12-15 MED ORDER — OXYCODONE HCL 5 MG PO TABS: 5.0000 mg | ORAL_TABLET | Freq: Once | ORAL | Status: DC | PRN

## 2021-12-15 MED ORDER — BUPIVACAINE HCL (PF) 0.5 % IJ SOLN
INTRAMUSCULAR | Status: DC | PRN
  Administered 2021-12-15: 30 mL

## 2021-12-15 MED ORDER — ORAL CARE MOUTH RINSE: 15.0000 mL | Freq: Once | OROMUCOSAL | Status: AC

## 2021-12-15 MED ORDER — ONDANSETRON HCL 4 MG PO TABS: 4.0000 mg | ORAL_TABLET | Freq: Three times a day (TID) | ORAL | 1 refills | Status: DC | PRN

## 2021-12-15 MED ORDER — DOCUSATE SODIUM 100 MG PO CAPS
100.0000 mg | ORAL_CAPSULE | Freq: Two times a day (BID) | ORAL | Status: DC
  Administered 2021-12-15 – 2021-12-16 (×2): 100 mg via ORAL
  Filled 2021-12-15 (×2): qty 1

## 2021-12-15 MED ORDER — ROCURONIUM BROMIDE 10 MG/ML (PF) SYRINGE
PREFILLED_SYRINGE | INTRAVENOUS | Status: DC | PRN
  Administered 2021-12-15: 60 mg via INTRAVENOUS

## 2021-12-15 MED ORDER — TRANEXAMIC ACID-NACL 1000-0.7 MG/100ML-% IV SOLN
INTRAVENOUS | Status: DC | PRN
  Administered 2021-12-15: 1000 mg via INTRAVENOUS

## 2021-12-15 MED ORDER — ACETAMINOPHEN 325 MG PO TABS: 325.0000 mg | ORAL_TABLET | Freq: Four times a day (QID) | ORAL | Status: DC | PRN

## 2021-12-15 MED ORDER — FENTANYL CITRATE PF 50 MCG/ML IJ SOSY
50.0000 ug | PREFILLED_SYRINGE | INTRAMUSCULAR | Status: DC
  Administered 2021-12-15: 50 ug via INTRAVENOUS
  Filled 2021-12-15: qty 2

## 2021-12-15 MED ORDER — SUGAMMADEX SODIUM 500 MG/5ML IV SOLN
INTRAVENOUS | Status: DC | PRN
  Administered 2021-12-15: 250 mg via INTRAVENOUS

## 2021-12-15 MED ORDER — CEFAZOLIN SODIUM-DEXTROSE 2-4 GM/100ML-% IV SOLN
2.0000 g | INTRAVENOUS | Status: AC
  Administered 2021-12-15: 2 g via INTRAVENOUS
  Filled 2021-12-15: qty 100

## 2021-12-15 SURGICAL SUPPLY — 72 items
BAG COUNTER SPONGE SURGICOUNT (BAG) IMPLANT
BAG ZIPLOCK 12X15 (MISCELLANEOUS) IMPLANT
BIT DRILL 1.6MX128 (BIT) IMPLANT
BIT DRILL 170X2.5X (BIT) IMPLANT
BIT DRL 170X2.5X (BIT) ×1
BLADE SAG 18X100X1.27 (BLADE) ×1 IMPLANT
COVER BACK TABLE 60X90IN (DRAPES) ×1 IMPLANT
COVER SURGICAL LIGHT HANDLE (MISCELLANEOUS) ×1 IMPLANT
CUP HUMERAL 42 PLUS 3 (Orthopedic Implant) IMPLANT
DRAPE INCISE IOBAN 66X45 STRL (DRAPES) ×1 IMPLANT
DRAPE ORTHO SPLIT 77X108 STRL (DRAPES) ×2
DRAPE SHEET LG 3/4 BI-LAMINATE (DRAPES) ×1 IMPLANT
DRAPE SURG ORHT 6 SPLT 77X108 (DRAPES) ×2 IMPLANT
DRAPE TOP 10253 STERILE (DRAPES) ×1 IMPLANT
DRAPE U-SHAPE 47X51 STRL (DRAPES) ×1 IMPLANT
DRILL 2.5 (BIT) ×1
DRSG ADAPTIC 3X8 NADH LF (GAUZE/BANDAGES/DRESSINGS) ×1 IMPLANT
DURAPREP 26ML APPLICATOR (WOUND CARE) ×1 IMPLANT
ELECT BLADE TIP CTD 4 INCH (ELECTRODE) ×1 IMPLANT
ELECT NDL TIP 2.8 STRL (NEEDLE) ×1 IMPLANT
ELECT NEEDLE TIP 2.8 STRL (NEEDLE) ×1 IMPLANT
ELECT REM PT RETURN 15FT ADLT (MISCELLANEOUS) ×1 IMPLANT
EPIPHYSI RIGHT SZ 2 (Shoulder) ×1 IMPLANT
EPIPHYSIS RIGHT SZ 2 (Shoulder) IMPLANT
FACESHIELD WRAPAROUND (MASK) ×1 IMPLANT
FACESHIELD WRAPAROUND OR TEAM (MASK) ×1 IMPLANT
GAUZE PAD ABD 8X10 STRL (GAUZE/BANDAGES/DRESSINGS) ×1 IMPLANT
GAUZE SPONGE 4X4 12PLY STRL (GAUZE/BANDAGES/DRESSINGS) ×1 IMPLANT
GLENOSPHERE XTEND LAT 42+0 STD (Miscellaneous) IMPLANT
GLOVE BIOGEL PI IND STRL 7.5 (GLOVE) ×1 IMPLANT
GLOVE BIOGEL PI IND STRL 8.5 (GLOVE) ×1 IMPLANT
GLOVE ORTHO TXT STRL SZ7.5 (GLOVE) ×1 IMPLANT
GLOVE SURG ORTHO 8.5 STRL (GLOVE) ×1 IMPLANT
GOWN STRL REUS W/ TWL XL LVL3 (GOWN DISPOSABLE) ×2 IMPLANT
GOWN STRL REUS W/TWL XL LVL3 (GOWN DISPOSABLE) ×2
KIT BASIN OR (CUSTOM PROCEDURE TRAY) ×1 IMPLANT
KIT TURNOVER KIT A (KITS) IMPLANT
MANIFOLD NEPTUNE II (INSTRUMENTS) ×1 IMPLANT
METAGLENE DELTA EXTEND (Trauma) IMPLANT
METAGLENE DXTEND (Trauma) ×1 IMPLANT
NDL MAYO CATGUT SZ4 TPR NDL (NEEDLE) ×1 IMPLANT
NEEDLE MAYO CATGUT SZ4 (NEEDLE) ×2 IMPLANT
NS IRRIG 1000ML POUR BTL (IV SOLUTION) ×1 IMPLANT
PACK SHOULDER (CUSTOM PROCEDURE TRAY) ×1 IMPLANT
PIN GUIDE 1.2 (PIN) IMPLANT
PIN GUIDE GLENOPHERE 1.5MX300M (PIN) IMPLANT
PIN METAGLENE 2.5 (PIN) IMPLANT
PROTECTOR NERVE ULNAR (MISCELLANEOUS) ×1 IMPLANT
RESTRAINT HEAD UNIVERSAL NS (MISCELLANEOUS) ×1 IMPLANT
SCREW 4.5X18MM (Screw) ×1 IMPLANT
SCREW 4.5X24MM (Screw) ×1 IMPLANT
SCREW 4.5X36MM (Screw) IMPLANT
SCREW 48L (Screw) IMPLANT
SCREW BN 18X4.5XSTRL SHLDR (Screw) IMPLANT
SCREW BN 24X4.5XLCK STRL (Screw) IMPLANT
SLING ARM FOAM STRAP LRG (SOFTGOODS) IMPLANT
SMARTMIX MINI TOWER (MISCELLANEOUS)
SPIKE FLUID TRANSFER (MISCELLANEOUS) ×1 IMPLANT
SPONGE T-LAP 4X18 ~~LOC~~+RFID (SPONGE) ×1 IMPLANT
STEM 12 HA (Stem) IMPLANT
STRIP CLOSURE SKIN 1/2X4 (GAUZE/BANDAGES/DRESSINGS) ×1 IMPLANT
SUCTION FRAZIER HANDLE 10FR (MISCELLANEOUS) ×1
SUCTION TUBE FRAZIER 10FR DISP (MISCELLANEOUS) ×1 IMPLANT
SUT FIBERWIRE #2 38 T-5 BLUE (SUTURE) ×2
SUT MNCRL AB 4-0 PS2 18 (SUTURE) ×1 IMPLANT
SUT VIC AB 0 CT1 36 (SUTURE) ×2 IMPLANT
SUT VIC AB 0 CT2 27 (SUTURE) ×1 IMPLANT
SUT VIC AB 2-0 CT1 27 (SUTURE) ×1
SUT VIC AB 2-0 CT1 TAPERPNT 27 (SUTURE) ×1 IMPLANT
SUTURE FIBERWR #2 38 T-5 BLUE (SUTURE) ×2 IMPLANT
TOWEL OR 17X26 10 PK STRL BLUE (TOWEL DISPOSABLE) ×1 IMPLANT
TOWER SMARTMIX MINI (MISCELLANEOUS) IMPLANT

## 2021-12-15 NOTE — Anesthesia Preprocedure Evaluation (Addendum)
Anesthesia Evaluation  Patient identified by MRN, date of birth, ID band Patient awake    Reviewed: Allergy & Precautions, NPO status , Patient's Chart, lab work & pertinent test results  History of Anesthesia Complications Negative for: history of anesthetic complications  Airway Mallampati: II  TM Distance: >3 FB Neck ROM: Full    Dental  (+) Dental Advisory Given, Teeth Intact   Pulmonary former smoker   Pulmonary exam normal        Cardiovascular METS: 7 - 9 Mets hypertension, Pt. on medications Normal cardiovascular exam+ Valvular Problems/Murmurs AS    '23 TTE - EF 70 to 75%. There is mild concentric left ventricular hypertrophy. Grade I diastolic dysfunction (impaired relaxation). Right ventricular systolic function is hyperdynamic. Aortic valve regurgitation is mild. Moderate to severe aortic valve stenosis. Aortic valve area, by VTI measures 0.98 cm. Aortic valve mean gradient measures 31.0 mmHg. DVI 0.31.      Neuro/Psych negative neurological ROS  negative psych ROS   GI/Hepatic negative GI ROS, Neg liver ROS,,,  Endo/Other    Morbid obesity  Renal/GU Renal InsufficiencyRenal disease     Musculoskeletal  (+) Arthritis ,    Abdominal   Peds  Hematology negative hematology ROS (+)   Anesthesia Other Findings Scleroderma   Reproductive/Obstetrics                             Anesthesia Physical Anesthesia Plan  ASA: 3  Anesthesia Plan: General   Post-op Pain Management: Regional block* and Tylenol PO (pre-op)*   Induction: Intravenous  PONV Risk Score and Plan: 3 and Treatment may vary due to age or medical condition, Ondansetron, Dexamethasone and Midazolam  Airway Management Planned: Oral ETT  Additional Equipment: ClearSight  Intra-op Plan:   Post-operative Plan: Extubation in OR  Informed Consent: I have reviewed the patients History and Physical, chart, labs  and discussed the procedure including the risks, benefits and alternatives for the proposed anesthesia with the patient or authorized representative who has indicated his/her understanding and acceptance.     Dental advisory given  Plan Discussed with: CRNA and Anesthesiologist  Anesthesia Plan Comments:         Anesthesia Quick Evaluation

## 2021-12-15 NOTE — Anesthesia Postprocedure Evaluation (Signed)
Anesthesia Post Note  Patient: Linda Allen  Procedure(s) Performed: Right REVERSE SHOULDER ARTHROPLASTY (Right: Shoulder)     Patient location during evaluation: PACU Anesthesia Type: General Level of consciousness: awake and alert Pain management: pain level controlled Vital Signs Assessment: post-procedure vital signs reviewed and stable Respiratory status: spontaneous breathing, nonlabored ventilation and respiratory function stable Cardiovascular status: stable and blood pressure returned to baseline Anesthetic complications: no   No notable events documented.  Last Vitals:  Vitals:   12/15/21 1500 12/15/21 1515  BP: 122/74 (!) 118/52  Pulse: 93 82  Resp: 18 18  Temp:    SpO2: 98% 93%    Last Pain:  Vitals:   12/15/21 1515  TempSrc:   PainSc: 0-No pain                 Audry Pili

## 2021-12-15 NOTE — Interval H&P Note (Signed)
History and Physical Interval Note:  12/15/2021 12:10 PM  Linda Allen  has presented today for surgery, with the diagnosis of right shoulder end stage osteoarthritis.  The various methods of treatment have been discussed with the patient and family. After consideration of risks, benefits and other options for treatment, the patient has consented to  Procedure(s) with comments: Right REVERSE SHOULDER ARTHROPLASTY (Right) - 120 min choice and interscalene block as a surgical intervention.  The patient's history has been reviewed, patient examined, no change in status, stable for surgery.  I have reviewed the patient's chart and labs.  Questions were answered to the patient's satisfaction.     [name]

## 2021-12-15 NOTE — Discharge Instructions (Signed)
Ice to the shoulder constantly.  Keep the incision covered and clean and dry for one week, then ok to get it wet in the shower.  Do exercise as instructed several times per day.  DO NOT reach behind your back or push up out of a chair with the operative arm.  Use a sling while you are up and around for comfort, may remove while seated.  Keep pillow propped behind the operative elbow.  Follow up with Dr Veverly Fells in two weeks in the office, call [phone] for appt

## 2021-12-15 NOTE — Anesthesia Procedure Notes (Signed)
Procedure Name: Intubation Date/Time: 12/15/2021 12:33 PM  Performed by: Champagne Paletta D, CRNAPre-anesthesia Checklist: Patient identified, Emergency Drugs available, Suction available and Patient being monitored Patient Re-evaluated:Patient Re-evaluated prior to induction Oxygen Delivery Method: Circle system utilized Preoxygenation: Pre-oxygenation with 100% oxygen Induction Type: IV induction Ventilation: Mask ventilation without difficulty Laryngoscope Size: Mac and 4 Grade View: Grade II Tube type: Oral Tube size: 7.0 mm Number of attempts: 1 Airway Equipment and Method: Stylet and Oral airway Placement Confirmation: ETT inserted through vocal cords under direct vision, positive ETCO2 and breath sounds checked- equal and bilateral Secured at: 22 cm Tube secured with: Tape Dental Injury: Teeth and Oropharynx as per pre-operative assessment

## 2021-12-15 NOTE — Brief Op Note (Signed)
+  12/15/2021  2:30 PM  PATIENT:  Linda Allen  76 y.o. female  PRE-OPERATIVE DIAGNOSIS:  right shoulder end stage osteoarthritis  POST-OPERATIVE DIAGNOSIS:  right shoulder end stage osteoarthritis  PROCEDURE:  Procedure(s) with comments: Right REVERSE SHOULDER ARTHROPLASTY (Right) - 120 min choice and interscalene block DePuy Delta Xtend with subscap repair  SURGEON:  Surgeon(s) and Role:    Netta Cedars, MD - Primary  PHYSICIAN ASSISTANT:   ASSISTANTS: Ventura Bruns, PA-C   ANESTHESIA:   regional and general  EBL:  400 mL   BLOOD ADMINISTERED:none  DRAINS: none   LOCAL MEDICATIONS USED:  MARCAINE     SPECIMEN:  No Specimen  DISPOSITION OF SPECIMEN:  N/A  COUNTS:  YES  TOURNIQUET:  * No tourniquets in log *  DICTATION: .Other Dictation: Dictation Number [id number]  PLAN OF CARE: Admit for overnight observation  PATIENT DISPOSITION:  PACU - hemodynamically stable.   Delay start of Pharmacological VTE agent (>24hrs) due to surgical blood loss or risk of bleeding: not applicable

## 2021-12-15 NOTE — Op Note (Unsigned)
[name], [name]. MEDICAL RECORD NO: [id number] ACCOUNT NO: [id number] DATE OF BIRTH: 04/14/1945 FACILITY: [facility] LOC[facility]: [id number] PHYSICIAN: Doran Heater. Veverly Fells, MD  Operative Report   DATE OF PROCEDURE: 12/15/2021  PREOPERATIVE DIAGNOSIS:  Right shoulder end-stage arthritis.  POSTOPERATIVE DIAGNOSIS:  Right shoulder end-stage arthritis.  PROCEDURE PERFORMED:  Right reverse shoulder replacement using DePuy Delta Xtend prosthesis with subscap repair.  ATTENDING SURGEON:  Doran Heater. Veverly Fells, MD  ASSISTANT:  Charletta Cousin [place], [place], who was scrubbed during the entire procedure, and necessary for satisfactory completion of surgery.  ANESTHESIA:  General anesthesia plus interscalene block anesthesia was used.  ESTIMATED BLOOD LOSS:  400 mL  FLUID REPLACEMENT:  1500 mL crystalloid.  COUNTS:  Instrument counts correct.  COMPLICATIONS:  No complications.  ANTIBIOTICS:  Perioperative antibiotics were given.  INDICATIONS:  The patient is a 76 year old female who presents with a history of worsening right shoulder pain and dysfunction secondary to a combination of arthritis and rotator cuff dysfunction.  The patient has failed an extended period of  conservative management, desires operative treatment to eliminate pain and restore function.  Informed consent obtained.  DESCRIPTION OF PROCEDURE:  After an adequate level of general anesthesia was achieved and interscalene block, the patient positioned in modified beach chair position.  Right shoulder correctly identified and sterilely prepped and draped in the usual  manner.  Timeout called, verifying correct patient, correct side. We entered the patient's shoulder using standard deltopectoral approach, starting at the coracoid process extending down the anterior humerus.  Dissection down through subcutaneous tissues  using Bovie.  We identified the cephalic vein and took that laterally with the deltoid pectoralis taken medially.  Conjoined  tendon identified and retracted medially.  We tenodesed the biceps in situ with 0 Vicryl figure-of-eight suture.  We then went  ahead and released the subscapularis subperiosteally off the lesser tuberosity and tagged for repair at the end.  Once we had the subscap released, then we went ahead and released the inferior capsule progressively externally rotating and delivering the  humeral head out of the wound.  There was extensive arthritis with bone-on-bone and no cartilage remaining on the humeral head.  There were large osteophytes noted.  We entered the proximal humerus with a 6 mm reamer, reaming up to a size 12.  We then  used the 12 mm T-handle guide to resect the head at 20 degrees of retroversion with the oscillating saw.  We then removed excess osteophytes with a rongeur.  Next, we subluxed the humerus posteriorly getting good exposure of the glenoid face, which was  also devoid of cartilage.  We placed our deep retractors.  We did a 360 degree capsular and labral removal.  We also removed some of the damaged rotator cuff to decompress that area for the replacement.  Once we had our labrum debrided and our capsule  released, we placed our deep retractors.  We were able to identify the glenoid.  We centered the guide pin low on the glenoid and then reamed for the metaglene baseplate.  We then impacted the metaglene into position after using the T-handled reamer to  clear out peripheral bone for the placement of the glenosphere.  Once we had our baseplate in place, we used a 48 screw inferiorly, a 36 screw superiorly, an 18 nonlocked posteriorly and a 24 locked anteriorly.  We had 3 locking screws.  Excellent  baseplate support and good screw purchase.  Once the screws were locked in place, we used a  42 standard +0 glenosphere and placed that on the metaglene and then screwed that to affix it.  Once we had that in place, I did a finger sweep to make sure we  had no soft tissue caught up underneath  the glenosphere.  Next, we went back to the humeral side, we reamed for the 2 right metaphysis.  We then trialled with a 12 stem and the 2 right metaphysis set on the 0 setting and impacted in 20 degrees of  retroversion.  We used a 42+3 poly trial placed on the humeral tray, reduced the shoulder.  We had excellent soft tissue balancing and stability.  We removed the trial components.  We irrigated thoroughly. I drilled holes in lesser tuberosity and placed  #2 FiberWire suture for repair of the subscap.  Once we had the sutures placed, we went ahead and irrigated thoroughly and then used the HA-coated press-fit 12 stem and the 2 right metaphysis set on the 0 setting and we used available bone graft from the  humeral head with impaction grafting technique and impacted the stem into place.  We had excellent stem stability.  We then used the real 42+3 poly, impacted on the humeral tray, reduced the shoulder again.  Very happy with our tensioning, no gapping  with inferior pole or external rotation and appropriate conjoined tensioning, we irrigated thoroughly again and then repaired the subscapularis anatomically back to bone.  This did not restrict the range of motion.  I think will enhance the stability and  function.  We irrigated again and then closed deltopectoral interval with 0 Vicryl suture followed by 2-0 Vicryl for subcutaneous closure and 4-0 Monocryl for skin.  Steri-Strips applied followed by sterile dressing.  The patient tolerated surgery well.      PAA D: 12/15/2021 2:38:27 pm T: 12/15/2021 10:05:00 pm  JOB: [id number]/ [id number]

## 2021-12-15 NOTE — Transfer of Care (Signed)
Immediate Anesthesia Transfer of Care Note  Patient: Linda Allen  Procedure(s) Performed: Right REVERSE SHOULDER ARTHROPLASTY (Right: Shoulder)  Patient Location: PACU  Anesthesia Type:General  Level of Consciousness: awake, alert , and oriented  Airway & Oxygen Therapy: Patient Spontanous Breathing and Patient connected to face mask oxygen  Post-op Assessment: Report given to RN and Post -op Vital signs reviewed and stable  Post vital signs: Reviewed and stable  Last Vitals:  Vitals Value Taken Time  BP 149/71 12/15/21 1450  Temp    Pulse 86 12/15/21 1452  Resp 16 12/15/21 1452  SpO2 100 % 12/15/21 1452  Vitals shown include unvalidated device data.  Last Pain:  Vitals:   12/15/21 1145  TempSrc:   PainSc: 0-No pain         Complications: No notable events documented.

## 2021-12-15 NOTE — Care Plan (Signed)
Ortho Bundle Case Management Note  Patient Details  Name: Linda Allen MRN: [id number] Date of Birth: [birth year]                  R Rev TSA on 12-15-21  DCP: Home with husband  DME; Sling and CTU provided by the hospital  PT: HEP   DME Arranged:  N/A DME Agency:      Additional Comments: Please contact me with any questions of if this plan should need to change.  Marianne Sofia, RN,CCM [facility]  [phone] 12/15/2021, 4:15 PM

## 2021-12-15 NOTE — Anesthesia Procedure Notes (Addendum)
Anesthesia Regional Block: Interscalene brachial plexus block   Pre-Anesthetic Checklist: , timeout performed,  Correct Patient, Correct Site, Correct Laterality,  Correct Procedure, Correct Position, site marked,  Risks and benefits discussed,  Surgical consent,  Pre-op evaluation,  At surgeon's request and post-op pain management  Laterality: Right  Prep: chloraprep       Needles:  Injection technique: Single-shot  Needle Type: Echogenic Needle     Needle Length: 5cm  Needle Gauge: 21     Additional Needles:   Narrative:  Start time: 12/15/2021 11:13 AM End time: 12/15/2021 11:17 AM Injection made incrementally with aspirations every 5 mL.  Performed by: Personally  Anesthesiologist: Audry Pili, MD  Additional Notes: No pain on injection. No increased resistance to injection. Injection made in 5cc increments. Good needle visualization. Patient tolerated the procedure well.

## 2021-12-16 ENCOUNTER — Encounter (HOSPITAL_COMMUNITY): Payer: Self-pay | Admitting: Orthopedic Surgery

## 2021-12-16 DIAGNOSIS — M19011 Primary osteoarthritis, right shoulder: Secondary | ICD-10-CM | POA: Diagnosis not present

## 2021-12-16 DIAGNOSIS — I10 Essential (primary) hypertension: Secondary | ICD-10-CM | POA: Diagnosis not present

## 2021-12-16 DIAGNOSIS — Z87891 Personal history of nicotine dependence: Secondary | ICD-10-CM | POA: Diagnosis not present

## 2021-12-16 DIAGNOSIS — Z79899 Other long term (current) drug therapy: Secondary | ICD-10-CM | POA: Diagnosis not present

## 2021-12-16 DIAGNOSIS — Z96653 Presence of artificial knee joint, bilateral: Secondary | ICD-10-CM | POA: Diagnosis not present

## 2021-12-16 DIAGNOSIS — Z96642 Presence of left artificial hip joint: Secondary | ICD-10-CM | POA: Diagnosis not present

## 2021-12-16 LAB — BASIC METABOLIC PANEL
Anion gap: 7 (ref 5–15)
BUN: 24 mg/dL — ABNORMAL HIGH (ref 8–23)
CO2: 25 mmol/L (ref 22–32)
Calcium: 9.1 mg/dL (ref 8.9–10.3)
Chloride: 106 mmol/L (ref 98–111)
Creatinine, Ser: 1.17 mg/dL — ABNORMAL HIGH (ref 0.44–1.00)
GFR, Estimated: 49 mL/min — ABNORMAL LOW (ref >60)
Glucose, Bld: 159 mg/dL — ABNORMAL HIGH (ref 70–99)
Potassium: 5 mmol/L (ref 3.5–5.1)
Sodium: 138 mmol/L (ref 135–145)

## 2021-12-16 LAB — HEMOGLOBIN AND HEMATOCRIT, BLOOD
HCT: 34.2 % — ABNORMAL LOW (ref 36.0–46.0)
Hemoglobin: 10.9 g/dL — ABNORMAL LOW (ref 12.0–15.0)

## 2021-12-16 NOTE — Progress Notes (Signed)
Orthopedics Progress Note  Subjective: Patient with no pain this AM, block is still working  Objective:  Vitals:   12/15/21 2115 12/16/21 0410  BP: (!) 151/66 (!) 141/72  Pulse: 83 63  Resp: 17 18  Temp: (!) 97.5 F (36.4 C) 98 F (36.7 C)  SpO2: 98% 98%    General: Awake and alert  Musculoskeletal: Right shoulder dressing changed, wound CDI, some bruising, no drainage Neurovascularly intact  Lab Results  Component Value Date   WBC 5.7 12/06/2021   HGB 10.9 (L) 12/16/2021   HCT 34.2 (L) 12/16/2021   MCV 102.8 (H) 12/06/2021   PLT 313 12/06/2021       Component Value Date/Time   NA 138 12/16/2021 0351   NA 138 10/06/2021 1412   K 5.0 12/16/2021 0351   CL 106 12/16/2021 0351   CO2 25 12/16/2021 0351   GLUCOSE 159 (H) 12/16/2021 0351   BUN 24 (H) 12/16/2021 0351   BUN 22 10/06/2021 1412   CREATININE 1.17 (H) 12/16/2021 0351   CREATININE 1.11 (H) 11/14/2019 1045   CALCIUM 9.1 12/16/2021 0351   GFRNONAA 49 (L) 12/16/2021 0351   GFRNONAA 50 (L) 11/14/2019 1045   GFRAA 64 01/01/2020 0929   GFRAA 57 (L) 11/14/2019 1045    Lab Results  Component Value Date   INR 1.08 05/22/2009   INR 0.89 04/21/2009    Assessment/Plan: POD #1 s/p Procedure(s): Right REVERSE SHOULDER ARTHROPLASTY Stable this AM, home after therapy Protect subscap repair - no WB right arm, gentle ADLs hand to face are ok Follow up in two weeks in the office  Remo Lipps R. Veverly Fells, MD 12/16/2021 7:48 AM

## 2021-12-16 NOTE — Plan of Care (Signed)
Discharge instructions given to the pt. Including medications and follow up.

## 2021-12-16 NOTE — Progress Notes (Signed)
Transition of Care (TOC) Screening Note  Patient Details  Name: Linda Allen Date of Birth: 12/19/1945  Transition of [facility]) CM/SW Contact:    Sherie Don, LCSW Phone Number: 12/16/2021, 10:14 AM  Transition of Care Department [facility]) has reviewed patient and no TOC needs have been identified at this time. We will continue to monitor patient advancement through interdisciplinary progression rounds. If new patient transition needs arise, please place a TOC consult.

## 2021-12-16 NOTE — Evaluation (Signed)
Occupational Therapy Evaluation Patient Details Name: Linda Allen MRN: [id number] DOB: 09-07-1945 Today's Date: 12/16/2021   History of Present Illness Linda Allen is a 76 yr old female who is s/p a R reverse shoulder replacement on 12-15-2021, due to R shoulder end stage arthritis.   Clinical Impression   Pt is s/p shoulder replacement without functional use of right dominant upper extremity secondary to effects of surgery and interscalene block and shoulder precautions. Therapist provided education and instruction to patient with regards to ROM/exercises, post-surgical precautions, UE positioning, how to correctly donn/doff upper extremity clothing, recommendations for bathing while maintaining shoulder precautions, use of ice for pain and edema management and correctly donning/doffing sling. Patient verbalized understanding and demonstrated understanding as needed. Patient needed assistance to donn gown and underwear, with instruction provided on compensatory strategies to perform tasks. Patient to follow up with MD for further therapy needs.        Recommendations for follow up therapy are one component of a multi-disciplinary discharge planning process, led by the attending physician.  Recommendations may be updated based on patient status, additional functional criteria and insurance authorization.   Follow Up Recommendations  Follow physician's recommendations for discharge plan and follow up therapies     Assistance Recommended at Discharge Intermittent Supervision/Assistance  Patient can return home with the following Help with stairs or ramp for entrance;A little help with bathing/dressing/bathroom;Assistance with cooking/housework;Assist for transportation       Equipment Recommendations  None recommended by OT       Precautions / Restrictions Restrictions Weight Bearing Restrictions: Yes RUE Weight Bearing: Non weight bearing      Mobility Bed Mobility Overal  bed mobility: Modified Independent       Transfers Overall transfer level: Needs assistance   Transfers: Sit to/from Stand Sit to Stand: Supervision        Balance     Sitting balance-Leahy Scale: Good       Standing balance-Leahy Scale: Good             ADL either performed or assessed with clinical judgement   ADL Overall ADL's : Needs assistance/impaired Eating/Feeding: Independent Eating/Feeding Details (indicate cue type and reason): based on clinical judgement             Upper Body Dressing : Minimal assistance;Cueing for UE precautions;Sitting Upper Body Dressing Details (indicate cue type and reason): Pt instructed on proper post-surgical technique for UBB. She donned an overhead gown seated EOB Lower Body Dressing: Minimal assistance;Sit to/from stand Lower Body Dressing Details (indicate cue type and reason): Pt assisted with donning underwear over her hips once in standing                     Vision Baseline Vision/History: 1 Wears glasses Patient Visual Report: No change from baseline              Pertinent Vitals/Pain Pain Assessment Pain Assessment: No/denies pain     Hand Dominance Right      Communication Communication Communication: No difficulties   Cognition Arousal/Alertness: Awake/alert Behavior During Therapy: WFL for tasks assessed/performed Overall Cognitive Status: Within Functional Limits for tasks assessed          General Comments: Oriented x4, able to follow commands without difficulty           Shoulder Instructions Shoulder Instructions Donning/doffing shirt without moving shoulder: Minimal assistance Donning/doffing sling/immobilizer:  (Patient verbalized understanding) Correct positioning of sling/immobilizer:  (Patient verbalized understanding) ROM for elbow,  wrist and digits of operated UE:  (Patient verbalized understanding) Sling wearing schedule (on at all times/off for ADL's):  (Patient  verbalized understanding) Proper positioning of operated UE when showering:  (Patient verbalized understanding) Positioning of UE while sleeping:  (Patient verbalized understanding)    [place] expects to be discharged to:: Private residence Living Arrangements: Spouse/significant other Available Help at Discharge: Family Type of Home: House Home Access: Stairs to enter Technical brewer of Steps: 1   Home Layout: Multi-level/3 levels;Able to live on main level with bedroom/bathroom;Full bath on main level     Bathroom Shower/Tub: Tub/shower unit;Walk-in shower           Prior Functioning/Environment Prior Level of Function : Independent/Modified Independent             Mobility Comments: Independent ADLs Comments: Independent with ADLs, driving, cooking, and cleaning.        OT Problem List: Impaired UE functional use            OT Frequency:  N/A       AM-PAC OT "6 Clicks" Daily Activity     Outcome Measure Help from another person eating meals?: None Help from another person taking care of personal grooming?: None Help from another person toileting, which includes using toliet, bedpan, or urinal?: None Help from another person bathing (including washing, rinsing, drying)?: A Little Help from another person to put on and taking off regular upper body clothing?: A Little Help from another person to put on and taking off regular lower body clothing?: A Little 6 Click Score: 21   End of Session Nurse Communication: Other (comment) (nurse informed of shoulder instruction completed)  Activity Tolerance: Patient tolerated treatment well Patient left: in chair;with call bell/phone within reach  OT Visit Diagnosis: Muscle weakness (generalized) (M62.81)                Time: 1000-1027 OT Time Calculation (min): 27 min Charges:  OT General Charges $OT Visit: 1 Visit OT Evaluation $OT Eval Low Complexity: 1 Low OT Treatments $Self  Care/Home Management : 8-22 mins    Leota Sauers, OTR/L 12/16/2021, 11:05 AM

## 2021-12-16 NOTE — Discharge Summary (Signed)
In most cases prophylactic antibiotics for Dental procdeures after total joint surgery are not necessary.  Exceptions are as follows:  1. History of prior total joint infection  2. Severely immunocompromised (Organ Transplant, cancer chemotherapy, Rheumatoid biologic meds such as [place])  3. Poorly controlled diabetes (A1C &gt; 8.0, blood glucose over 200)  If you have one of these conditions, contact your surgeon for an antibiotic prescription, prior to your dental procedure. Orthopedic Discharge Summary        Physician Discharge Summary  Patient ID: Linda Allen MRN: [id number] DOB/AGE: Oct 31, 1945 76 y.o.  Admit date: 12/15/2021 Discharge date: 12/16/2021   Procedures:  Procedure(s) (LRB): Right REVERSE SHOULDER ARTHROPLASTY (Right)  Attending Physician:  Dr. Esmond Plants  Admission Diagnoses:   right shoulder end stage OA  Discharge Diagnoses:  same   Past Medical History:  Diagnosis Date   Allergy    Angio-edema    Arthritis    L hip   Atypical mole 01/21/2014   SEBACECIOUS ATYPIA RIGHT SCALP = MOHS   Heart murmur    Hypertension    Pneumonia    Scleroderma ([place])    Urticaria     PCP: Minette Brine, FNP   Discharged Condition: good  Hospital Course:  Patient underwent the above stated procedure on 12/15/2021. Patient tolerated the procedure well and brought to the recovery room in good condition and subsequently to the floor. Patient had an uncomplicated hospital course and was stable for discharge.   Disposition: Discharge disposition: 01-Home or Self Care      with follow up in 2 weeks    Follow-up Information     Netta Cedars, MD. Schedule an appointment as soon as possible for a visit in 2 week(s).   Specialty: Orthopedic Surgery Why: call [phone] for appt Contact information: [address] [place] [address] [phone]                 Dental Antibiotics:  In most cases prophylactic  antibiotics for Dental procdeures after total joint surgery are not necessary.  Exceptions are as follows:  1. History of prior total joint infection  2. Severely immunocompromised (Organ Transplant, cancer chemotherapy, Rheumatoid biologic meds such as [place])  3. Poorly controlled diabetes (A1C &gt; 8.0, blood glucose over 200)  If you have one of these conditions, contact your surgeon for an antibiotic prescription, prior to your dental procedure.  Discharge Instructions     Call MD / Call 911   Complete by: As directed    If you experience chest pain or shortness of breath, CALL 911 and be transported to the hospital emergency room.  If you develope a fever above 101 F, pus (white drainage) or increased drainage or redness at the wound, or calf pain, call your surgeon's office.   Constipation Prevention   Complete by: As directed    Drink plenty of fluids.  Prune juice may be helpful.  You may use a stool softener, such as Colace (over the counter) 100 mg twice a day.  Use MiraLax (over the counter) for constipation as needed.   Diet - low sodium heart healthy   Complete by: As directed    Increase activity slowly as tolerated   Complete by: As directed    Post-operative opioid taper instructions:   Complete by: As directed    POST-OPERATIVE OPIOID TAPER INSTRUCTIONS: It is important to wean off of your opioid medication as soon as possible. If you do not need pain  medication after your surgery it is ok to stop day one. Opioids include: Codeine, Hydrocodone(Norco, Vicodin), Oxycodone(Percocet, oxycontin) and hydromorphone amongst others.  Long term and even short term use of opiods can cause: Increased pain response Dependence Constipation Depression Respiratory depression And more.  Withdrawal symptoms can include Flu like symptoms Nausea, vomiting And more Techniques to manage these symptoms Hydrate well Eat regular healthy meals Stay active Use relaxation  techniques(deep breathing, meditating, yoga) Do Not substitute Alcohol to help with tapering If you have been on opioids for less than two weeks and do not have pain than it is ok to stop all together.  Plan to wean off of opioids This plan should start within one week post op of your joint replacement. Maintain the same interval or time between taking each dose and first decrease the dose.  Cut the total daily intake of opioids by one tablet each day Next start to increase the time between doses. The last dose that should be eliminated is the evening dose.          Allergies as of 12/16/2021       Reactions   Bee Venom Shortness Of Breath   Ivp Dye [iodinated Contrast Media] Shortness Of Breath, Rash   Other Rash   Contrast dye   Penicillins Hives, Rash   Tolerated Cephalosporin Date: 07/09/19.          Medication List     TAKE these medications    amLODipine 5 MG tablet Commonly known as: NORVASC TAKE 1 TABLET BY MOUTH ONCE DAILY   HYDROcodone-acetaminophen 5-325 MG tablet Commonly known as: Norco Take 1-2 tablets by mouth every 6 (six) hours as needed for moderate pain or severe pain.   lisinopril 20 MG tablet Commonly known as: ZESTRIL TAKE 1 TABLET(20 MG) BY MOUTH DAILY   methocarbamol 500 MG tablet Commonly known as: ROBAXIN Take 1 tablet (500 mg total) by mouth every 8 (eight) hours as needed for muscle spasms.   mometasone 0.1 % cream Commonly known as: ELOCON Apply 1 Application topically daily as needed (rash).   ondansetron 4 MG tablet Commonly known as: Zofran Take 1 tablet (4 mg total) by mouth every 8 (eight) hours as needed for vomiting, nausea or refractory nausea / vomiting.          Signed: [name] 12/16/2021, 7:51 AM  [facility] is now [facility] [address], [place], [place], [place] [address] Phone: [place]

## 2021-12-22 ENCOUNTER — Ambulatory Visit: Payer: Self-pay

## 2021-12-22 NOTE — Patient Outreach (Signed)
  Care Coordination   Initial Visit Note   12/22/2021 Name: Linda Allen MRN: [id number] DOB: 1945/08/09  Linda Allen is a 77 y.o. year old female who sees Linda Allen, [place] for primary care. I spoke with  Linda Allen by phone today.  What matters to the patients health and wellness today?  No concerns, doing well at this time.    Goals Addressed             This Visit's Progress    COMPLETED: Care Coordination Activities       Care Coordination Interventions: SDoH screening performed - no acute resources challenges identified at this time Determined the patient does not have concerns with medication costs and or adherence Performed chart review to note patient recently hospitalized - patient reports she is doing well at this time Education provided on the role of the care coordination team - no follow up desired at this time Advised the patient to contact her primary care provider as needed         SDOH assessments and interventions completed:  Yes  SDOH Interventions Today    Flowsheet Row Most Recent Value  SDOH Interventions   Food Insecurity Interventions Intervention Not Indicated  Housing Interventions Intervention Not Indicated  Transportation Interventions Intervention Not Indicated  Utilities Interventions Intervention Not Indicated        Care Coordination Interventions:  Yes, provided   Follow up plan: No further intervention required.   Encounter Outcome:  Pt. Visit Completed   Linda Allen, BSW, CDP Social Worker, Certified Dementia Practitioner [facility]  Care Coordination [phone]

## 2021-12-22 NOTE — Patient Instructions (Signed)
Visit Information  Thank you for taking time to visit with me today. Please don't hesitate to contact me if I can be of assistance to you.   Following are the goals we discussed today:   Goals Addressed             This Visit's Progress    COMPLETED: Care Coordination Activities       Care Coordination Interventions: SDoH screening performed - no acute resources challenges identified at this time Determined the patient does not have concerns with medication costs and or adherence Performed chart review to note patient recently hospitalized - patient reports she is doing well at this time Education provided on the role of the care coordination team - no follow up desired at this time Advised the patient to contact her primary care provider as needed         If you are experiencing a Mental Health or [place] or need someone to talk to, please call 1-800-273-TALK (toll free, 24 hour hotline) call 911  Patient verbalizes understanding of instructions and care plan provided today and agrees to view in [place]. Active MyChart status and patient understanding of how to access instructions and care plan via MyChart confirmed with patient.     No further follow up required: Please contact your primary care provider as needed.  Daneen Schick, BSW, CDP Social Worker, Certified Dementia Practitioner [facility]  Care Coordination [phone]

## 2021-12-29 DIAGNOSIS — Z4789 Encounter for other orthopedic aftercare: Secondary | ICD-10-CM | POA: Diagnosis not present

## 2022-01-26 DIAGNOSIS — Z4789 Encounter for other orthopedic aftercare: Secondary | ICD-10-CM | POA: Diagnosis not present

## 2022-02-01 NOTE — Progress Notes (Signed)
Follow Up Note  RE: Linda Allen MRN: [id number] DOB: [birth year] Date of Office Visit: 02/02/2022  Referring provider: Arnette Felts, FNP Primary care provider: Arnette Felts, FNP  Chief Complaint: Rash (Patient has pictures - was given mometasone cream and hydroxyzine ), Pruritus (Comes and goes month to month ), and Other (Had cellulitis from her dog scratching her left arm. Antibiotics helped the site has gone through trama 3 times and has not fully recovered. )  History of Present Illness: I had the pleasure of seeing Linda Allen for a follow up visit at the [facility] on 02/02/2022. She is a 77 y.o. female, who is being followed for rash. Her previous allergy office visit was on 01/01/2020 with Dr. Selena Batten. Today is a regular follow up visit.  Rash Patient needs refills on hydroxyzine and mometasone. She has been using the same Rx since 2021.  Patient's dog scratched her on the left arm 2 year ago and had cellulitis. This was treated with antibiotics. Last year she scratched the same area and developed cellulitis again.  1 month ago she scratched it again and only had to use bacitracin for this however this has redness around the area.   Still breaking out on the back, shoulders, flank area. Using mometasone and hydroxyzine prn with good benefit.  The breakouts occur with no trigger and not sure why she is breaking out. This occurs a few times per year and not as bad as before.  She had skin biopsy with no clear identifiable diagnosis.   Using fragrance free and dye free products.  Patient just had shoulder surgery.   Assessment and Plan: Zitlally is a 77 y.o. female with: Rash and other nonspecific skin eruption Past history - 2 months history of whole body pruritic rash. Only completely resolved after prednisone. Now using benadryl and topical cortisone with minimal benefit. Skin biopsy by dermatology showed "consistent with eczematous dermatitis  such as contact or nummular dermatitis." No prior history of rash except for breaking out in hives 2 years ago. Only significant change in her medical history is that she had left hip replaced in June 2021 and developed a milder rash around the area in June and had left shoulder replaced in September 2021. 2021 Bloodwork (CBC diff, CMP, TSH, ANA, ESR, CRP, CU, tryptase, Alpha gal, basic food panel) all normal.  Interim history - biopsy apparently inconclusive per patient. Breaking out less often with no known triggers. Mometasone and hydroxyzine helps.  Etiology unclear.  Continue proper skin care.  Start prednisone 20mg  once a day for 5 days to help with current rash flare on the left arm. It does not look infected. No need for antibiotics.  Use mometasone 0.1% cream once a day as needed for rash flares. Do not use on the face, neck, armpits or groin area. Do not use more than 1 week in a row. Itching: Take hydroxyzine 10mg  to 20mg  1 hour before bed - this may cause drowsiness If worsening then consider patch testing next (True and metal).  Return in about 1 year (around 02/03/2023).  Meds ordered this encounter  Medications   predniSONE (DELTASONE) 20 MG tablet    Sig: Take 1 tablet (20 mg total) by mouth daily with breakfast.    Dispense:  5 tablet    Refill:  0   mometasone (ELOCON) 0.1 % ointment    Sig: Apply topically daily as needed (rash). For thick, stubborn areas. Do not use on  the face, neck, armpits or groin area. Do not use more than 2 weeks in a row.    Dispense:  45 g    Refill:  3   hydrOXYzine (ATARAX) 10 MG tablet    Sig: Take 1 tablet (10 mg total) by mouth 3 (three) times daily as needed for itching.    Dispense:  60 tablet    Refill:  2   Lab Orders  No laboratory test(s) ordered today    Diagnostics: None.   Medication List:  Current Outpatient Medications  Medication Sig Dispense Refill   amLODipine (NORVASC) 5 MG tablet TAKE 1 TABLET BY MOUTH ONCE DAILY 90  tablet 1   hydrOXYzine (ATARAX) 10 MG tablet Take 1 tablet (10 mg total) by mouth 3 (three) times daily as needed for itching. 60 tablet 2   lisinopril (ZESTRIL) 20 MG tablet TAKE 1 TABLET(20 MG) BY MOUTH DAILY 90 tablet 1   mometasone (ELOCON) 0.1 % ointment Apply topically daily as needed (rash). For thick, stubborn areas. Do not use on the face, neck, armpits or groin area. Do not use more than 2 weeks in a row. 45 g 3   predniSONE (DELTASONE) 20 MG tablet Take 1 tablet (20 mg total) by mouth daily with breakfast. 5 tablet 0   No current facility-administered medications for this visit.   Allergies: Allergies  Allergen Reactions   Bee Venom Shortness Of Breath   Ivp Dye [Iodinated Contrast Media] Shortness Of Breath and Rash   Other Rash    Contrast dye   Penicillins Hives and Rash    Tolerated Cephalosporin Date: 07/09/19.     I reviewed her past medical history, social history, family history, and environmental history and no significant changes have been reported from her previous visit.  Review of Systems  Constitutional:  Negative for appetite change, chills, fever and unexpected weight change.  HENT:  Negative for congestion and rhinorrhea.   Eyes:  Negative for itching.  Respiratory:  Negative for cough, chest tightness, shortness of breath and wheezing.   Gastrointestinal:  Negative for abdominal pain.  Skin:  Positive for rash.  Neurological:  Negative for headaches.    Objective: BP 130/78   Pulse 80   Temp 98.4 F (36.9 C)   Resp 18   Ht 5\' 8"  (1.727 m)   Wt 264 lb 8 oz (120 kg)   SpO2 98%   BMI 40.22 kg/m  Body mass index is 40.22 kg/m. Physical Exam Vitals and nursing note reviewed.  Constitutional:      Appearance: Normal appearance. She is well-developed. She is obese.  HENT:     Head: Normocephalic and atraumatic.     Right Ear: Tympanic membrane and external ear normal.     Left Ear: Tympanic membrane and external ear normal.     Nose: Nose  normal.     Mouth/Throat:     Mouth: Mucous membranes are moist.     Pharynx: Oropharynx is clear.  Eyes:     Conjunctiva/sclera: Conjunctivae normal.  Cardiovascular:     Rate and Rhythm: Normal rate and regular rhythm.     Heart sounds: Murmur heard.  Pulmonary:     Effort: Pulmonary effort is normal.     Breath sounds: Normal breath sounds. No wheezing, rhonchi or rales.  Musculoskeletal:     Cervical back: Neck supple.  Skin:    General: Skin is warm.     Findings: Rash present.     Comments: Erythematous patch  on left dorsal area of upper extremities. Not warm to touch.  Neurological:     Mental Status: She is alert and oriented to person, place, and time.  Psychiatric:        Behavior: Behavior normal.    Previous notes and tests were reviewed. The plan was reviewed with the patient/family, and all questions/concerned were addressed.  It was my pleasure to see Linda Allen today and participate in her care. Please feel free to contact me with any questions or concerns.  Sincerely,  Wyline Mood, DO Allergy & Immunology  [facility] office: [phone] [facility] office: [phone]

## 2022-02-02 ENCOUNTER — Other Ambulatory Visit: Payer: Self-pay

## 2022-02-02 ENCOUNTER — Ambulatory Visit: Payer: Medicare PPO | Admitting: Allergy

## 2022-02-02 ENCOUNTER — Encounter: Payer: Self-pay | Admitting: Allergy

## 2022-02-02 VITALS — BP 130/78 | HR 80 | Temp 98.4°F | Resp 18 | Ht 68.0 in | Wt 264.5 lb

## 2022-02-02 DIAGNOSIS — R21 Rash and other nonspecific skin eruption: Secondary | ICD-10-CM | POA: Diagnosis not present

## 2022-02-02 MED ORDER — PREDNISONE 20 MG PO TABS: 20.0000 mg | ORAL_TABLET | Freq: Every day | ORAL | 0 refills | Status: DC

## 2022-02-02 MED ORDER — MOMETASONE FUROATE 0.1 % EX OINT: TOPICAL_OINTMENT | Freq: Every day | CUTANEOUS | 3 refills | Status: DC | PRN

## 2022-02-02 MED ORDER — HYDROXYZINE HCL 10 MG PO TABS: 10.0000 mg | ORAL_TABLET | Freq: Three times a day (TID) | ORAL | 2 refills | Status: AC | PRN

## 2022-02-02 NOTE — Patient Instructions (Signed)
Rash:  Etiology unclear.  Continue proper skin care.  Start prednisone '20mg'$  once a day for 5 days to help with current rash flare on the left arm. It does not look infected. No need for antibiotics.   Use mometasone 0.1% cream once a day as needed for rash flares. Do not use on the face, neck, armpits or groin area. Do not use more than 1 week in a row.  Itching: Take hydroxyzine '10mg'$  to '20mg'$  1 hour before bed - this may cause drowsiness If worsening then consider patch testing next.  Patches are best placed on Monday with return to office on Wednesday and Friday of same week for readings.  Patches once placed should not get wet.  You do not have to stop any medications for patch testing but should not be on oral prednisone. You can schedule a patch testing visit when convenient for your schedule.    True Test looks for the following sensitivities:      Follow up in 1 year or sooner if needed.   Skin care recommendations  Bath time: Always use lukewarm water. AVOID very hot or cold water. Keep bathing time to 5-10 minutes. Do NOT use bubble bath. Use a mild soap and use just enough to wash the dirty areas. Do NOT scrub skin vigorously.  After bathing, pat dry your skin with a towel. Do NOT rub or scrub the skin.  Moisturizers and prescriptions:  ALWAYS apply moisturizers immediately after bathing (within 3 minutes). This helps to lock-in moisture. Use the moisturizer several times a day over the whole body. Good summer moisturizers include: Aveeno, CeraVe, Cetaphil. Good winter moisturizers include: Aquaphor, Vaseline, Cerave, Cetaphil, Eucerin, Vanicream. When using moisturizers along with medications, the moisturizer should be applied about one hour after applying the medication to prevent diluting effect of the medication or moisturize around where you applied the medications. When not using medications, the moisturizer can be continued twice daily as maintenance.  Laundry and  clothing: Avoid laundry products with added color or perfumes. Use unscented hypo-allergenic laundry products such as Tide free, Cheer free & gentle, and All free and clear.  If the skin still seems dry or sensitive, you can try double-rinsing the clothes. Avoid tight or scratchy clothing such as wool. Do not use fabric softeners or dyer sheets.

## 2022-02-02 NOTE — Assessment & Plan Note (Signed)
Past history - 2 months history of whole body pruritic rash. Only completely resolved after prednisone. Now using benadryl and topical cortisone with minimal benefit. Skin biopsy by dermatology showed "consistent with eczematous dermatitis such as contact or nummular dermatitis." No prior history of rash except for breaking out in hives 2 years ago. Only significant change in her medical history is that she had left hip replaced in June 2021 and developed a milder rash around the area in June and had left shoulder replaced in September 2021. 2021 Bloodwork (CBC diff, CMP, TSH, ANA, ESR, CRP, CU, tryptase, Alpha gal, basic food panel) all normal.  Interim history - biopsy apparently inconclusive per patient. Breaking out less often with no known triggers. Mometasone and hydroxyzine helps.  Etiology unclear.  Continue proper skin care.  Start prednisone '20mg'$  once a day for 5 days to help with current rash flare on the left arm. It does not look infected. No need for antibiotics.  Use mometasone 0.1% cream once a day as needed for rash flares. Do not use on the face, neck, armpits or groin area. Do not use more than 1 week in a row. Itching: Take hydroxyzine '10mg'$  to '20mg'$  1 hour before bed - this may cause drowsiness If worsening then consider patch testing next (True and metal).

## 2022-02-07 ENCOUNTER — Encounter: Payer: Self-pay | Admitting: Nurse Practitioner

## 2022-02-07 ENCOUNTER — Ambulatory Visit (INDEPENDENT_AMBULATORY_CARE_PROVIDER_SITE_OTHER): Payer: Medicare PPO | Admitting: Nurse Practitioner

## 2022-02-07 VITALS — BP 140/88 | HR 76 | Temp 97.9°F | Ht 68.0 in | Wt 269.0 lb

## 2022-02-07 DIAGNOSIS — I10 Essential (primary) hypertension: Secondary | ICD-10-CM | POA: Diagnosis not present

## 2022-02-07 DIAGNOSIS — Z6841 Body Mass Index (BMI) 40.0 and over, adult: Secondary | ICD-10-CM | POA: Diagnosis not present

## 2022-02-07 DIAGNOSIS — Z23 Encounter for immunization: Secondary | ICD-10-CM | POA: Diagnosis not present

## 2022-02-07 DIAGNOSIS — E7800 Pure hypercholesterolemia, unspecified: Secondary | ICD-10-CM

## 2022-02-07 NOTE — Progress Notes (Unsigned)
I,Tianna Badgett,acting as a Education administrator for [employer], FNP.,have documented all relevant documentation on the behalf of Minette Brine, FNP,as directed by  Minette Brine, FNP while in the presence of Minette Brine, [place].  Subjective:     Patient ID: Linda Allen , female    DOB: 1945/07/18 , 77 y.o.   MRN: [id number]   Chief Complaint  Patient presents with   Hypertension    HPI  Patient here for a blood pressure f/u. She has not been able to walk as much due to her right shoulder pain - she is to start physical therapy this week tomorrow. She took her blood pressure medications late this morning.      Hypertension This is a chronic problem. The current episode started more than 1 year ago. The problem is uncontrolled. Pertinent negatives include no anxiety, blurred vision, chest pain, headaches, peripheral edema or shortness of breath. There are no associated agents to hypertension. Risk factors for coronary artery disease include obesity, sedentary lifestyle and post-menopausal state. Past treatments include nothing. There are no compliance problems.  There is no history of angina, kidney disease or CVA. There is no history of chronic renal disease.     Past Medical History:  Diagnosis Date   Allergy    Angio-edema    Arthritis    L hip   Atypical mole 01/21/2014   SEBACECIOUS ATYPIA RIGHT SCALP = MOHS   Heart murmur    Hypertension    Pneumonia    Scleroderma ([place])    Urticaria      Family History  Problem Relation Age of Onset   CVA Mother    Parkinson's disease Father    CVA Father    Heart attack Father    Eczema Sister    Ulcerative colitis Brother    Heart failure Paternal Grandmother      Current Outpatient Medications:    amLODipine (NORVASC) 5 MG tablet, TAKE 1 TABLET BY MOUTH ONCE DAILY, Disp: 90 tablet, Rfl: 1   hydrOXYzine (ATARAX) 10 MG tablet, Take 1 tablet (10 mg total) by mouth 3 (three) times daily as needed for itching., Disp: 60 tablet, Rfl:  2   lisinopril (ZESTRIL) 20 MG tablet, TAKE 1 TABLET(20 MG) BY MOUTH DAILY, Disp: 90 tablet, Rfl: 1   mometasone (ELOCON) 0.1 % ointment, Apply topically daily as needed (rash). For thick, stubborn areas. Do not use on the face, neck, armpits or groin area. Do not use more than 2 weeks in a row., Disp: 45 g, Rfl: 3   Allergies  Allergen Reactions   Bee Venom Shortness Of Breath   Ivp Dye [Iodinated Contrast Media] Shortness Of Breath and Rash   Other Rash    Contrast dye   Penicillins Hives and Rash    Tolerated Cephalosporin Date: 07/09/19.       Review of Systems  Constitutional: Negative.   Eyes:  Negative for blurred vision.  Respiratory: Negative.  Negative for shortness of breath.   Cardiovascular: Negative.  Negative for chest pain.  Gastrointestinal: Negative.   Neurological: Negative.  Negative for headaches.     Today's Vitals   02/07/22 1205  BP: (!) 140/88  Pulse: 76  Temp: 97.9 F (36.6 C)  TempSrc: Oral  Weight: 269 lb (122 kg)  Height: '5\' 8"'$  (1.727 m)  PainSc: 4   PainLoc: Shoulder   Body mass index is 40.9 kg/m.   Objective:  Physical Exam Vitals reviewed.  Constitutional:      General:  She is not in acute distress.    Appearance: Normal appearance. She is obese.  Cardiovascular:     Rate and Rhythm: Normal rate and regular rhythm.     Pulses: Normal pulses.     Heart sounds: Murmur (3/6) heard.  Pulmonary:     Effort: Pulmonary effort is normal. No respiratory distress.     Breath sounds: Normal breath sounds. No wheezing.  Musculoskeletal:        General: No swelling or tenderness.     Cervical back: Normal range of motion and neck supple.     Comments: Right shoulder limited ROM  Skin:    General: Skin is warm and dry.     Capillary Refill: Capillary refill takes less than 2 seconds.  Neurological:     General: No focal deficit present.     Mental Status: She is alert and oriented to person, place, and time.     Cranial Nerves: No  cranial nerve deficit.     Motor: No weakness.  Psychiatric:        Mood and Affect: Mood normal.        Behavior: Behavior normal.        Thought Content: Thought content normal.        Judgment: Judgment normal.         Assessment And Plan:     1. Essential hypertension Comments: Blood pressure is elevated.  She has had recent shoulder surgery and is currently having pain.  It encouraged to increase physical activity as tolerated  2. Elevated cholesterol Comments: [place] levels are stable.  Continue current medications. - Lipid panel  3. Morbid obesity ([place]) She is encouraged to strive for BMI less than 30 to decrease cardiac risk. Advised to aim for at least 150 minutes of exercise per week.  4. Need for influenza vaccination Influenza vaccine administered Encouraged to take Tylenol as needed for fever or muscle aches. - Flu vaccine HIGH DOSE PF (Fluzone High dose)  5. Encounter for immunization Prevnar 20 given in office - Pneumococcal conjugate vaccine 20-valent (Prevnar 20)     Patient was given opportunity to ask questions. Patient verbalized understanding of the plan and was able to repeat key elements of the plan. All questions were answered to their satisfaction.  Minette Brine, FNP    I, Minette Brine, FNP, have reviewed all documentation for this visit. The documentation on 02/07/22 for the exam, diagnosis, procedures, and orders are all accurate and complete.  IF YOU HAVE BEEN REFERRED TO A SPECIALIST, IT MAY TAKE 1-2 WEEKS TO SCHEDULE/PROCESS THE REFERRAL. IF YOU HAVE NOT HEARD FROM US/SPECIALIST IN TWO WEEKS, PLEASE GIVE [place] A CALL AT [phone] X [phone].   THE PATIENT IS ENCOURAGED TO PRACTICE SOCIAL DISTANCING DUE TO THE COVID-19 PANDEMIC.

## 2022-02-07 NOTE — Patient Instructions (Signed)
Hypertension, Adult High blood pressure (hypertension) is when the force of blood pumping through the arteries is too strong. The arteries are the blood vessels that carry blood from the heart throughout the body. Hypertension forces the heart to work harder to pump blood and may cause arteries to become narrow or stiff. Untreated or uncontrolled hypertension can lead to a heart attack, heart failure, a stroke, kidney disease, and other problems. A blood pressure reading consists of a higher number over a lower number. Ideally, your blood pressure should be below 120/80. The first ("top") number is called the systolic pressure. It is a measure of the pressure in your arteries as your heart beats. The second ("bottom") number is called the diastolic pressure. It is a measure of the pressure in your arteries as the heart relaxes. What are the causes? The exact cause of this condition is not known. There are some conditions that result in high blood pressure. What increases the risk? Certain factors may make you more likely to develop high blood pressure. Some of these risk factors are under your control, including: Smoking. Not getting enough exercise or physical activity. Being overweight. Having too much fat, sugar, calories, or salt (sodium) in your diet. Drinking too much alcohol. Other risk factors include: Having a personal history of heart disease, diabetes, high cholesterol, or kidney disease. Stress. Having a family history of high blood pressure and high cholesterol. Having obstructive sleep apnea. Age. The risk increases with age. What are the signs or symptoms? High blood pressure may not cause symptoms. Very high blood pressure (hypertensive crisis) may cause: Headache. Fast or irregular heartbeats (palpitations). Shortness of breath. Nosebleed. Nausea and vomiting. Vision changes. Severe chest pain, dizziness, and seizures. How is this diagnosed? This condition is diagnosed by  measuring your blood pressure while you are seated, with your arm resting on a flat surface, your legs uncrossed, and your feet flat on the floor. The cuff of the blood pressure monitor will be placed directly against the skin of your upper arm at the level of your heart. Blood pressure should be measured at least twice using the same arm. Certain conditions can cause a difference in blood pressure between your right and left arms. If you have a high blood pressure reading during one visit or you have normal blood pressure with other risk factors, you may be asked to: Return on a different day to have your blood pressure checked again. Monitor your blood pressure at home for 1 week or longer. If you are diagnosed with hypertension, you may have other blood or imaging tests to help your health care provider understand your overall risk for other conditions. How is this treated? This condition is treated by making healthy lifestyle changes, such as eating healthy foods, exercising more, and reducing your alcohol intake. You may be referred for counseling on a healthy diet and physical activity. Your health care provider may prescribe medicine if lifestyle changes are not enough to get your blood pressure under control and if: Your systolic blood pressure is above 130. Your diastolic blood pressure is above 80. Your personal target blood pressure may vary depending on your medical conditions, your age, and other factors. Follow these instructions at home: Eating and drinking  Eat a diet that is high in fiber and potassium, and low in sodium, added sugar, and fat. An example of this eating plan is called the DASH diet. DASH stands for Dietary Approaches to Stop Hypertension. To eat this way: Eat   plenty of fresh fruits and vegetables. Try to fill one half of your plate at each meal with fruits and vegetables. Eat whole grains, such as whole-wheat pasta, brown rice, or whole-grain bread. Fill about one  fourth of your plate with whole grains. Eat or drink low-fat dairy products, such as skim milk or low-fat yogurt. Avoid fatty cuts of meat, processed or cured meats, and poultry with skin. Fill about one fourth of your plate with lean proteins, such as fish, chicken without skin, beans, eggs, or tofu. Avoid pre-made and processed foods. These tend to be higher in sodium, added sugar, and fat. Reduce your daily sodium intake. Many people with hypertension should eat less than 1,500 mg of sodium a day. Do not drink alcohol if: Your health care provider tells you not to drink. You are pregnant, may be pregnant, or are planning to become pregnant. If you drink alcohol: Limit how much you have to: 0-1 drink a day for women. 0-2 drinks a day for men. Know how much alcohol is in your drink. In the [place] one drink equals one 12 oz bottle of beer (355 mL), one 5 oz glass of wine (148 mL), or one 1 oz glass of hard liquor (44 mL). Lifestyle  Work with your health care provider to maintain a healthy body weight or to lose weight. Ask what an ideal weight is for you. Get at least 30 minutes of exercise that causes your heart to beat faster (aerobic exercise) most days of the week. Activities may include walking, swimming, or biking. Include exercise to strengthen your muscles (resistance exercise), such as Pilates or lifting weights, as part of your weekly exercise routine. Try to do these types of exercises for 30 minutes at least 3 days a week. Do not use any products that contain nicotine or tobacco. These products include cigarettes, chewing tobacco, and vaping devices, such as e-cigarettes. If you need help quitting, ask your health care provider. Monitor your blood pressure at home as told by your health care provider. Keep all follow-up visits. This is important. Medicines Take over-the-counter and prescription medicines only as told by your health care provider. Follow directions carefully. Blood  pressure medicines must be taken as prescribed. Do not skip doses of blood pressure medicine. Doing this puts you at risk for problems and can make the medicine less effective. Ask your health care provider about side effects or reactions to medicines that you should watch for. Contact a health care provider if you: Think you are having a reaction to a medicine you are taking. Have headaches that keep coming back (recurring). Feel dizzy. Have swelling in your ankles. Have trouble with your vision. Get help right away if you: Develop a severe headache or confusion. Have unusual weakness or numbness. Feel faint. Have severe pain in your chest or abdomen. Vomit repeatedly. Have trouble breathing. These symptoms may be an emergency. Get help right away. Call 911. Do not wait to see if the symptoms will go away. Do not drive yourself to the hospital. Summary Hypertension is when the force of blood pumping through your arteries is too strong. If this condition is not controlled, it may put you at risk for serious complications. Your personal target blood pressure may vary depending on your medical conditions, your age, and other factors. For most people, a normal blood pressure is less than 120/80. Hypertension is treated with lifestyle changes, medicines, or a combination of both. Lifestyle changes include losing weight, eating a healthy,   low-sodium diet, exercising more, and limiting alcohol. This information is not intended to replace advice given to you by your health care provider. Make sure you discuss any questions you have with your health care provider. Document Revised: 11/03/2020 Document Reviewed: 11/03/2020 Elsevier Patient Education  2023 Elsevier Inc.  

## 2022-02-08 DIAGNOSIS — M25511 Pain in right shoulder: Secondary | ICD-10-CM | POA: Diagnosis not present

## 2022-02-17 ENCOUNTER — Telehealth: Payer: Self-pay

## 2022-02-17 NOTE — Telephone Encounter (Signed)
Per provider patient will need to seek evaluation at [facility], either in person or virtual visit.   Patient has been advised and agrees.

## 2022-02-17 NOTE — Telephone Encounter (Signed)
Patient reports skin tear to her left forearm on Monday. She scraped it on her fence gate. She is concerned because the area is red, inflamed and warm. She denies any radiating redness/swelling/pain, fevers/nausea/vomiting. She would like to know if antibiotics could be sent in for her (no OV appts available at this time). She states she took atbx in the past for another arm wound and the pills were too large for her to swallow, she does not want this medication again.  Please advise, thank you!

## 2022-02-18 ENCOUNTER — Telehealth: Payer: Medicare PPO | Admitting: Physician Assistant

## 2022-02-18 DIAGNOSIS — L03114 Cellulitis of left upper limb: Secondary | ICD-10-CM | POA: Diagnosis not present

## 2022-02-18 MED ORDER — DOXYCYCLINE HYCLATE 100 MG PO CAPS: 100.0000 mg | ORAL_CAPSULE | Freq: Two times a day (BID) | ORAL | 0 refills | Status: DC

## 2022-02-18 NOTE — Progress Notes (Signed)
Virtual Visit Consent   [name], you are scheduled for a virtual visit with a [facility] provider today. Just as with appointments in the office, your consent must be obtained to participate. Your consent will be active for this visit and any virtual visit you may have with one of our providers in the next 365 days. If you have a MyChart account, a copy of this consent can be sent to you electronically.  As this is a virtual visit, video technology does not allow for your provider to perform a traditional examination. This may limit your provider's ability to fully assess your condition. If your provider identifies any concerns that need to be evaluated in person or the need to arrange testing (such as labs, EKG, etc.), we will make arrangements to do so. Although advances in technology are sophisticated, we cannot ensure that it will always work on either your end or our end. If the connection with a video visit is poor, the visit may have to be switched to a telephone visit. With either a video or telephone visit, we are not always able to ensure that we have a secure connection.  By engaging in this virtual visit, you consent to the provision of healthcare and authorize for your insurance to be billed (if applicable) for the services provided during this visit. Depending on your insurance coverage, you may receive a charge related to this service.  I need to obtain your verbal consent now. Are you willing to proceed with your visit today? Linda Allen has provided verbal consent on 02/18/2022 for a virtual visit (video or telephone). Mar Daring, PA-C  Date: 02/18/2022 11:44 AM  Virtual Visit via Video Note   I, Mar Daring, connected with  Linda Allen  ([id number], 09/07/1945) on 02/18/22 at 11:30 AM EST by a video-enabled telemedicine application and verified that I am speaking with the correct person using two identifiers.  Location: Patient: Virtual  Visit Location Patient: Home Provider: Virtual Visit Location Provider: Home Office   I discussed the limitations of evaluation and management by telemedicine and the availability of in person appointments. The patient expressed understanding and agreed to proceed.    History of Present Illness: Linda Allen is a 77 y.o. who identifies as a female who was assigned female at birth, and is being seen today for laceration to left arm. Happened Monday, 02/14/22. About the size of a quarter. Now having redness, warmth, swelling, and tenderness. Has had similar issues with skin tears that caused cellulitis in the past and feels this is starting to become infected like that. Denies fevers, chills, nausea, vomiting, purulent drainage. Does have a history of a dog bite in that same area in 2022. Had severe infection and had Keflex but no improvement and had to retreat with Doxycycline. Then re-injured last year over same area while she was in [place] and had to be treated again. Area over the wound is very thin. She does see Dermatology and they have advised there is nothing that can be done except keep area well moisturized and covered as needed to avoid hitting and tearing the skin. This time she hit her arm on a gate and had 3 layers of clothing overlying but was still significant enough to tear the skin again.    Problems:  Patient Active Problem List   Diagnosis Date Noted   S/P shoulder replacement, right 12/15/2021   Rash and other nonspecific skin eruption 01/01/2020  Pruritus 01/01/2020   Left hip OA 07/09/2019   S/P left THA, AA 07/09/2019   S/P hip replacement, left 07/09/2019   Osteoarthritis of left shoulder 04/04/2018   Arthritis L hip, R shoulder, both knees    MORBID OBESITY 01/12/2010   Essential hypertension 12/22/2008   MURMUR 12/22/2008    Allergies:  Allergies  Allergen Reactions   Bee Venom Shortness Of Breath   Ivp Dye [Iodinated Contrast Media] Shortness Of  Breath and Rash   Other Rash    Contrast dye   Penicillins Hives and Rash    Tolerated Cephalosporin Date: 07/09/19.     Medications:  Current Outpatient Medications:    doxycycline (VIBRAMYCIN) 100 MG capsule, Take 1 capsule (100 mg total) by mouth 2 (two) times daily., Disp: 14 capsule, Rfl: 0   amLODipine (NORVASC) 5 MG tablet, TAKE 1 TABLET BY MOUTH ONCE DAILY, Disp: 90 tablet, Rfl: 1   hydrOXYzine (ATARAX) 10 MG tablet, Take 1 tablet (10 mg total) by mouth 3 (three) times daily as needed for itching., Disp: 60 tablet, Rfl: 2   lisinopril (ZESTRIL) 20 MG tablet, TAKE 1 TABLET(20 MG) BY MOUTH DAILY, Disp: 90 tablet, Rfl: 1   mometasone (ELOCON) 0.1 % ointment, Apply topically daily as needed (rash). For thick, stubborn areas. Do not use on the face, neck, armpits or groin area. Do not use more than 2 weeks in a row., Disp: 45 g, Rfl: 3  Observations/Objective: Patient is well-developed, well-nourished in no acute distress.  Resting comfortably at home.  Head is normocephalic, atraumatic.  No labored breathing.  Speech is clear and coherent with logical content.  Patient is alert and oriented at baseline.  Wound is on left forearm on the dorsal surface just distal from the elbow. There is a larger area that measures approx 4 cm in diameter (based off video appearance) that is consistent with scarring from original injury (thin, pink skin). Centrally in this area there is an irregular skin tear (complete avulsion of skin) noted measuring approx 0.5 cm-1cm with red schar and surrounding erythema. Hard to appreciate swelling on the scar tissue in video, but is reported. Warmth and tenderness to touch are also reported  Assessment and Plan: 1. Cellulitis of left upper extremity - doxycycline (VIBRAMYCIN) 100 MG capsule; Take 1 capsule (100 mg total) by mouth 2 (two) times daily.  Dispense: 14 capsule; Refill: 0  - Doxycycline prescribed for recurrent skin tear with signs of early  cellulitis - Keep area clean and dry, just wash with soap and water twice daily - Bandage as necessary - Limit use of neosporin to avoid having too much moisture over the wound - Seek in person evaluation if continues to worsen or fails to improve/heal  Follow Up Instructions: I discussed the assessment and treatment plan with the patient. The patient was provided an opportunity to ask questions and all were answered. The patient agreed with the plan and demonstrated an understanding of the instructions.  A copy of instructions were sent to the patient via MyChart unless otherwise noted below.    The patient was advised to call back or seek an in-person evaluation if the symptoms worsen or if the condition fails to improve as anticipated.  Time:  I spent 11 minutes with the patient via telehealth technology discussing the above problems/concerns.    Mar Daring, PA-C

## 2022-02-18 NOTE — Patient Instructions (Signed)
[name], thank you for joining Mar Daring, PA-C for today's virtual visit.  While this provider is not your primary care provider (PCP), if your PCP is located in our provider database this encounter information will be shared with them immediately following your visit.   [facility] account gives you access to today's visit and all your visits, tests, and labs performed at [facility] " click here if you don't have a [facility] account or go to [email or web address]  Consent: (Patient) Linda Allen provided verbal consent for this virtual visit at the beginning of the encounter.  Current Medications:  Current Outpatient Medications:    doxycycline (VIBRAMYCIN) 100 MG capsule, Take 1 capsule (100 mg total) by mouth 2 (two) times daily., Disp: 14 capsule, Rfl: 0   amLODipine (NORVASC) 5 MG tablet, TAKE 1 TABLET BY MOUTH ONCE DAILY, Disp: 90 tablet, Rfl: 1   hydrOXYzine (ATARAX) 10 MG tablet, Take 1 tablet (10 mg total) by mouth 3 (three) times daily as needed for itching., Disp: 60 tablet, Rfl: 2   lisinopril (ZESTRIL) 20 MG tablet, TAKE 1 TABLET(20 MG) BY MOUTH DAILY, Disp: 90 tablet, Rfl: 1   mometasone (ELOCON) 0.1 % ointment, Apply topically daily as needed (rash). For thick, stubborn areas. Do not use on the face, neck, armpits or groin area. Do not use more than 2 weeks in a row., Disp: 45 g, Rfl: 3   Medications ordered in this encounter:  Meds ordered this encounter  Medications   doxycycline (VIBRAMYCIN) 100 MG capsule    Sig: Take 1 capsule (100 mg total) by mouth 2 (two) times daily.    Dispense:  14 capsule    Refill:  0    Order Specific Question:   Supervising Provider    Answer:   Chase Picket [id number]     *If you need refills on other medications prior to your next appointment, please contact your pharmacy*  Follow-Up: Call back or seek an in-person evaluation if the symptoms worsen or if the condition  fails to improve as anticipated.  [facility] [phone]  Other Instructions  Wound Care, Adult Taking care of your wound properly can help to prevent pain, infection, and scarring. It can also help your wound heal more quickly. Follow instructions from your health care provider about how to care for your wound. Supplies needed: Soap and water. Wound cleanser, saline, or germ-free (sterile) water. Gauze. If needed, a clean bandage (dressing) or other type of wound dressing material to cover or place in the wound. Follow your health care provider's instructions about what dressing supplies to use. Cream or topical ointment to apply to the wound, if told by your health care provider. How to care for your wound Cleaning the wound Ask your health care provider how to clean the wound. This may include: Using mild soap and water, a wound cleanser, saline, or sterile water. Using a clean gauze to pat the wound dry after cleaning it. Do not rub or scrub the wound. Dressing care Wash your hands with soap and water for at least 20 seconds before and after you change the dressing. If soap and water are not available, use hand sanitizer. Change your dressing as told by your health care provider. This may include: Cleaning or rinsing out (irrigating) the wound. Application of cream or topical ointment, if told by your health care provider. Placing a dressing over the wound or in the wound (packing).  Covering the wound with an outer dressing. Leave stitches (sutures), staples, skin glue, or adhesive strips in place. These skin closures may need to stay in place for 2 weeks or longer. If adhesive strip edges start to loosen and curl up, you may trim the loose edges. Do not remove adhesive strips completely unless your health care provider tells you to do that. Ask your health care provider when you can leave the wound uncovered. Checking for infection Check your wound area every day  for signs of infection. Check for: More redness, swelling, or pain. Fluid or blood. Warmth. Pus or a bad smell.  Follow these instructions at home Medicines If you were prescribed an antibiotic medicine, cream, or ointment, take or apply it as told by your health care provider. Do not stop using the antibiotic even if your condition improves. If you were prescribed pain medicine, take it 30 minutes before you do any wound care or as told by your health care provider. Take over-the-counter and prescription medicines only as told by your health care provider. Eating and drinking Eat a diet that includes protein, vitamin A, vitamin C, and other nutrient-rich foods to help the wound heal. Foods rich in protein include meat, fish, eggs, dairy, beans, and nuts. Foods rich in vitamin A include carrots and dark green, leafy vegetables. Foods rich in vitamin C include citrus fruits, tomatoes, broccoli, and peppers. Drink enough fluid to keep your urine pale yellow. General instructions Do not take baths, swim, or use a hot tub until your health care provider approves. Ask your health care provider if you may take showers. You may only be allowed to take sponge baths. Do not scratch or pick at the wound. Keep it covered as told by your health care provider. Return to your normal activities as told by your health care provider. Ask your health care provider what activities are safe for you. Protect your wound from the sun when you are outside for the first 6 months, or for as long as told by your health care provider. Cover up the scar area or apply sunscreen that has an SPF of at least 50. Do not use any products that contain nicotine or tobacco. These products include cigarettes, chewing tobacco, and vaping devices, such as e-cigarettes. If you need help quitting, ask your health care provider. Keep all follow-up visits. This is important. Contact a health care provider if: You received a tetanus  shot and you have swelling, severe pain, redness, or bleeding at the injection site. Your pain is not controlled with medicine. You have any of these signs of infection: More redness, swelling, or pain around the wound. Fluid or blood coming from the wound. Warmth coming from the wound. A fever or chills. You are nauseous or you vomit. You are dizzy. You have a new rash or hardness around the wound. Get help right away if: You have a red streak of skin near the area around your wound. Pus or a bad smell coming from the wound. Your wound has been closed with staples, sutures, skin glue, or adhesive strips and it begins to open up and separate. Your wound is bleeding, and the bleeding does not stop with gentle pressure. These symptoms may represent a serious problem that is an emergency. Do not wait to see if the symptoms will go away. Get medical help right away. Call your local emergency services (911 in the [place] Do not drive yourself to the hospital. Summary Always wash your hands  with soap and water for at least 20 seconds before and after changing your dressing. Change your dressing as told by your health care provider. To help with healing, eat foods that are rich in protein, vitamin A, vitamin C, and other nutrients. Check your wound every day for signs of infection. Contact your health care provider if you think that your wound is infected. This information is not intended to replace advice given to you by your health care provider. Make sure you discuss any questions you have with your health care provider. Document Revised: 05/05/2020 Document Reviewed: 05/05/2020 Elsevier Patient Education  [place].    If you have been instructed to have an in-person evaluation today at a local Urgent Care facility, please use the link below. It will take you to a list of all of our available [facility], including address, phone number and hours of operation. Please do  not delay care.  [facility]  If you or a family member do not have a primary care provider, use the link below to schedule a visit and establish care. When you choose a [facility] primary care physician or advanced practice provider, you gain a long-term partner in health. Find a Primary Care Provider  Learn more about [facility]'s in-office and virtual care options: [facility]

## 2022-03-08 ENCOUNTER — Other Ambulatory Visit: Payer: Self-pay | Admitting: Nurse Practitioner

## 2022-03-08 ENCOUNTER — Other Ambulatory Visit: Payer: Self-pay

## 2022-03-08 ENCOUNTER — Other Ambulatory Visit: Payer: Self-pay | Admitting: Allergy

## 2022-03-08 DIAGNOSIS — I10 Essential (primary) hypertension: Secondary | ICD-10-CM

## 2022-03-08 MED ORDER — LISINOPRIL 20 MG PO TABS: ORAL_TABLET | ORAL | 1 refills | Status: DC

## 2022-06-11 ENCOUNTER — Other Ambulatory Visit: Payer: Self-pay | Admitting: Nurse Practitioner

## 2022-06-11 DIAGNOSIS — I10 Essential (primary) hypertension: Secondary | ICD-10-CM

## 2022-06-13 ENCOUNTER — Ambulatory Visit: Payer: Medicare PPO | Admitting: Nurse Practitioner

## 2022-07-20 ENCOUNTER — Ambulatory Visit: Payer: Medicare PPO | Admitting: Nurse Practitioner

## 2022-08-15 ENCOUNTER — Encounter: Payer: Self-pay | Admitting: Nurse Practitioner

## 2022-08-15 ENCOUNTER — Ambulatory Visit: Payer: Medicare PPO | Admitting: Nurse Practitioner

## 2022-08-15 VITALS — BP 120/80 | HR 64 | Temp 98.4°F | Ht 68.0 in | Wt 271.0 lb

## 2022-08-15 DIAGNOSIS — I10 Essential (primary) hypertension: Secondary | ICD-10-CM

## 2022-08-15 DIAGNOSIS — R067 Sneezing: Secondary | ICD-10-CM | POA: Diagnosis not present

## 2022-08-15 DIAGNOSIS — Z91030 Bee allergy status: Secondary | ICD-10-CM | POA: Insufficient documentation

## 2022-08-15 DIAGNOSIS — Z6841 Body Mass Index (BMI) 40.0 and over, adult: Secondary | ICD-10-CM | POA: Diagnosis not present

## 2022-08-15 DIAGNOSIS — E782 Mixed hyperlipidemia: Secondary | ICD-10-CM

## 2022-08-15 DIAGNOSIS — E7800 Pure hypercholesterolemia, unspecified: Secondary | ICD-10-CM | POA: Diagnosis not present

## 2022-08-15 MED ORDER — EPINEPHRINE 0.3 MG/0.3ML IJ SOAJ
0.3000 mg | INTRAMUSCULAR | 2 refills | Status: AC | PRN
Start: 2022-08-15 — End: ?

## 2022-08-15 MED ORDER — EPINEPHRINE 0.3 MG/0.3ML IJ SOAJ
0.3000 mg | INTRAMUSCULAR | 2 refills | Status: DC | PRN
Start: 2022-08-15 — End: 2022-08-15

## 2022-08-15 MED ORDER — AMLODIPINE BESYLATE 5 MG PO TABS: ORAL_TABLET | ORAL | 1 refills | Status: DC

## 2022-08-15 NOTE — Assessment & Plan Note (Signed)
Cholesterol levels are elevated at last visit, continue limiting intake of fried or fatty foods.

## 2022-08-15 NOTE — Assessment & Plan Note (Signed)
B/P is well controlled, continue current medications CMP ordered to check renal function.  The importance of regular exercise and dietary modification was stressed to the patient.

## 2022-08-15 NOTE — Assessment & Plan Note (Signed)
She feels like she is on the mends, does not want any medications.

## 2022-08-15 NOTE — Assessment & Plan Note (Signed)
Rx sent for epipen has not had a refill in some time. Advised they usually expire after one year.

## 2022-08-15 NOTE — Assessment & Plan Note (Signed)
She is encouraged to strive for BMI less than 30 to decrease cardiac risk. Advised to aim for at least 150 minutes of exercise per week.  

## 2022-08-15 NOTE — Progress Notes (Signed)
Madelaine Bhat, CMA,acting as a Neurosurgeon for Arnette Felts, FNP.,have documented all relevant documentation on the behalf of Arnette Felts, FNP,as directed by  Arnette Felts, FNP while in the presence of Arnette Felts, FNP.  Subjective:  Patient ID: Linda Allen , female    DOB: Mar 08, 1945 , 77 y.o.   MRN: [id number]  Chief Complaint  Patient presents with   Hypertension    HPI  Patient presents today for a bp and chol follow up, patient reports compliance with medications. Patient denies any Chest pain, SOB, or headaches. Patient reports for the past 2 weeks she's had sneezing, cough, itchy face, and runny nose. Patient reports taking at home covid test that were negative - she has taken 3 covid test. She feels like she is getting better. She did not take any medications during that time.   BP Readings from Last 3 Encounters: 08/15/22 : 120/80 02/07/22 : (!) 140/88 02/02/22 : 130/78       Past Medical History:  Diagnosis Date   Allergy    Angio-edema    Arthritis    L hip   Atypical mole 01/21/2014   SEBACECIOUS ATYPIA RIGHT SCALP = MOHS   Heart murmur    Hypertension    Pneumonia    Scleroderma (HCC)    Urticaria      Family History  Problem Relation Age of Onset   CVA Mother    Parkinson's disease Father    CVA Father    Heart attack Father    Eczema Sister    Ulcerative colitis Brother    Heart failure Paternal Grandmother      Current Outpatient Medications:    hydrOXYzine (ATARAX) 10 MG tablet, Take 1 tablet (10 mg total) by mouth 3 (three) times daily as needed for itching., Disp: 60 tablet, Rfl: 2   lisinopril (ZESTRIL) 20 MG tablet, TAKE 1 TABLET(20 MG) BY MOUTH DAILY, Disp: 90 tablet, Rfl: 1   amLODipine (NORVASC) 5 MG tablet, TAKE 1 TABLET BY MOUTH ONCE DAILY, Disp: 90 tablet, Rfl: 1   EPINEPHrine 0.3 mg/0.3 mL IJ SOAJ injection, Inject 0.3 mg into the muscle as needed for anaphylaxis., Disp: 2 each, Rfl: 2   mometasone (ELOCON) 0.1 % ointment, APPLY  TWICE DAILY AS NEEDED. DO NOT USE ON THE FACE, NECK, ARMPITS OR GROIN AREA. DO NOT USE MORE THAN 3 WEEKS IN A ROW. STOP WHEN CLEAR. (Patient not taking: Reported on 08/15/2022), Disp: 180 g, Rfl: 0   Allergies  Allergen Reactions   Bee Venom Shortness Of Breath   Ivp Dye [Iodinated Contrast Media] Shortness Of Breath and Rash   Other Rash    Contrast dye   Penicillins Hives and Rash    Tolerated Cephalosporin Date: 07/09/19.       Review of Systems  Constitutional: Negative.   Respiratory: Negative.  Negative for shortness of breath.   Cardiovascular: Negative.  Negative for chest pain.  Gastrointestinal: Negative.   Neurological: Negative.  Negative for headaches.     Today's Vitals   08/15/22 1534  BP: 120/80  Pulse: 64  Temp: 98.4 F (36.9 C)  TempSrc: Oral  Weight: 271 lb (122.9 kg)  Height: 5\' 8"  (1.727 m)  PainSc: 0-No pain   Body mass index is 41.21 kg/m.  Wt Readings from Last 3 Encounters:  08/15/22 271 lb (122.9 kg)  02/07/22 269 lb (122 kg)  02/02/22 264 lb 8 oz (120 kg)      Objective:  Physical Exam Vitals reviewed.  Constitutional:      General: She is not in acute distress.    Appearance: Normal appearance. She is obese.  Cardiovascular:     Rate and Rhythm: Normal rate and regular rhythm.     Pulses: Normal pulses.     Heart sounds: Murmur (3/6) heard.  Pulmonary:     Effort: Pulmonary effort is normal. No respiratory distress.     Breath sounds: Normal breath sounds. No wheezing.  Musculoskeletal:     Cervical back: Normal range of motion and neck supple.  Skin:    General: Skin is warm and dry.     Capillary Refill: Capillary refill takes less than 2 seconds.  Neurological:     General: No focal deficit present.     Mental Status: She is alert and oriented to person, place, and time.     Cranial Nerves: No cranial nerve deficit.     Motor: No weakness.  Psychiatric:        Mood and Affect: Mood normal.        Behavior: Behavior normal.         Thought Content: Thought content normal.        Judgment: Judgment normal.         Assessment And Plan:  Essential hypertension Assessment & Plan: B/P is well controlled, continue current medications CMP ordered to check renal function.  The importance of regular exercise and dietary modification was stressed to the patient.      Orders: -     Lipid panel -     amLODIPine Besylate; TAKE 1 TABLET BY MOUTH ONCE DAILY  Dispense: 90 tablet; Refill: 1 -     CMP14+EGFR  Mixed hyperlipidemia Assessment & Plan: Cholesterol levels are elevated at last visit, continue limiting intake of fried or fatty foods.   Orders: -     Lipid panel -     CMP14+EGFR  Sneezing Assessment & Plan: She feels like she is on the mends, does not want any medications.    Morbid obesity with BMI of 40.0-44.9, adult [facility]) Assessment & Plan: She is encouraged to strive for BMI less than 30 to decrease cardiac risk. Advised to aim for at least 150 minutes of exercise per week.    H/O bee sting allergy Assessment & Plan: Rx sent for epipen has not had a refill in some time. Advised they usually expire after one year.  Orders: -     EPINEPHrine; Inject 0.3 mg into the muscle as needed for anaphylaxis.  Dispense: 2 each; Refill: 2   Return for has appt in Oct cancel appt with me in November and she would like to do her AWV via telephone.   Patient was given opportunity to ask questions. Patient verbalized understanding of the plan and was able to repeat key elements of the plan. All questions were answered to their satisfaction.    Linda Sparrow, FNP, have reviewed all documentation for this visit. The documentation on 08/15/22 for the exam, diagnosis, procedures, and orders are all accurate and complete.   IF YOU HAVE BEEN REFERRED TO A SPECIALIST, IT MAY TAKE 1-2 WEEKS TO SCHEDULE/PROCESS THE REFERRAL. IF YOU HAVE NOT HEARD FROM US/SPECIALIST IN TWO WEEKS, PLEASE GIVE [place] A CALL AT  [phone].

## 2022-09-21 DIAGNOSIS — H43813 Vitreous degeneration, bilateral: Secondary | ICD-10-CM | POA: Diagnosis not present

## 2022-09-21 DIAGNOSIS — H53143 Visual discomfort, bilateral: Secondary | ICD-10-CM | POA: Diagnosis not present

## 2022-09-21 DIAGNOSIS — H43393 Other vitreous opacities, bilateral: Secondary | ICD-10-CM | POA: Diagnosis not present

## 2022-09-21 DIAGNOSIS — H2513 Age-related nuclear cataract, bilateral: Secondary | ICD-10-CM | POA: Diagnosis not present

## 2022-09-21 DIAGNOSIS — H0014 Chalazion left upper eyelid: Secondary | ICD-10-CM | POA: Diagnosis not present

## 2022-09-21 DIAGNOSIS — H40053 Ocular hypertension, bilateral: Secondary | ICD-10-CM | POA: Diagnosis not present

## 2022-09-21 DIAGNOSIS — H524 Presbyopia: Secondary | ICD-10-CM | POA: Diagnosis not present

## 2022-10-11 ENCOUNTER — Encounter: Payer: Self-pay | Admitting: Nurse Practitioner

## 2022-10-11 ENCOUNTER — Ambulatory Visit: Payer: Medicare PPO | Admitting: Nurse Practitioner

## 2022-10-11 VITALS — BP 130/78 | HR 73 | Temp 97.8°F | Ht 68.0 in | Wt 262.6 lb

## 2022-10-11 DIAGNOSIS — Z0000 Encounter for general adult medical examination without abnormal findings: Secondary | ICD-10-CM | POA: Diagnosis not present

## 2022-10-11 DIAGNOSIS — Z79899 Other long term (current) drug therapy: Secondary | ICD-10-CM

## 2022-10-11 DIAGNOSIS — J0110 Acute frontal sinusitis, unspecified: Secondary | ICD-10-CM

## 2022-10-11 DIAGNOSIS — E66812 Obesity, class 2: Secondary | ICD-10-CM

## 2022-10-11 DIAGNOSIS — I10 Essential (primary) hypertension: Secondary | ICD-10-CM

## 2022-10-11 DIAGNOSIS — E782 Mixed hyperlipidemia: Secondary | ICD-10-CM | POA: Diagnosis not present

## 2022-10-11 DIAGNOSIS — R7309 Other abnormal glucose: Secondary | ICD-10-CM | POA: Insufficient documentation

## 2022-10-11 DIAGNOSIS — E6609 Other obesity due to excess calories: Secondary | ICD-10-CM

## 2022-10-11 DIAGNOSIS — Z6839 Body mass index (BMI) 39.0-39.9, adult: Secondary | ICD-10-CM

## 2022-10-11 LAB — POCT URINALYSIS DIP (CLINITEK)
Bilirubin, UA: NEGATIVE
Blood, UA: NEGATIVE
Glucose, UA: NEGATIVE mg/dL
Ketones, POC UA: NEGATIVE mg/dL
Nitrite, UA: NEGATIVE
Spec Grav, UA: 1.025 (ref 1.010–1.025)
Urobilinogen, UA: 0.2 U/dL
pH, UA: 6 (ref 5.0–8.0)

## 2022-10-11 MED ORDER — DOXYCYCLINE HYCLATE 50 MG PO CAPS
50.0000 mg | ORAL_CAPSULE | Freq: Two times a day (BID) | ORAL | 0 refills | Status: DC
Start: 2022-10-11 — End: 2023-10-22

## 2022-10-11 MED ORDER — TRIAMCINOLONE ACETONIDE 40 MG/ML IJ SUSP
40.0000 mg | Freq: Once | INTRAMUSCULAR | Status: AC
Start: 2022-10-11 — End: 2022-10-11
  Administered 2022-10-11: 40 mg via INTRAMUSCULAR

## 2022-10-11 NOTE — Progress Notes (Unsigned)
Madelaine Bhat, CMA,acting as a Neurosurgeon for Arnette Felts, FNP.,have documented all relevant documentation on the behalf of Arnette Felts, FNP,as directed by  Arnette Felts, FNP while in the presence of Arnette Felts, FNP.  Subjective:    Patient ID: Linda Allen , female    DOB: December 17, 1945 , 77 y.o.   MRN: [id number]  Chief Complaint  Patient presents with   Annual Exam    HPI  Patient presents today for HM, Patient reports compliance with medication. Patient denies any chest pain, SOB, or headaches. Patient reports she has had a lot of sinus mucus since her last visit. Patient declines her EKG because she sees her Cardiologist in November.   BP Readings from Last 3 Encounters: 10/11/22 : 130/80 08/15/22 : 120/80 02/07/22 : (!) 140/88  Wt Readings from Last 3 Encounters: 10/11/22 : 262 lb 9.6 oz (119.1 kg) 08/15/22 : 271 lb (122.9 kg) 02/07/22 : 269 lb (122 kg)         Past Medical History:  Diagnosis Date   Allergy    Angio-edema    Arthritis    L hip   Atypical mole 01/21/2014   SEBACECIOUS ATYPIA RIGHT SCALP = MOHS   Heart murmur    Hypertension    Pneumonia    Scleroderma (HCC)    Urticaria      Family History  Problem Relation Age of Onset   CVA Mother    Parkinson's disease Father    CVA Father    Heart attack Father    Eczema Sister    Ulcerative colitis Brother    Heart failure Paternal Grandmother      Current Outpatient Medications:    amLODipine (NORVASC) 5 MG tablet, TAKE 1 TABLET BY MOUTH ONCE DAILY, Disp: 90 tablet, Rfl: 1   doxycycline (VIBRAMYCIN) 50 MG capsule, Take 1 capsule (50 mg total) by mouth 2 (two) times daily., Disp: 14 capsule, Rfl: 0   EPINEPHrine 0.3 mg/0.3 mL IJ SOAJ injection, Inject 0.3 mg into the muscle as needed for anaphylaxis., Disp: 2 each, Rfl: 2   lisinopril (ZESTRIL) 20 MG tablet, TAKE 1 TABLET(20 MG) BY MOUTH DAILY, Disp: 90 tablet, Rfl: 1   hydrOXYzine (ATARAX) 10 MG tablet, Take 1 tablet (10 mg total) by  mouth 3 (three) times daily as needed for itching. (Patient not taking: Reported on 10/11/2022), Disp: 60 tablet, Rfl: 2   mometasone (ELOCON) 0.1 % ointment, APPLY TWICE DAILY AS NEEDED. DO NOT USE ON THE FACE, NECK, ARMPITS OR GROIN AREA. DO NOT USE MORE THAN 3 WEEKS IN A ROW. STOP WHEN CLEAR. (Patient not taking: Reported on 08/15/2022), Disp: 180 g, Rfl: 0   Allergies  Allergen Reactions   Bee Venom Shortness Of Breath   Ivp Dye [Iodinated Contrast Media] Shortness Of Breath and Rash   Other Rash    Contrast dye   Penicillins Hives and Rash    Tolerated Cephalosporin Date: 07/09/19.        The patient states she is post menopausal status.   Negative for: breast discharge, breast lump(s), breast pain and breast self exam. Associated symptoms include abnormal vaginal bleeding. Pertinent negatives include abnormal bleeding (hematology), anxiety, decreased libido, depression, difficulty falling sleep, dyspareunia, history of infertility, nocturia, sexual dysfunction, sleep disturbances, urinary incontinence, urinary urgency, vaginal discharge and vaginal itching. Diet regular. No red meat but eats chicken and vegetables. The patient states her exercise level is minimal with walking, she has been taking her stairs more and now that  it is cooler she will be doing more walking.   The patient's tobacco use is:  Social History   Tobacco Use  Smoking Status Former   Current packs/day: 0.00   Types: Cigarettes   Start date: 01/01/1971   Quit date: 12/31/1980   Years since quitting: 41.8  Smokeless Tobacco Never  Tobacco Comments   smoked for 10 yeas, d/c 40 y ago  . She has been exposed to passive smoke. The patient's alcohol use is:  Social History   Substance and Sexual Activity  Alcohol Use Yes   Comment: occas.  . Additional information: Last pap ***, next one scheduled for ***.    Review of Systems  Constitutional: Negative.   HENT: Negative.    Eyes: Negative.   Respiratory:  Negative.    Cardiovascular: Negative.   Gastrointestinal: Negative.   Endocrine: Negative.   Genitourinary: Negative.   Musculoskeletal: Negative.   Skin: Negative.   Allergic/Immunologic: Negative.   Neurological: Negative.   Hematological: Negative.   Psychiatric/Behavioral: Negative.       Today's Vitals   10/11/22 1043 10/11/22 1131  BP: 130/80 130/78  Pulse: 73   Temp: 97.8 F (36.6 C)   TempSrc: Oral   Weight: 262 lb 9.6 oz (119.1 kg)   Height: 5\' 8"  (1.727 m)    Body mass index is 39.93 kg/m.  Wt Readings from Last 3 Encounters:  10/11/22 262 lb 9.6 oz (119.1 kg)  08/15/22 271 lb (122.9 kg)  02/07/22 269 lb (122 kg)     Objective:  Physical Exam Vitals reviewed.  Constitutional:      General: She is not in acute distress.    Appearance: Normal appearance. She is well-developed. She is obese.  HENT:     Head: Normocephalic and atraumatic.     Right Ear: Hearing, tympanic membrane, ear canal and external ear normal. There is no impacted cerumen.     Left Ear: Hearing, tympanic membrane, ear canal and external ear normal. There is no impacted cerumen.     Nose: Nose normal.     Mouth/Throat:     Mouth: Mucous membranes are moist.  Eyes:     General: Lids are normal.     Extraocular Movements: Extraocular movements intact.     Conjunctiva/sclera: Conjunctivae normal.     Pupils: Pupils are equal, round, and reactive to light.     Funduscopic exam:    Right eye: No papilledema.        Left eye: No papilledema.  Neck:     Thyroid: No thyroid mass.     Vascular: No carotid bruit.  Cardiovascular:     Rate and Rhythm: Normal rate and regular rhythm.     Pulses: Normal pulses.     Heart sounds: Murmur (3/6) heard.  Pulmonary:     Effort: Pulmonary effort is normal. No respiratory distress.     Breath sounds: Normal breath sounds. No wheezing.  Chest:     Chest wall: No mass.  Breasts:    Tanner Score is 5.     Right: Normal. No mass or tenderness.      Left: Normal. No mass or tenderness.  Abdominal:     General: Abdomen is flat. Bowel sounds are normal. There is no distension.     Palpations: Abdomen is soft.     Tenderness: There is no abdominal tenderness.  Musculoskeletal:        General: No swelling or tenderness. Normal range of motion.  Cervical back: Full passive range of motion without pain, normal range of motion and neck supple.     Right lower leg: No edema.     Left lower leg: No edema.  Lymphadenopathy:     Upper Body:     Right upper body: No supraclavicular, axillary or pectoral adenopathy.     Left upper body: No supraclavicular, axillary or pectoral adenopathy.  Skin:    General: Skin is warm and dry.     Capillary Refill: Capillary refill takes less than 2 seconds.  Neurological:     General: No focal deficit present.     Mental Status: She is alert and oriented to person, place, and time.     Cranial Nerves: No cranial nerve deficit.     Sensory: No sensory deficit.     Motor: No weakness.  Psychiatric:        Mood and Affect: Mood normal.        Behavior: Behavior normal.        Thought Content: Thought content normal.        Judgment: Judgment normal.         Assessment And Plan:     Encounter for annual health examination  Class 2 obesity due to excess calories with body mass index (BMI) of 39.0 to 39.9 in adult, unspecified whether serious comorbidity present  Essential hypertension -     POCT URINALYSIS DIP (CLINITEK) -     Microalbumin / creatinine urine ratio  Mixed hyperlipidemia  Abnormal glucose -     Hemoglobin A1c  Acute non-recurrent frontal sinusitis -     Doxycycline Hyclate; Take 1 capsule (50 mg total) by mouth 2 (two) times daily.  Dispense: 14 capsule; Refill: 0 -     Triamcinolone Acetonide  Other long term (current) drug therapy -     CBC with Differential/Platelet     Return for 1 year physical, 6 month bp check. Patient was given opportunity to ask questions.  Patient verbalized understanding of the plan and was able to repeat key elements of the plan. All questions were answered to their satisfaction.   Arnette Felts, FNP  I, Arnette Felts, FNP, have reviewed all documentation for this visit. The documentation on 10/26/22 for the exam, diagnosis, procedures, and orders are all accurate and complete.

## 2022-10-12 LAB — CBC WITH DIFFERENTIAL/PLATELET
Basophils Absolute: 0.1 10*3/uL (ref 0.0–0.2)
Basos: 1 %
EOS (ABSOLUTE): 0.3 10*3/uL (ref 0.0–0.4)
Eos: 6 %
Hematocrit: 38.7 % (ref 34.0–46.6)
Hemoglobin: 13 g/dL (ref 11.1–15.9)
Immature Grans (Abs): 0 10*3/uL (ref 0.0–0.1)
Immature Granulocytes: 0 %
Lymphocytes Absolute: 1.8 10*3/uL (ref 0.7–3.1)
Lymphs: 33 %
MCH: 31.9 pg (ref 26.6–33.0)
MCHC: 33.6 g/dL (ref 31.5–35.7)
MCV: 95 fL (ref 79–97)
Monocytes Absolute: 0.4 10*3/uL (ref 0.1–0.9)
Monocytes: 7 %
Neutrophils Absolute: 3 10*3/uL (ref 1.4–7.0)
Neutrophils: 53 %
Platelets: 345 10*3/uL (ref 150–450)
RBC: 4.08 x10E6/uL (ref 3.77–5.28)
RDW: 14 % (ref 11.7–15.4)
WBC: 5.6 10*3/uL (ref 3.4–10.8)

## 2022-10-12 LAB — HEMOGLOBIN A1C
Est. average glucose Bld gHb Est-mCnc: 114 mg/dL
Hgb A1c MFr Bld: 5.6 % (ref 4.8–5.6)

## 2022-10-12 LAB — MICROALBUMIN / CREATININE URINE RATIO
Creatinine, Urine: 170.1 mg/dL
Microalb/Creat Ratio: 36 mg/g{creat} — ABNORMAL HIGH (ref 0–29)
Microalbumin, Urine: 61.2 ug/mL

## 2022-10-26 NOTE — Assessment & Plan Note (Signed)
Blood pressure is fairly controlled, continue focusing on lifestyle modifications

## 2022-10-26 NOTE — Assessment & Plan Note (Signed)
Cholesterol levels are stable

## 2022-10-27 NOTE — Assessment & Plan Note (Signed)
Behavior modifications discussed and diet history reviewed.   Pt will continue to exercise regularly and modify diet with low GI, plant based foods and decrease intake of processed foods.  Recommend intake of daily multivitamin, Vitamin D, and calcium.  Recommend mammogram and colonoscopy for preventive screenings, as well as recommend immunizations that include influenza, TDAP, and Shingles  

## 2022-10-27 NOTE — Assessment & Plan Note (Signed)
HgbA1c has been stable. Continue focusing on healthy diet low in sugar and starches

## 2022-10-27 NOTE — Assessment & Plan Note (Signed)
Has frontal sinus pressure and congestion. Will treat with Augmentin.

## 2022-10-27 NOTE — Assessment & Plan Note (Signed)
She is encouraged to strive for BMI less than 30 to decrease cardiac risk. Advised to aim for at least 150 minutes of exercise per week.

## 2022-11-11 ENCOUNTER — Ambulatory Visit: Payer: Medicare PPO | Admitting: Nurse Practitioner

## 2022-11-11 ENCOUNTER — Encounter: Payer: Self-pay | Admitting: Nurse Practitioner

## 2022-11-11 ENCOUNTER — Ambulatory Visit: Payer: Medicare PPO | Attending: Nurse Practitioner | Admitting: Nurse Practitioner

## 2022-11-11 VITALS — BP 128/74 | HR 70 | Ht 68.0 in | Wt 265.0 lb

## 2022-11-11 DIAGNOSIS — I10 Essential (primary) hypertension: Secondary | ICD-10-CM

## 2022-11-11 DIAGNOSIS — E782 Mixed hyperlipidemia: Secondary | ICD-10-CM

## 2022-11-11 DIAGNOSIS — I350 Nonrheumatic aortic (valve) stenosis: Secondary | ICD-10-CM | POA: Diagnosis not present

## 2022-11-11 NOTE — Patient Instructions (Addendum)
Medication Instructions:  Your physician recommends that you continue on your current medications as directed. Please refer to the Current Medication list given to you today.  *If you need a refill on your cardiac medications before your next appointment, please call your pharmacy*   Lab Work: NONE ordered at this time of appointment      Testing/Procedures: Your physician has requested that you have an echocardiogram. Echocardiography is a painless test that uses sound waves to create images of your heart. It provides your doctor with information about the size and shape of your heart and how well your heart's chambers and valves are working. This procedure takes approximately one hour. There are no restrictions for this procedure. Please do NOT wear cologne, perfume, aftershave, or lotions (deodorant is allowed). Please arrive 15 minutes prior to your appointment time.  Please note: We ask at that you not bring children with you during ultrasound (echo/ vascular) testing. Due to room size and safety concerns, children are not allowed in the ultrasound rooms during exams. Our front office staff cannot provide observation of children in our lobby area while testing is being conducted. An adult accompanying a patient to their appointment will only be allowed in the ultrasound room at the discretion of the ultrasound technician under special circumstances. We apologize for any inconvenience.   Calcium Scoring Test    Follow-Up: At [facility], you and your health needs are our priority.  As part of our continuing mission to provide you with exceptional heart care, we have created designated Provider Care Teams.  These Care Teams include your primary Cardiologist (physician) and Advanced Practice Providers (APPs -  Physician Assistants and Nurse Practitioners) who all work together to provide you with the care you need, when you need it.  We recommend signing up for the patient  portal called "MyChart".  Sign up information is provided on this After Visit Summary.  MyChart is used to connect with patients for Virtual Visits (Telemedicine).  Patients are able to view lab/test results, encounter notes, upcoming appointments, etc.  Non-urgent messages can be sent to your provider as well.   To learn more about what you can do with MyChart, go to [email or web address].    Your next appointment:   6 month(s)  Provider:   Bernadene Person, NP        Other Instructions Coronary Calcium Scan A coronary calcium scan is an imaging test used to look for deposits of plaque in the inner lining of the blood vessels of the heart (coronary arteries). Plaque is made up of calcium, protein, and fatty substances. These deposits of plaque can partly clog and narrow the coronary arteries without producing any symptoms or warning signs. This puts a person at risk for a heart attack. A coronary calcium scan is performed using a computed tomography (CT) scanner machine without using a dye (contrast). This test is recommended for people who are at moderate risk for heart disease. The test can find plaque deposits before symptoms develop. Tell a health care provider about: Any allergies you have. All medicines you are taking, including vitamins, herbs, eye drops, creams, and over-the-counter medicines. Any problems you or family members have had with anesthetic medicines. Any bleeding problems you have. Any surgeries you have had. Any medical conditions you have. Whether you are pregnant or may be pregnant. What are the risks? Generally, this is a safe procedure. However, problems may occur, including: Harm to a pregnant woman and her unborn baby. This  test involves the use of radiation. Radiation exposure can be dangerous to a pregnant woman and her unborn baby. If you are pregnant or think you may be pregnant, you should not have this procedure done. A slight increase in the risk of cancer.  This is because of the radiation involved in the test. The amount of radiation from one test is similar to the amount of radiation you are naturally exposed to over one year. What happens before the procedure? Ask your health care provider for any specific instructions on how to prepare for this procedure. You may be asked to avoid products that contain caffeine, tobacco, or nicotine for 4 hours before the procedure. What happens during the procedure?  You will undress and remove any jewelry from your neck or chest. You may need to remove hearing aides and dentures. Women may need to remove their bras. You will put on a hospital gown. Sticky electrodes will be placed on your chest. The electrodes will be connected to an electrocardiogram (ECG) machine to record a tracing of the electrical activity of your heart. You will lie down on your back on a curved bed that is attached to the CT scanner. You may be given medicine to slow down your heart rate so that clear pictures can be created. You will be moved into the CT scanner, and the CT scanner will take pictures of your heart. During this time, you will be asked to lie still and hold your breath for 10-20 seconds at a time while each picture of your heart is being taken. The procedure may vary among health care providers and hospitals. What can I expect after the procedure? You can return to your normal activities. It is up to you to get the results of your procedure. Ask your health care provider, or the department that is doing the procedure, when your results will be ready. Summary A coronary calcium scan is an imaging test used to look for deposits of plaque in the inner lining of the blood vessels of the heart. Plaque is made up of calcium, protein, and fatty substances. A coronary calcium scan is performed using a CT scanner machine without contrast. Generally, this is a safe procedure. Tell your health care provider if you are pregnant or may  be pregnant. Ask your health care provider for any specific instructions on how to prepare for this procedure. You can return to your normal activities after the scan is done. This information is not intended to replace advice given to you by your health care provider. Make sure you discuss any questions you have with your health care provider. Document Revised: 12/06/2020 Document Reviewed: 12/06/2020 Elsevier Patient Education  2024 [employer].

## 2022-11-11 NOTE — Progress Notes (Signed)
Office Visit    Patient Name: Linda Allen Date of Encounter: 11/11/2022  Primary Care Provider:  Arnette Felts, FNP Primary Cardiologist:  Reatha Harps, MD  Chief Complaint    77 year old female with a history of aortic valve stenosis, hypertension, hyperlipidemia, osteoarthritis, and scleroderma who presents for follow-up related to aortic stenosis.  Past Medical History    Past Medical History:  Diagnosis Date   Allergy    Angio-edema    Arthritis    L hip   Atypical mole 01/21/2014   SEBACECIOUS ATYPIA RIGHT SCALP = MOHS   Heart murmur    Hypertension    Pneumonia    Scleroderma (HCC)    Urticaria    Past Surgical History:  Procedure Laterality Date   ADENOIDECTOMY     ENDOVENOUS ABLATION SAPHENOUS VEIN W/ LASER Left 09/26/2018   endovenous laser ablation left greater saphenous vein by Linda Bruns MD    ENDOVENOUS ABLATION SAPHENOUS VEIN W/ LASER Right 10/17/2018   endovenous laser ablation right greater saphenous vein by Linda Bruns MD    EYE SURGERY     JOINT REPLACEMENT  2011, 2012   both knees   REVERSE SHOULDER ARTHROPLASTY Left 09/20/2019   Procedure: REVERSE SHOULDER ARTHROPLASTY;  Surgeon: Linda Low, MD;  Location: [facility];  Service: Orthopedics;  Laterality: Left;   REVERSE SHOULDER ARTHROPLASTY Right 12/15/2021   Procedure: Right REVERSE SHOULDER ARTHROPLASTY;  Surgeon: Linda Low, MD;  Location: [facility];  Service: Orthopedics;  Laterality: Right;  120 min choice and interscalene block   SHOULDER ACROMIOPLASTY Right 2012   TONSILLECTOMY     TOTAL HIP ARTHROPLASTY Left 07/09/2019   Procedure: TOTAL HIP ARTHROPLASTY ANTERIOR APPROACH;  Surgeon: Linda Romans, MD;  Location: [facility];  Service: Orthopedics;  Laterality: Left;  70 mins    Allergies  Allergies  Allergen Reactions   Bee Venom Shortness Of Breath   Ivp Dye [Iodinated Contrast Media] Shortness Of Breath and Rash   Other Rash    Contrast dye   Penicillins Hives and  Rash    Tolerated Cephalosporin Date: 07/09/19.       Labs/Other Studies Reviewed    The following studies were reviewed today:  Cardiac Studies & Procedures       ECHOCARDIOGRAM  ECHOCARDIOGRAM COMPLETE 11/03/2021  Narrative ECHOCARDIOGRAM REPORT    Patient Name:   Linda Allen Date of Exam: 11/03/2021 Medical Rec #:  [id number]           Height:       68.0 in Accession #:    [id number]          Weight:       263.0 lb Date of Birth:  1945-11-29           BSA:          2.296 m Patient Age:    76 years            BP:           134/66 mmHg Patient Gender: F                   HR:           82 bpm. Exam Location:  [address]  Procedure: 2D Echo, Cardiac Doppler and Color Doppler  Indications:    I35.0 Nonrheumatic aortic (valve) stenosis  History:        Patient has prior history of Echocardiogram examinations, most recent 10/01/2020. Signs/Symptoms:Murmur; Risk Factors:Hypertension. Morbid obesity.  Sonographer:    Linda Allen Referring Phys: [id number] Linda Allen C Karelly Dewalt  IMPRESSIONS   1. Left ventricular ejection fraction, by estimation, is 70 to 75%. The left ventricle has hyperdynamic function. The left ventricle has no regional wall motion abnormalities. There is mild concentric left ventricular hypertrophy. Left ventricular diastolic parameters are consistent with Grade I diastolic dysfunction (impaired relaxation). 2. Right ventricular systolic function is hyperdynamic. The right ventricular size is normal. There is normal pulmonary artery systolic pressure. 3. The mitral valve is grossly normal. No evidence of mitral valve regurgitation. No evidence of mitral stenosis. Severe mitral annular calcification. 4. The aortic valve is calcified. Aortic valve regurgitation is mild. Moderate to severe aortic valve stenosis. Aortic valve area, by VTI measures 0.98 cm. Aortic valve mean gradient measures 31.0 mmHg. DVI 0.31.  Comparison(s): Prior images reviewed side  by side. Similar aortic valve parameters from prior, LV function is more vigorous.  FINDINGS Left Ventricle: Left ventricular ejection fraction, by estimation, is 70 to 75%. The left ventricle has hyperdynamic function. The left ventricle has no regional wall motion abnormalities. The left ventricular internal cavity size was normal in size. There is mild concentric left ventricular hypertrophy. Left ventricular diastolic parameters are consistent with Grade I diastolic dysfunction (impaired relaxation).  Right Ventricle: The right ventricular size is normal. No increase in right ventricular wall thickness. Right ventricular systolic function is hyperdynamic. There is normal pulmonary artery systolic pressure. The tricuspid regurgitant velocity is 2.21 m/s, and with an assumed right atrial pressure of 3 mmHg, the estimated right ventricular systolic pressure is 22.5 mmHg.  Left Atrium: Left atrial size was normal in size.  Right Atrium: Right atrial size was normal in size.  Pericardium: There is no evidence of pericardial effusion.  Mitral Valve: The mitral valve is grossly normal. Severe mitral annular calcification. No evidence of mitral valve regurgitation. No evidence of mitral valve stenosis.  Tricuspid Valve: The tricuspid valve is normal in structure. Tricuspid valve regurgitation is trivial. No evidence of tricuspid stenosis.  Aortic Valve: The aortic valve is calcified. Aortic valve regurgitation is mild. Moderate to severe aortic stenosis is present. Aortic valve mean gradient measures 31.0 mmHg. Aortic valve peak gradient measures 53.3 mmHg. Aortic valve area, by VTI measures 0.98 cm.  Pulmonic Valve: The pulmonic valve was normal in structure. Pulmonic valve regurgitation is not visualized. No evidence of pulmonic stenosis.  Aorta: The aortic root and ascending aorta are structurally normal, with no evidence of dilitation.  IAS/Shunts: No atrial level shunt detected by color  flow Doppler.   LEFT VENTRICLE PLAX 2D LVIDd:         4.60 cm   Diastology LVIDs:         2.70 cm   LV e' medial:    9.79 cm/s LV PW:         1.10 cm   LV E/e' medial:  11.5 LV IVS:        1.10 cm   LV e' lateral:   10.00 cm/s LVOT diam:     2.00 cm   LV E/e' lateral: 11.3 LV SV:         101 LV SV Index:   44 LVOT Area:     3.14 cm   RIGHT VENTRICLE RV Basal diam:  3.20 cm RV S prime:     9.79 cm/s TAPSE (M-mode): 2.9 cm RVSP:           22.5 mmHg  LEFT ATRIUM  Index        RIGHT ATRIUM           Index LA diam:        4.40 cm 1.92 cm/m   RA Pressure: 3.00 mmHg LA Vol (A2C):   69.9 ml 30.44 ml/m  RA Area:     13.40 cm LA Vol (A4C):   58.2 ml 25.35 ml/m  RA Volume:   29.90 ml  13.02 ml/m LA Biplane Vol: 65.5 ml 28.53 ml/m AORTIC VALVE AV Area (Vmax):    0.90 cm AV Area (Vmean):   0.82 cm AV Area (VTI):     0.98 cm AV Vmax:           365.00 cm/s AV Vmean:          264.000 cm/s AV VTI:            1.030 m AV Peak Grad:      53.3 mmHg AV Mean Grad:      31.0 mmHg LVOT Vmax:         104.00 cm/s LVOT Vmean:        68.800 cm/s LVOT VTI:          0.320 m LVOT/AV VTI ratio: 0.31  AORTA Ao Root diam: 3.30 cm Ao Asc diam:  3.60 cm  MITRAL VALVE                TRICUSPID VALVE MV Area (PHT): 3.87 cm     TR Peak grad:   19.5 mmHg MV Decel Time: 196 msec     TR Vmax:        221.00 cm/s MV E velocity: 113.00 cm/s  Estimated RAP:  3.00 mmHg MV A velocity: 146.00 cm/s  RVSP:           22.5 mmHg MV E/A ratio:  0.77 SHUNTS Systemic VTI:  0.32 m Systemic Diam: 2.00 cm  Riley Lam MD Electronically signed by Riley Lam MD Signature Date/Time: 11/03/2021/11:50:02 AM    Final            Recent Labs: 08/15/2022: ALT 12; BUN 27; Creatinine, Ser 1.04; Potassium 5.3; Sodium 136 10/11/2022: Hemoglobin 13.0; Platelets 345  Recent Lipid Panel    Component Value Date/Time   CHOL 243 (H) 08/15/2022 1602   TRIG 108 08/15/2022 1602   HDL 99  08/15/2022 1602   CHOLHDL 2.5 08/15/2022 1602   CHOLHDL 3.2 10/15/2012 1341   VLDL 19 10/15/2012 1341   LDLCALC 126 (H) 08/15/2022 1602    History of Present Illness    77 year old female with the above past medical history of aortic valve stenosis, hypertension, hyperlipidemia, osteoarthritis, and scleroderma.   She has a history of moderate aortic stenosis. She was last seen in the office on 10/19/2021 and was stable from a cardiac standpoint.  She was cleared for shoulder surgery at the time. Most recent echocardiogram in 10/2021 showed EF 70 to 75%, hyperdynamic LV function, no RWMA, mild concentric LVH, G1 DD, normal RV, severe MAC, mild aortic valve regurgitation, moderate to severe aortic valve stenosis, mean gradient 31 mmHg, no significant change from prior study.   She presents today for follow-up . Since her last visit has done well from a cardiac standpoint.  She denies any symptoms concerning for angina, denies dyspnea, edema, PND, orthopnea, weight gain, palpitations, dizziness, presyncope, or syncope.  She is recovering from an upper respiratory infection.  Otherwise, she reports feeling well.    Home Medications    Current Outpatient  Medications  Medication Sig Dispense Refill   amLODipine (NORVASC) 5 MG tablet TAKE 1 TABLET BY MOUTH ONCE DAILY 90 tablet 1   doxycycline (VIBRAMYCIN) 50 MG capsule Take 1 capsule (50 mg total) by mouth 2 (two) times daily. 14 capsule 0   EPINEPHrine 0.3 mg/0.3 mL IJ SOAJ injection Inject 0.3 mg into the muscle as needed for anaphylaxis. 2 each 2   hydrOXYzine (ATARAX) 10 MG tablet Take 1 tablet (10 mg total) by mouth 3 (three) times daily as needed for itching. 60 tablet 2   lisinopril (ZESTRIL) 20 MG tablet TAKE 1 TABLET(20 MG) BY MOUTH DAILY 90 tablet 1   mometasone (ELOCON) 0.1 % ointment APPLY TWICE DAILY AS NEEDED. DO NOT USE ON THE FACE, NECK, ARMPITS OR GROIN AREA. DO NOT USE MORE THAN 3 WEEKS IN A ROW. STOP WHEN CLEAR. 180 g 0   No  current facility-administered medications for this visit.     Review of Systems    She denies chest pain, palpitations, dyspnea, pnd, orthopnea, n, v, dizziness, syncope, edema, weight gain, or early satiety. All other systems reviewed and are otherwise negative except as noted above.   Physical Exam    VS:  BP 128/74 (BP Location: Right Arm, Patient Position: Sitting, Cuff Size: Large)   Pulse 70   Ht 5\' 8"  (1.727 m)   Wt 265 lb (120.2 kg)   SpO2 90%   BMI 40.29 kg/m  GEN: Well nourished, well developed, in no acute distress. HEENT: normal. Neck: Supple, no JVD, carotid bruits, or masses. Cardiac: RRR, 3/6 murmur, no rubs, or gallops. No clubbing, cyanosis, edema.  Radials/DP/PT 2+ and equal bilaterally.  Respiratory:  Respirations regular and unlabored, clear to auscultation bilaterally. GI: Soft, nontender, nondistended, BS + x 4. MS: no deformity or atrophy. Skin: warm and dry, no rash. Neuro:  Strength and sensation are intact. Psych: Normal affect.  Accessory Clinical Findings    ECG personally reviewed by me today - EKG Interpretation Date/Time:  Friday November 11 2022 07:59:47 EDT Ventricular Rate:  70 PR Interval:  138 QRS Duration:  84 QT Interval:  406 QTC Calculation: 438 R Axis:   -16  Text Interpretation: Sinus rhythm with Premature supraventricular complexes Possible Left atrial enlargement Confirmed by Bernadene Person ([id number]) on 11/11/2022 8:12:05 AM  - no acute changes.   Lab Results  Component Value Date   WBC 5.6 10/11/2022   HGB 13.0 10/11/2022   HCT 38.7 10/11/2022   MCV 95 10/11/2022   PLT 345 10/11/2022   Lab Results  Component Value Date   CREATININE 1.04 (H) 08/15/2022   BUN 27 08/15/2022   NA 136 08/15/2022   K 5.3 (H) 08/15/2022   CL 101 08/15/2022   CO2 23 08/15/2022   Lab Results  Component Value Date   ALT 12 08/15/2022   AST 15 08/15/2022   ALKPHOS 65 08/15/2022   BILITOT 0.4 08/15/2022   Lab Results  Component Value Date    CHOL 243 (H) 08/15/2022   HDL 99 08/15/2022   LDLCALC 126 (H) 08/15/2022   TRIG 108 08/15/2022   CHOLHDL 2.5 08/15/2022    Lab Results  Component Value Date   HGBA1C 5.6 10/11/2022    Assessment & Plan    1. Aortic stenosis: Most recent echocardiogram in 10/2021 showed EF 70 to 75%, hyperdynamic LV function, no RWMA, mild concentric LVH, G1 DD, normal RV, severe MAC, mild aortic valve regurgitation, moderate to severe aortic valve stenosis, mean gradient 31  mmHg, no significant change from prior study.  Asymptomatic.  Euvolemic and well compensated on exam. Will repeat echo for routine monitoring.    2. Hypertension: BP well controlled. Continue current antihypertensive regimen.    3. Hyperlipidemia: LDL was 126 in 08/2022.  Stable with no anginal symptoms.  However, she had discussed checking a coronary calcium score with her PCP and is interested in pursuing this.  Will order for further risk stratification.  If calcium score is elevated, consider initiation of statin therapy.  Monitored per PCP.   5. Disposition: Follow-up in 6 months.       Joylene Grapes, NP 11/11/2022, 9:50 AM

## 2022-12-01 ENCOUNTER — Ambulatory Visit: Payer: Medicare PPO

## 2022-12-01 ENCOUNTER — Ambulatory Visit: Payer: Self-pay | Admitting: Nurse Practitioner

## 2022-12-01 DIAGNOSIS — Z0000 Encounter for general adult medical examination without abnormal findings: Secondary | ICD-10-CM

## 2022-12-01 NOTE — Patient Instructions (Signed)
Ms. Laspisa , Thank you for taking time to come for your Medicare Wellness Visit. I appreciate your ongoing commitment to your health goals. Please review the following plan we discussed and let me know if I can assist you in the future.   Referrals/Orders/Follow-Ups/Clinician Recommendations: none  This is a list of the screening recommended for you and due dates:  Health Maintenance  Topic Date Due   Flu Shot  08/11/2022   COVID-19 Vaccine (6 - 2023-24 season) 09/11/2022   Mammogram  11/16/2022   Medicare Annual Wellness Visit  12/01/2023   Colon Cancer Screening  12/26/2024   DTaP/Tdap/Td vaccine (3 - Td or Tdap) 06/13/2031   Pneumonia Vaccine  Completed   DEXA scan (bone density measurement)  Completed   Hepatitis C Screening  Completed   Zoster (Shingles) Vaccine  Completed   HPV Vaccine  Aged Out    Advanced directives: (In Chart) A copy of your advanced directives are scanned into your chart should your provider ever need it.  Next Medicare Annual Wellness Visit scheduled for next year: No, office will schedule appointment  Insert Preventive Care attachment Insert FALL PREVENTION attachment if needed

## 2022-12-01 NOTE — Progress Notes (Signed)
Subjective:   Linda Allen is a 77 y.o. female who presents for Medicare Annual (Subsequent) preventive examination.  Visit Complete: Virtual I connected with  Linda Allen on 12/01/22 by a audio enabled telemedicine application and verified that I am speaking with the correct person using two identifiers.  Patient Location: Home  Provider Location: Office/Clinic  I discussed the limitations of evaluation and management by telemedicine. The patient expressed understanding and agreed to proceed.  Vital Signs: Because this visit was a virtual/telehealth visit, some criteria may be missing or patient reported. Any vitals not documented were not able to be obtained and vitals that have been documented are patient reported.    Cardiac Risk Factors include: advanced age (>35men, >33 women);hypertension     Objective:    Today's Vitals   There is no height or weight on file to calculate BMI.     12/01/2022    2:39 PM 12/15/2021    4:11 PM 12/06/2021    9:07 AM 11/10/2021    2:47 PM 10/15/2020   12:24 PM 09/18/2019   10:18 AM 07/09/2019    2:57 PM  Advanced Directives  Does Patient Have a Medical Advance Directive? Yes Yes Yes Yes Yes Yes Yes  Type of Estate agent of [place];Living will Healthcare Power of [employer] of [place];Living will Healthcare Power of [place];Living will Healthcare Power of [place];Living will Healthcare Power of [place];Living will Healthcare Power of [place];Living will  Does patient want to make changes to medical advance directive?  No - Patient declined     No - Patient declined  Copy of Healthcare Power of Attorney in Chart? Yes - validated most recent copy scanned in chart (See row information)  Yes - validated most recent copy scanned in chart (See row information) Yes - validated most recent copy scanned in chart (See row information) Yes - validated most recent copy scanned in chart (See row  information)  No - copy requested    Current Medications (verified) Outpatient Encounter Medications as of 12/01/2022  Medication Sig   amLODipine (NORVASC) 5 MG tablet TAKE 1 TABLET BY MOUTH ONCE DAILY   EPINEPHrine 0.3 mg/0.3 mL IJ SOAJ injection Inject 0.3 mg into the muscle as needed for anaphylaxis.   hydrOXYzine (ATARAX) 10 MG tablet Take 1 tablet (10 mg total) by mouth 3 (three) times daily as needed for itching.   lisinopril (ZESTRIL) 20 MG tablet TAKE 1 TABLET(20 MG) BY MOUTH DAILY   mometasone (ELOCON) 0.1 % ointment APPLY TWICE DAILY AS NEEDED. DO NOT USE ON THE FACE, NECK, ARMPITS OR GROIN AREA. DO NOT USE MORE THAN 3 WEEKS IN A ROW. STOP WHEN CLEAR.   doxycycline (VIBRAMYCIN) 50 MG capsule Take 1 capsule (50 mg total) by mouth 2 (two) times daily. (Patient not taking: Reported on 12/01/2022)   [DISCONTINUED] ferrous sulfate (FERROUSUL) 325 (65 FE) MG tablet Take 1 tablet (325 mg total) by mouth 3 (three) times daily with meals for 14 days. (Patient not taking: Reported on 09/12/2019)   No facility-administered encounter medications on file as of 12/01/2022.    Allergies (verified) Bee venom, Ivp dye [iodinated contrast media], Other, and Penicillins   History: Past Medical History:  Diagnosis Date   Allergy    Angio-edema    Arthritis    L hip   Atypical mole 01/21/2014   SEBACECIOUS ATYPIA RIGHT SCALP = MOHS   Heart murmur    Hypertension    Pneumonia    Scleroderma (  HCC)    Urticaria    Past Surgical History:  Procedure Laterality Date   ADENOIDECTOMY     ENDOVENOUS ABLATION SAPHENOUS VEIN W/ LASER Left 09/26/2018   endovenous laser ablation left greater saphenous vein by Fabienne Bruns MD    ENDOVENOUS ABLATION SAPHENOUS VEIN W/ LASER Right 10/17/2018   endovenous laser ablation right greater saphenous vein by Fabienne Bruns MD    EYE SURGERY     JOINT REPLACEMENT  2011, 2012   both knees   REVERSE SHOULDER ARTHROPLASTY Left 09/20/2019   Procedure: REVERSE  SHOULDER ARTHROPLASTY;  Surgeon: Beverely Low, MD;  Location: [facility];  Service: Orthopedics;  Laterality: Left;   REVERSE SHOULDER ARTHROPLASTY Right 12/15/2021   Procedure: Right REVERSE SHOULDER ARTHROPLASTY;  Surgeon: Beverely Low, MD;  Location: [facility];  Service: Orthopedics;  Laterality: Right;  120 min choice and interscalene block   SHOULDER ACROMIOPLASTY Right 2012   TONSILLECTOMY     TOTAL HIP ARTHROPLASTY Left 07/09/2019   Procedure: TOTAL HIP ARTHROPLASTY ANTERIOR APPROACH;  Surgeon: Durene Romans, MD;  Location: [facility];  Service: Orthopedics;  Laterality: Left;  70 mins   Family History  Problem Relation Age of Onset   CVA Mother    Parkinson's disease Father    CVA Father    Heart attack Father    Eczema Sister    Ulcerative colitis Brother    Heart failure Paternal Grandmother    Social History   Socioeconomic History   Marital status: Married    Spouse name: Not on file   Number of children: 2   Years of education: Not on file   Highest education level: Not on file  Occupational History   Occupation: retired  Tobacco Use   Smoking status: Former    Current packs/day: 0.00    Types: Cigarettes    Start date: 01/01/1971    Quit date: 12/31/1980    Years since quitting: 41.9   Smokeless tobacco: Never   Tobacco comments:    smoked for 10 yeas, d/c 40 y ago  Vaping Use   Vaping status: Never Used  Substance and Sexual Activity   Alcohol use: Yes    Comment: occas.   Drug use: Never   Sexual activity: Yes    Birth control/protection: None    Comment: number of sex partners in the last 12 months  1  Other Topics Concern   Not on file  Social History Narrative   Exercise walking 3 times per week for 30 minutes.      Anticipatory guidance - has living will, full code, but would not want to be on ventilator for prolonged time. organ donor.             Social Determinants of Health   Financial Resource Strain: Low Risk  (12/01/2022)   Overall  Financial Resource Strain (CARDIA)    Difficulty of Paying Living Expenses: Not hard at all  Food Insecurity: No Food Insecurity (12/01/2022)   Hunger Vital Sign    Worried About Running Out of Food in the Last Year: Never true    Ran Out of Food in the Last Year: Never true  Transportation Needs: No Transportation Needs (12/01/2022)   PRAPARE - Administrator, Civil Service (Medical): No    Lack of Transportation (Non-Medical): No  Physical Activity: Sufficiently Active (12/01/2022)   Exercise Vital Sign    Days of Exercise per Week: 7 days    Minutes of Exercise per Session: 40  min  Stress: No Stress Concern Present (12/01/2022)   [employer] of Occupational Health - Occupational Stress Questionnaire    Feeling of Stress : Not at all  Social Connections: Moderately Isolated (12/01/2022)   Social Connection and Isolation Panel [NHANES]    Frequency of Communication with Friends and Family: More than three times a week    Frequency of Social Gatherings with Friends and Family: Never    Attends Religious Services: Never    Database administrator or Organizations: No    Attends Engineer, structural: Never    Marital Status: Married    Tobacco Counseling Counseling given: Not Answered Tobacco comments: smoked for 10 yeas, d/c 40 y ago   Clinical Intake:  Pre-visit preparation completed: Yes  Pain : No/denies pain     Nutritional Risks: None Diabetes: No  How often do you need to have someone help you when you read instructions, pamphlets, or other written materials from your doctor or pharmacy?: 1 - Never  Interpreter Needed?: No  Information entered by :: NAllen LPN   Activities of Daily Living    12/01/2022    2:36 PM 12/15/2021    4:11 PM  In your present state of health, do you have any difficulty performing the following activities:  Hearing? 0 0  Vision? 0 0  Difficulty concentrating or making decisions? 0 0  Walking or climbing  stairs? 0 0  Dressing or bathing? 0 0  Doing errands, shopping? 0 0  Preparing Food and eating ? N   Using the Toilet? N   In the past six months, have you accidently leaked urine? N   Do you have problems with loss of bowel control? N   Managing your Medications? N   Managing your Finances? N   Housekeeping or managing your Housekeeping? N     Patient Care Team: Arnette Felts, FNP as PCP - General (General Practice) O'Neal, Ronnald Ramp, MD as PCP - Cardiology (Cardiology) Janalyn Harder, MD (Inactive) as Consulting Physician (Dermatology)  Indicate any recent Medical Services you may have received from other than [facility] providers in the past year (date may be approximate).     Assessment:   This is a routine wellness examination for [place].  Hearing/Vision screen Hearing Screening - Comments:: Denies hearing issues Vision Screening - Comments:: Regular eye exams, [facility]   Goals Addressed             This Visit's Progress    Patient Stated       12/01/2022, wants to lose more weight       Depression Screen    12/01/2022    2:40 PM 10/11/2022   10:43 AM 02/07/2022   11:52 AM 11/10/2021    2:48 PM 10/06/2021   11:25 AM 10/06/2021   11:17 AM 06/17/2021   10:05 AM  PHQ 2/9 Scores  PHQ - 2 Score 0 0 0 0 0 0 0  PHQ- 9 Score 0 0     0    Fall Risk    12/01/2022    2:39 PM 10/11/2022   10:43 AM 02/07/2022   11:52 AM 11/10/2021    2:47 PM 10/06/2021   11:17 AM  Fall Risk   Falls in the past year? 0 0 0 0 0  Number falls in past yr: 0 0 0 0 0  Injury with Fall? 0 0 0 0 0  Risk for fall due to : Medication side effect No Fall Risks No  Fall Risks Medication side effect No Fall Risks  Follow up Falls prevention discussed;Falls evaluation completed Falls evaluation completed Falls evaluation completed Falls prevention discussed;Education provided;Falls evaluation completed Falls evaluation completed    MEDICARE RISK AT HOME: Medicare Risk at Home Any stairs in  or around the home?: Yes If so, are there any without handrails?: No Home free of loose throw rugs in walkways, pet beds, electrical cords, etc?: Yes Adequate lighting in your home to reduce risk of falls?: Yes Life alert?: No Use of a cane, walker or w/c?: No Grab bars in the bathroom?: Yes Shower chair or bench in shower?: No Elevated toilet seat or a handicapped toilet?: Yes  TIMED UP AND GO:  Was the test performed?  No    Cognitive Function:        12/01/2022    2:40 PM 11/10/2021    2:48 PM 10/15/2020   12:26 PM 06/13/2019    3:25 PM  6CIT Screen  What Year? 0 points 0 points 0 points 0 points  What month? 0 points 0 points 0 points 0 points  What time? 0 points 0 points 0 points 0 points  Count back from 20 0 points 0 points 0 points 0 points  Months in reverse 0 points 0 points 0 points 0 points  Repeat phrase 0 points 0 points 0 points 0 points  Total Score 0 points 0 points 0 points 0 points    Immunizations Immunization History  Administered Date(s) Administered   Fluad Quad(high Dose 65+) 02/07/2022   Influenza, High Dose Seasonal PF 10/14/2020   Influenza,inj,Quad PF,6+ Mos 10/15/2012   Influenza-Unspecified 11/20/2015, 10/11/2019   PFIZER(Purple Top)SARS-COV-2 Vaccination 03/06/2019, 04/03/2019, 10/11/2019   PNEUMOCOCCAL CONJUGATE-20 02/07/2022   Pfizer Covid-19 Vaccine Bivalent Booster 61yrs & up 10/13/2020   Pneumococcal-Unspecified 10/10/2012   Tdap 08/10/2009, 06/12/2021   Unspecified SARS-COV-2 Vaccination 10/20/2021   Zoster Recombinant(Shingrix) 02/16/2022, 06/15/2022    TDAP status: Up to date  Flu Vaccine status: Due, Education has been provided regarding the importance of this vaccine. Advised may receive this vaccine at local pharmacy or Health Dept. Aware to provide a copy of the vaccination record if obtained from local pharmacy or Health Dept. Verbalized acceptance and understanding.  Pneumococcal vaccine status: Up to date  Covid-19  vaccine status: Information provided on how to obtain vaccines.   Qualifies for Shingles Vaccine? Yes   Zostavax completed Yes   Shingrix Completed?: Yes  Screening Tests Health Maintenance  Topic Date Due   INFLUENZA VACCINE  08/11/2022   COVID-19 Vaccine (6 - 2023-24 season) 09/11/2022   MAMMOGRAM  11/16/2022   Medicare Annual Wellness (AWV)  12/01/2023   Colonoscopy  12/26/2024   DTaP/Tdap/Td (3 - Td or Tdap) 06/13/2031   Pneumonia Vaccine 15+ Years old  Completed   DEXA SCAN  Completed   Hepatitis C Screening  Completed   Zoster Vaccines- Shingrix  Completed   HPV VACCINES  Aged Out    Health Maintenance  Health Maintenance Due  Topic Date Due   INFLUENZA VACCINE  08/11/2022   COVID-19 Vaccine (6 - 2023-24 season) 09/11/2022   MAMMOGRAM  11/16/2022    Colorectal cancer screening: No longer required.   Mammogram status: Completed 11/15/2021. Repeat every year  Bone Density status: Completed 11/15/2021.   Lung Cancer Screening: (Low Dose CT Chest recommended if Age 17-80 years, 20 pack-year currently smoking OR have quit w/in 15years.) does not qualify.   Lung Cancer Screening Referral: no  Additional Screening:  Hepatitis  C Screening: does qualify; Completed 06/13/2019  Vision Screening: Recommended annual ophthalmology exams for early detection of glaucoma and other disorders of the eye. Is the patient up to date with their annual eye exam?  Yes  Who is the provider or what is the name of the office in which the patient attends annual eye exams? [facility] If pt is not established with a provider, would they like to be referred to a provider to establish care? No .   Dental Screening: Recommended annual dental exams for proper oral hygiene  Diabetic Foot Exam: n/a  Community Resource Referral / Chronic Care Management: CRR required this visit?  No   CCM required this visit?  No     Plan:     I have personally reviewed and noted the following in the  patient's chart:   Medical and social history Use of alcohol, tobacco or illicit drugs  Current medications and supplements including opioid prescriptions. Patient is not currently taking opioid prescriptions. Functional ability and status Nutritional status Physical activity Advanced directives List of other physicians Hospitalizations, surgeries, and ER visits in previous 12 months Vitals Screenings to include cognitive, depression, and falls Referrals and appointments  In addition, I have reviewed and discussed with patient certain preventive protocols, quality metrics, and best practice recommendations. A written personalized care plan for preventive services as well as general preventive health recommendations were provided to patient.     Barb Merino, LPN   19/14/7829   After Visit Summary: (MyChart) Due to this being a telephonic visit, the after visit summary with patients personalized plan was offered to patient via MyChart   Nurse Notes: none

## 2022-12-22 ENCOUNTER — Ambulatory Visit (HOSPITAL_COMMUNITY)
Admission: RE | Admit: 2022-12-22 | Discharge: 2022-12-22 | Disposition: A | Discharge status: Home / Self Care | Payer: Self-pay | Source: Ambulatory Visit | Attending: Nurse Practitioner | Admitting: Nurse Practitioner

## 2022-12-22 DIAGNOSIS — I350 Nonrheumatic aortic (valve) stenosis: Secondary | ICD-10-CM

## 2022-12-22 DIAGNOSIS — E782 Mixed hyperlipidemia: Secondary | ICD-10-CM

## 2022-12-22 DIAGNOSIS — I10 Essential (primary) hypertension: Secondary | ICD-10-CM

## 2022-12-23 ENCOUNTER — Ambulatory Visit (HOSPITAL_COMMUNITY): Payer: Self-pay | Attending: Nurse Practitioner

## 2022-12-23 ENCOUNTER — Other Ambulatory Visit (HOSPITAL_COMMUNITY): Payer: Medicare PPO

## 2022-12-23 DIAGNOSIS — I350 Nonrheumatic aortic (valve) stenosis: Secondary | ICD-10-CM | POA: Diagnosis present

## 2022-12-23 LAB — ECHOCARDIOGRAM COMPLETE
AR max vel: 0.86 cm2
AV Area VTI: 0.8 cm2
AV Area mean vel: 0.86 cm2
AV Mean grad: 39 mm[Hg]
AV Peak grad: 64.6 mm[Hg]
Ao pk vel: 4.02 m/s
Area-P 1/2: 3.85 cm2
P 1/2 time: 377 ms
S' Lateral: 2.6 cm

## 2022-12-26 ENCOUNTER — Telehealth: Payer: Self-pay | Admitting: *Deleted

## 2022-12-26 DIAGNOSIS — I10 Essential (primary) hypertension: Secondary | ICD-10-CM

## 2022-12-26 DIAGNOSIS — E782 Mixed hyperlipidemia: Secondary | ICD-10-CM

## 2022-12-26 DIAGNOSIS — Z1231 Encounter for screening mammogram for malignant neoplasm of breast: Secondary | ICD-10-CM | POA: Diagnosis not present

## 2022-12-26 DIAGNOSIS — Z79899 Other long term (current) drug therapy: Secondary | ICD-10-CM

## 2022-12-26 DIAGNOSIS — I350 Nonrheumatic aortic (valve) stenosis: Secondary | ICD-10-CM

## 2022-12-26 LAB — HM MAMMOGRAPHY

## 2022-12-26 NOTE — Telephone Encounter (Signed)
Receive call from l Erskine Squibb ,representative   To inform  ordering provider  To look at radiologist impression  #1 fromC ardiac calcium score 12/22/22   Scattered ill-defined nodular opacities in the lungs, most notably a branching ill-defined nodular area in the lateral right lower lobe at the base measuring 2.2 x 1.6 cm. This region in particular is favored to be of infectious or inflammatory etiology. Follow-up noncontrast chest CT is recommended in 1 month after trial of antimicrobial therapy due to confirm regression of this finding and exclude neoplasm    Routed  for review E Monge. NP

## 2022-12-27 ENCOUNTER — Encounter: Payer: Self-pay | Admitting: Nurse Practitioner

## 2022-12-27 MED ORDER — ROSUVASTATIN CALCIUM 20 MG PO TABS: 20.0000 mg | ORAL_TABLET | Freq: Every day | ORAL | 3 refills | Status: AC

## 2022-12-27 NOTE — Telephone Encounter (Signed)
Spoke with pt. Pt was notified of results. Pt will start Crestor 20 mg and repeat lab work as directed in 2-3 months. Lab orders will be mailed to pt. Medication sent to pts pharmacy.

## 2022-12-27 NOTE — Telephone Encounter (Signed)
Noted  

## 2022-12-28 ENCOUNTER — Other Ambulatory Visit: Payer: Self-pay | Admitting: Nurse Practitioner

## 2022-12-28 MED ORDER — AZITHROMYCIN 250 MG PO TABS: ORAL_TABLET | ORAL | 0 refills | Status: AC

## 2022-12-29 ENCOUNTER — Encounter: Payer: Self-pay | Admitting: Nurse Practitioner

## 2023-01-02 ENCOUNTER — Telehealth: Payer: Self-pay

## 2023-01-02 NOTE — Telephone Encounter (Signed)
Lmom to discuss echo results. Waiting on a return call. 

## 2023-01-10 NOTE — Telephone Encounter (Signed)
Echo results sent to pt via mychart for viewing.

## 2023-01-17 ENCOUNTER — Telehealth: Payer: Self-pay | Admitting: Nurse Practitioner

## 2023-01-17 NOTE — Telephone Encounter (Signed)
 New Message:       Patient says Linda Allen told her around 03-23-23 to come in for a chest x-ray. She needs an order to get this xray, here at our office please.Please have her nurse to schedule this for her please.

## 2023-02-14 ENCOUNTER — Other Ambulatory Visit: Payer: Self-pay

## 2023-02-14 DIAGNOSIS — I10 Essential (primary) hypertension: Secondary | ICD-10-CM

## 2023-02-14 MED ORDER — AMLODIPINE BESYLATE 5 MG PO TABS: ORAL_TABLET | ORAL | 1 refills | Status: DC

## 2023-02-21 ENCOUNTER — Other Ambulatory Visit: Payer: Self-pay

## 2023-02-21 DIAGNOSIS — Z79899 Other long term (current) drug therapy: Secondary | ICD-10-CM

## 2023-02-21 DIAGNOSIS — E782 Mixed hyperlipidemia: Secondary | ICD-10-CM

## 2023-02-21 DIAGNOSIS — I10 Essential (primary) hypertension: Secondary | ICD-10-CM

## 2023-03-07 ENCOUNTER — Other Ambulatory Visit: Payer: Self-pay | Admitting: Nurse Practitioner

## 2023-03-07 DIAGNOSIS — I10 Essential (primary) hypertension: Secondary | ICD-10-CM

## 2023-03-15 ENCOUNTER — Other Ambulatory Visit: Payer: Self-pay | Admitting: Nurse Practitioner

## 2023-03-15 DIAGNOSIS — I10 Essential (primary) hypertension: Secondary | ICD-10-CM

## 2023-03-15 NOTE — Telephone Encounter (Signed)
 Copied from CRM [id number]. Topic: Clinical - Medication Refill >> Mar 15, 2023 12:15 PM Fuller Mandril wrote: Most Recent Primary Care Visit:  Provider: Barb Merino  Department: [facility]  Visit Type: MEDICARE AWV, SEQUENTIAL  Date: 12/01/2022  Medication: lisinopril (ZESTRIL) 20 MG tablet  Has the patient contacted their pharmacy? Yes (Agent: If no, request that the patient contact the pharmacy for the refill. If patient does not wish to contact the pharmacy document the reason why and proceed with request.) (Agent: If yes, when and what did the pharmacy advise?) gave two additional but need new rx   Is this the correct pharmacy for this prescription? Yes If no, delete pharmacy and type the correct one.  This is the patient's preferred pharmacy:  [facility] [phone] - [place], [place] - [address] AT [facility] [address] [place] [address] Phone: [phone] Fax: [phone]   Has the prescription been filled recently? No - pharmacy gave two   Is the patient out of the medication? Yes  Has the patient been seen for an appointment in the last year OR does the patient have an upcoming appointment? Yes  Can we respond through MyChart? Yes  Agent: Please be advised that Rx refills may take up to 3 business days. We ask that you follow-up with your pharmacy.

## 2023-03-29 DIAGNOSIS — E782 Mixed hyperlipidemia: Secondary | ICD-10-CM | POA: Diagnosis not present

## 2023-03-29 DIAGNOSIS — I10 Essential (primary) hypertension: Secondary | ICD-10-CM | POA: Diagnosis not present

## 2023-03-29 DIAGNOSIS — Z79899 Other long term (current) drug therapy: Secondary | ICD-10-CM | POA: Diagnosis not present

## 2023-03-29 LAB — LIPID PANEL
Chol/HDL Ratio: 1.7 ratio (ref 0.0–4.4)
Cholesterol, Total: 190 mg/dL (ref 100–199)
HDL: 112 mg/dL (ref >39)
LDL Chol Calc (NIH): 69 mg/dL (ref 0–99)
Triglycerides: 42 mg/dL (ref 0–149)
VLDL Cholesterol Cal: 9 mg/dL (ref 5–40)

## 2023-03-29 LAB — HEPATIC FUNCTION PANEL
ALT: 15 IU/L (ref 0–32)
AST: 16 IU/L (ref 0–40)
Albumin: 4.3 g/dL (ref 3.8–4.8)
Alkaline Phosphatase: 63 IU/L (ref 44–121)
Bilirubin Total: 0.5 mg/dL (ref 0.0–1.2)
Bilirubin, Direct: 0.18 mg/dL (ref 0.00–0.40)
Total Protein: 7.2 g/dL (ref 6.0–8.5)

## 2023-04-06 ENCOUNTER — Telehealth: Payer: Self-pay

## 2023-04-06 NOTE — Telephone Encounter (Signed)
Spoke with pt. Pt was notified of lab results and voiced understanding.

## 2023-04-11 ENCOUNTER — Ambulatory Visit: Payer: Medicare PPO | Admitting: Nurse Practitioner

## 2023-04-18 ENCOUNTER — Ambulatory Visit: Admitting: Nurse Practitioner

## 2023-04-19 ENCOUNTER — Encounter: Payer: Self-pay | Admitting: Nurse Practitioner

## 2023-04-19 ENCOUNTER — Ambulatory Visit: Admitting: Nurse Practitioner

## 2023-04-19 VITALS — BP 120/74 | HR 85 | Temp 98.6°F | Ht 68.0 in | Wt 262.0 lb

## 2023-04-19 DIAGNOSIS — Z6841 Body Mass Index (BMI) 40.0 and over, adult: Secondary | ICD-10-CM

## 2023-04-19 DIAGNOSIS — E782 Mixed hyperlipidemia: Secondary | ICD-10-CM

## 2023-04-19 DIAGNOSIS — I10 Essential (primary) hypertension: Secondary | ICD-10-CM

## 2023-04-19 DIAGNOSIS — Z2821 Immunization not carried out because of patient refusal: Secondary | ICD-10-CM | POA: Diagnosis not present

## 2023-04-19 NOTE — Progress Notes (Signed)
 [place], CMA,acting as a Neurosurgeon for Susanna Epley, FNP.,have documented all relevant documentation on the behalf of Susanna Epley, FNP,as directed by  Susanna Epley, FNP while in the presence of Susanna Epley, FNP.  Subjective:  Patient ID: Linda Allen , female    DOB: 25-Oct-1945 , 78 y.o.   MRN: [id number]  Chief Complaint  Patient presents with   Hypertension    HPI  Patient presents today for a bp and chol  follow up, Patient reports compliance with medication. Patient denies any chest pain, SOB, or headaches. Patient has no concerns today.   She is not checking her BP at home but has had no episodes of headache, dizziness, blurry vision.  She is walking 4-5 time per week for one hour, following a low salt diet, and slowly increasing her water intake.   She suffers from seasonal allergies, has some nasal congestion today but states that she has relief with OTC medications.    Hypertension This is a chronic problem. The current episode started more than 1 year ago. The problem has been gradually improving since onset. The problem is controlled. Pertinent negatives include no chest pain, headaches or shortness of breath. There are no associated agents to hypertension. Risk factors for coronary artery disease include diabetes mellitus and obesity. Past treatments include ACE inhibitors and beta blockers. The current treatment provides significant improvement.     Past Medical History:  Diagnosis Date   Allergy    Angio-edema    Arthritis    L hip   Atypical mole 01/21/2014   SEBACECIOUS ATYPIA RIGHT SCALP = MOHS   Heart murmur    Hypertension    Pneumonia    Scleroderma (HCC)    Urticaria      Family History  Problem Relation Age of Onset   CVA Mother    Parkinson's disease Father    CVA Father    Heart attack Father    Eczema Sister    Ulcerative colitis Brother    Heart failure Paternal Grandmother      Current Outpatient Medications:    amLODipine  (NORVASC) 5 MG tablet, TAKE 1 TABLET BY MOUTH DAILY, Disp: 90 tablet, Rfl: 1   EPINEPHrine 0.3 mg/0.3 mL IJ SOAJ injection, Inject 0.3 mg into the muscle as needed for anaphylaxis., Disp: 2 each, Rfl: 2   hydrOXYzine (ATARAX) 10 MG tablet, Take 1 tablet (10 mg total) by mouth 3 (three) times daily as needed for itching., Disp: 60 tablet, Rfl: 2   lisinopril (ZESTRIL) 20 MG tablet, TAKE 1 TABLET BY MOUTH DAILY, Disp: 90 tablet, Rfl: 1   mometasone (ELOCON) 0.1 % ointment, APPLY TWICE DAILY AS NEEDED. DO NOT USE ON THE FACE, NECK, ARMPITS OR GROIN AREA. DO NOT USE MORE THAN 3 WEEKS IN A ROW. STOP WHEN CLEAR., Disp: 180 g, Rfl: 0   doxycycline (VIBRAMYCIN) 50 MG capsule, Take 1 capsule (50 mg total) by mouth 2 (two) times daily. (Patient not taking: Reported on 12/01/2022), Disp: 14 capsule, Rfl: 0   rosuvastatin (CRESTOR) 20 MG tablet, Take 1 tablet (20 mg total) by mouth daily., Disp: 90 tablet, Rfl: 3   Allergies  Allergen Reactions   Bee Venom Shortness Of Breath   Ivp Dye [Iodinated Contrast Media] Shortness Of Breath and Rash   Other Rash    Contrast dye   Penicillins Hives and Rash    Tolerated Cephalosporin Date: 07/09/19.       Review of Systems  Constitutional: Negative.  HENT:  Positive for congestion, rhinorrhea and sneezing.   Respiratory: Negative.  Negative for shortness of breath.   Cardiovascular: Negative.  Negative for chest pain.  Gastrointestinal: Negative.   Neurological: Negative.  Negative for headaches.  All other systems reviewed and are negative.    Today's Vitals   04/19/23 1629  BP: 120/74  Pulse: 85  Temp: 98.6 F (37 C)  TempSrc: Oral  Weight: 262 lb (118.8 kg)  Height: 5\' 8"  (1.727 m)  PainSc: 0-No pain   Body mass index is 39.84 kg/m.  Wt Readings from Last 3 Encounters:  04/19/23 262 lb (118.8 kg)  11/11/22 265 lb (120.2 kg)  10/11/22 262 lb 9.6 oz (119.1 kg)     Objective:  Physical Exam Constitutional:      Appearance: Normal  appearance. She is obese.  HENT:     Head: Normocephalic and atraumatic.     Nose: Congestion and rhinorrhea present.  Eyes:     Pupils: Pupils are equal, round, and reactive to light.  Cardiovascular:     Rate and Rhythm: Normal rate and regular rhythm.     Pulses: Normal pulses.     Heart sounds: Normal heart sounds.  Pulmonary:     Effort: Pulmonary effort is normal. No respiratory distress.     Breath sounds: Normal breath sounds. No wheezing.  Neurological:     Mental Status: She is alert.  Psychiatric:        Mood and Affect: Mood normal.        Behavior: Behavior normal.        Thought Content: Thought content normal.      Assessment And Plan:  Essential hypertension Assessment & Plan: Blood pressure stable, continue medications as prescribed, focus on lifestyle modifications  Orders: -     BMP8+eGFR  Mixed hyperlipidemia Assessment & Plan: Cholesterol levels are stable.    COVID-19 vaccination declined Assessment & Plan: Declines covid 19 vaccine. Discussed risk of covid 43 and if she changes her mind about the vaccine to call the office. Education has been provided regarding the importance of this vaccine but patient still declined. Advised may receive this vaccine at local pharmacy or Health Dept.or vaccine clinic. Aware to provide a copy of the vaccination record if obtained from local pharmacy or Health Dept.  Encouraged to take multivitamin, vitamin d, vitamin c and zinc to increase immune system. Aware can call office if would like to have vaccine here at office. Verbalized acceptance and understanding.    Morbid obesity with BMI of 40.0-44.9, adult [facility]) Assessment & Plan: She is encouraged to strive for BMI less than 30 to decrease cardiac risk. Advised to aim for at least 150 minutes of exercise per week.     Return in 6 months (on 10/19/2023) for physical; schedule AWV in December.  Patient was given opportunity to ask questions. Patient verbalized  understanding of the plan and was able to repeat key elements of the plan. All questions were answered to their satisfaction.   I have reviewed this encounter including the documentation in this note and/or discussed this patient with Mickael Alamo FNP Student. I am certifying that I agree with the content of this note as the primary care nurse practitioner.  Susanna Epley, DNP, FNP-BC  I, Susanna Epley, FNP, have reviewed all documentation for this visit. The documentation on 04/27/23 for the exam, diagnosis, procedures, and orders are all accurate and complete.   IF YOU HAVE BEEN REFERRED TO A SPECIALIST, IT  MAY TAKE 1-2 WEEKS TO SCHEDULE/PROCESS THE REFERRAL. IF YOU HAVE NOT HEARD FROM US /SPECIALIST IN TWO WEEKS, PLEASE GIVE US  A CALL AT [phone] X [phone].

## 2023-04-19 NOTE — Assessment & Plan Note (Addendum)
 Blood pressure stable, continue medications as prescribed, focus on lifestyle modifications

## 2023-04-20 LAB — BMP8+EGFR
BUN/Creatinine Ratio: 30 — ABNORMAL HIGH (ref 12–28)
BUN: 40 mg/dL — ABNORMAL HIGH (ref 8–27)
CO2: 21 mmol/L (ref 20–29)
Calcium: 10 mg/dL (ref 8.7–10.3)
Chloride: 103 mmol/L (ref 96–106)
Creatinine, Ser: 1.34 mg/dL — ABNORMAL HIGH (ref 0.57–1.00)
Glucose: 99 mg/dL (ref 70–99)
Potassium: 5.1 mmol/L (ref 3.5–5.2)
Sodium: 140 mmol/L (ref 134–144)
eGFR: 41 mL/min/{1.73_m2} — ABNORMAL LOW (ref >59)

## 2023-04-27 ENCOUNTER — Encounter: Payer: Self-pay | Admitting: Nurse Practitioner

## 2023-04-27 DIAGNOSIS — Z2821 Immunization not carried out because of patient refusal: Secondary | ICD-10-CM | POA: Insufficient documentation

## 2023-04-27 NOTE — Assessment & Plan Note (Signed)

## 2023-04-27 NOTE — Assessment & Plan Note (Signed)
 Cholesterol levels are stable

## 2023-04-27 NOTE — Assessment & Plan Note (Signed)
 She is encouraged to strive for BMI less than 30 to decrease cardiac risk. Advised to aim for at least 150 minutes of exercise per week.

## 2023-06-26 ENCOUNTER — Ambulatory Visit (HOSPITAL_COMMUNITY)
Admission: RE | Admit: 2023-06-26 | Discharge: 2023-06-26 | Disposition: A | Discharge status: Home / Self Care | Payer: Medicare PPO | Source: Ambulatory Visit | Attending: Cardiology | Admitting: Cardiology

## 2023-06-26 DIAGNOSIS — I350 Nonrheumatic aortic (valve) stenosis: Secondary | ICD-10-CM | POA: Diagnosis not present

## 2023-06-26 LAB — ECHOCARDIOGRAM COMPLETE
AR max vel: 1.05 cm2
AV Area VTI: 1.05 cm2
AV Area mean vel: 1.01 cm2
AV Mean grad: 35.3 mmHg
AV Peak grad: 62.3 mmHg
Ao pk vel: 3.95 m/s
Area-P 1/2: 4.26 cm2
P 1/2 time: 419 ms
S' Lateral: 2.8 cm

## 2023-06-29 ENCOUNTER — Ambulatory Visit: Payer: Self-pay | Admitting: Nurse Practitioner

## 2023-06-30 NOTE — Telephone Encounter (Signed)
 Pt returning call to nurse

## 2023-06-30 NOTE — Progress Notes (Signed)
 Left vm for patient to cb for echo results

## 2023-06-30 NOTE — Progress Notes (Signed)
 Spoke with patient regarding echo. No questions or concerns at this time.

## 2023-07-04 DIAGNOSIS — H53143 Visual discomfort, bilateral: Secondary | ICD-10-CM | POA: Diagnosis not present

## 2023-07-04 DIAGNOSIS — H1045 Other chronic allergic conjunctivitis: Secondary | ICD-10-CM | POA: Diagnosis not present

## 2023-07-04 DIAGNOSIS — H04123 Dry eye syndrome of bilateral lacrimal glands: Secondary | ICD-10-CM | POA: Diagnosis not present

## 2023-09-04 DIAGNOSIS — L03113 Cellulitis of right upper limb: Secondary | ICD-10-CM | POA: Diagnosis not present

## 2023-09-06 ENCOUNTER — Other Ambulatory Visit: Payer: Self-pay | Admitting: Nurse Practitioner

## 2023-09-06 DIAGNOSIS — I10 Essential (primary) hypertension: Secondary | ICD-10-CM

## 2023-10-12 ENCOUNTER — Encounter: Payer: Self-pay | Admitting: Nurse Practitioner

## 2023-10-22 ENCOUNTER — Encounter (HOSPITAL_COMMUNITY): Payer: Self-pay

## 2023-10-22 ENCOUNTER — Ambulatory Visit (HOSPITAL_COMMUNITY)
Admission: RE | Admit: 2023-10-22 | Discharge: 2023-10-22 | Disposition: A | Discharge status: Home / Self Care | Attending: Emergency Medicine | Admitting: Emergency Medicine

## 2023-10-22 VITALS — BP 158/77 | HR 78 | Temp 98.4°F | Resp 16

## 2023-10-22 DIAGNOSIS — R21 Rash and other nonspecific skin eruption: Secondary | ICD-10-CM

## 2023-10-22 MED ORDER — DEXAMETHASONE SOD PHOSPHATE PF 10 MG/ML IJ SOLN
10.0000 mg | Freq: Once | INTRAMUSCULAR | Status: AC
Start: 2023-10-22 — End: 2023-10-22
  Administered 2023-10-22: 10 mg via INTRAMUSCULAR

## 2023-10-22 MED ORDER — CETIRIZINE HCL 10 MG PO TABS: 10.0000 mg | ORAL_TABLET | Freq: Every day | ORAL | 0 refills | Status: AC

## 2023-10-22 NOTE — Discharge Instructions (Signed)
 You appear to be having an allergic reaction, it is unsure what this is to.  We have given you a steroid injection here in clinic to help with the itchy rash.  Start taking Zyrtec daily as well.  If you have breakthrough itching you can take the hydroxyzine  or Benadryl .  These may cause drowsiness or sedation.  I suggest doing washing all linens, towels and clothing in hypoallergenic detergent such as Tide free and clear.  Please go home and shower and use hypoallergenic soap such as D[employer] sensitive skin.  If this persists or returns it may be beneficial to follow-up with an allergist.  Return to clinic if no improvement or any changes.

## 2023-10-22 NOTE — ED Triage Notes (Signed)
 Patient here today with c/o itchy, burning rash all over since Thursday. Patient has taken Benadryl  with some relief. She has also tried taking Hydroxyzine  and use Mometasone  cream with no relief. Patient had those from a previous allergic reaction.   Patient is also c/o productive cough and nasal drainage X 2 days. She does not think that they are related. Denies taking anything for those symptoms.

## 2023-10-22 NOTE — ED Provider Notes (Signed)
 [facility]    CSN: [id number] Arrival date & time: 10/22/23  1007      History   Chief Complaint Chief Complaint  Patient presents with   Allergic Reaction    Itching horribly over a large area of my body. I've experienced this before and I need some help. - Entered by patient   Cough    HPI Linda Allen is a 78 y.o. female.   Patient presents to clinic over concern of an erythematous itchy rash that began 4 days ago.  She has not changed anything in her soaps, detergents or diet.  Reports similar reaction has happened in the past years ago, unsure what triggered it then.  Has taken Benadryl , hydroxyzine  and topical steroid cream without much improvement.  Feels like the rash is getting worse and spreading by the day.  Without airway involvement.  Feels like the area to the right arm is the most itchy and inflamed.  Has rash on her bilateral arms, abdomen, breast and legs as well.  The history is provided by the patient and medical records.  Allergic Reaction Cough   Past Medical History:  Diagnosis Date   Allergy    Angio-edema    Arthritis    L hip   Atypical mole 01/21/2014   SEBACECIOUS ATYPIA RIGHT SCALP = MOHS   Heart murmur    Hypertension    Pneumonia    Scleroderma (HCC)    Urticaria     Patient Active Problem List   Diagnosis Date Noted   COVID-19 vaccination declined 04/27/2023   Abnormal glucose 10/11/2022   Class 2 obesity due to excess calories with body mass index (BMI) of 39.0 to 39.9 in adult 10/11/2022   Encounter for annual health examination 10/11/2022   Acute non-recurrent frontal sinusitis 10/11/2022   Mixed hyperlipidemia 08/15/2022   H/O bee sting allergy 08/15/2022   Sneezing 08/15/2022   S/P shoulder replacement, right 12/15/2021   Rash and other nonspecific skin eruption 01/01/2020   Pruritus 01/01/2020   Left hip OA 07/09/2019   S/P left THA, AA 07/09/2019   S/P hip replacement, left 07/09/2019    Osteoarthritis of left shoulder 04/04/2018   Arthritis L hip, R shoulder, both knees    Morbid obesity with BMI of 40.0-44.9, adult (HCC) 01/12/2010   Essential hypertension 12/22/2008   MURMUR 12/22/2008    Past Surgical History:  Procedure Laterality Date   ADENOIDECTOMY     ENDOVENOUS ABLATION SAPHENOUS VEIN W/ LASER Left 09/26/2018   endovenous laser ablation left greater saphenous vein by Carlin Haddock MD    ENDOVENOUS ABLATION SAPHENOUS VEIN W/ LASER Right 10/17/2018   endovenous laser ablation right greater saphenous vein by Carlin Haddock MD    EYE SURGERY     JOINT REPLACEMENT  2011, 2012   both knees   REVERSE SHOULDER ARTHROPLASTY Left 09/20/2019   Procedure: REVERSE SHOULDER ARTHROPLASTY;  Surgeon: Kay Kemps, MD;  Location: [facility];  Service: Orthopedics;  Laterality: Left;   REVERSE SHOULDER ARTHROPLASTY Right 12/15/2021   Procedure: Right REVERSE SHOULDER ARTHROPLASTY;  Surgeon: Kay Kemps, MD;  Location: [facility];  Service: Orthopedics;  Laterality: Right;  120 min choice and interscalene block   SHOULDER ACROMIOPLASTY Right 2012   TONSILLECTOMY     TOTAL HIP ARTHROPLASTY Left 07/09/2019   Procedure: TOTAL HIP ARTHROPLASTY ANTERIOR APPROACH;  Surgeon: Ernie Cough, MD;  Location: [facility];  Service: Orthopedics;  Laterality: Left;  70 mins    OB History  No obstetric history on file.      Home Medications    Prior to Admission medications   Medication Sig Start Date End Date Taking? Authorizing Provider  cetirizine (ZYRTEC ALLERGY) 10 MG tablet Take 1 tablet (10 mg total) by mouth daily. 10/22/23  Yes Kysha Muralles  N, FNP  amLODipine  (NORVASC ) 5 MG tablet TAKE 1 TABLET BY MOUTH DAILY 03/07/23   Georgina Speaks, FNP  EPINEPHrine  0.3 mg/0.3 mL IJ SOAJ injection Inject 0.3 mg into the muscle as needed for anaphylaxis. 08/15/22   Georgina Speaks, FNP  hydrOXYzine  (ATARAX ) 10 MG tablet Take 1 tablet (10 mg total) by mouth 3 (three) times daily as needed for  itching. 02/02/22   Luke Orlan HERO, DO  lisinopril  (ZESTRIL ) 20 MG tablet TAKE 1 TABLET BY MOUTH DAILY 09/06/23   Moore, Janece, FNP  mometasone  (ELOCON ) 0.1 % ointment APPLY TWICE DAILY AS NEEDED. DO NOT USE ON THE FACE, NECK, ARMPITS OR GROIN AREA. DO NOT USE MORE THAN 3 WEEKS IN A ROW. STOP WHEN CLEAR. 03/08/22   Luke Orlan HERO, DO  rosuvastatin  (CRESTOR ) 20 MG tablet Take 1 tablet (20 mg total) by mouth daily. 12/27/22 03/27/23  Daneen Damien BROCKS, NP  ferrous sulfate  (FERROUSUL) 325 (65 FE) MG tablet Take 1 tablet (325 mg total) by mouth 3 (three) times daily with meals for 14 days. Patient not taking: Reported on 09/12/2019 07/10/19 11/07/19  Danella Cough, PA-C    Family History Family History  Problem Relation Age of Onset   CVA Mother    Parkinson's disease Father    CVA Father    Heart attack Father    Eczema Sister    Ulcerative colitis Brother    Heart failure Paternal Grandmother     Social History Social History   Tobacco Use   Smoking status: Former    Current packs/day: 0.00    Types: Cigarettes    Start date: 01/01/1971    Quit date: 12/31/1980    Years since quitting: 42.8   Smokeless tobacco: Never   Tobacco comments:    smoked for 10 yeas, d/c 40 y ago  Vaping Use   Vaping status: Never Used  Substance Use Topics   Alcohol use: Yes    Comment: occas.   Drug use: Never     Allergies   Bee venom, Ivp dye [iodinated contrast media], Other, and Penicillins   Review of Systems Review of Systems  Per HPI  Physical Exam Triage Vital Signs ED Triage Vitals  Encounter Vitals Group     BP 10/22/23 1029 (!) 158/77     Girls Systolic BP Percentile --      Girls Diastolic BP Percentile --      Boys Systolic BP Percentile --      Boys Diastolic BP Percentile --      Pulse Rate 10/22/23 1029 78     Resp 10/22/23 1029 16     Temp 10/22/23 1029 98.4 F (36.9 C)     Temp Source 10/22/23 1029 Oral     SpO2 10/22/23 1029 94 %     Weight --      Height --      Head  Circumference --      Peak Flow --      Pain Score 10/22/23 1028 8     Pain Loc --      Pain Education --      Exclude from Growth Chart --    No data found.  Updated Vital Signs BP (!) 158/77 (BP Location: Left Arm)   Pulse 78   Temp 98.4 F (36.9 C) (Oral)   Resp 16   SpO2 94%   Visual Acuity Right Eye Distance:   Left Eye Distance:   Bilateral Distance:    Right Eye Near:   Left Eye Near:    Bilateral Near:     Physical Exam Vitals and nursing note reviewed.  Constitutional:      Appearance: Normal appearance.  HENT:     Head: Normocephalic and atraumatic.     Right Ear: External ear normal.     Left Ear: External ear normal.     Nose: Congestion present.     Mouth/Throat:     Mouth: Mucous membranes are moist.  Eyes:     Conjunctiva/sclera: Conjunctivae normal.  Cardiovascular:     Rate and Rhythm: Normal rate.  Pulmonary:     Effort: Pulmonary effort is normal. No respiratory distress.  Skin:    General: Skin is warm and dry.     Findings: Erythema and rash present.     Comments: Erythematous urticarial rash spread across the bilateral arms and legs.  Underneath and across both breasts and abdomen area.  Neurological:     General: No focal deficit present.     Mental Status: She is alert and oriented to person, place, and time.  Psychiatric:        Mood and Affect: Mood normal.        Behavior: Behavior normal. Behavior is cooperative.      UC Treatments / Results  Labs (all labs ordered are listed, but only abnormal results are displayed) Labs Reviewed - No data to display  EKG   Radiology No results found.  Procedures Procedures (including critical care time)  Medications Ordered in UC Medications  dexamethasone  (DECADRON ) injection 10 mg (10 mg Intramuscular Given 10/22/23 1103)    Initial Impression / Assessment and Plan / UC Course  I have reviewed the triage vital signs and the nursing notes.  Pertinent labs & imaging results  that were available during my care of the patient were reviewed by me and considered in my medical decision making (see chart for details).  Vitals and triage reviewed, patient is hemodynamically stable.  Able to speak in full sentences, without airway involvement, low concern for anaphylaxis or angioedema.  Does not appear to be related to medications, has not had antibiotics in the past 3 months or medication changes.  Unclear etiology, could be environmental.  IM steroid given in clinic, no history of type 2 diabetes and most recent A1c was within normal limits.  Advised second-generation antihistamine as well.  Allergy follow-up as needed.  Plan of care, follow-up care return precautions given, no questions at this time.     Final Clinical Impressions(s) / UC Diagnoses   Final diagnoses:  Rash and nonspecific skin eruption     Discharge Instructions      You appear to be having an allergic reaction, it is unsure what this is to.  We have given you a steroid injection here in clinic to help with the itchy rash.  Start taking Zyrtec daily as well.  If you have breakthrough itching you can take the hydroxyzine  or Benadryl .  These may cause drowsiness or sedation.  I suggest doing washing all linens, towels and clothing in hypoallergenic detergent such as Tide free and clear.  Please go home and shower and use hypoallergenic soap such as [employer]  sensitive skin.  If this persists or returns it may be beneficial to follow-up with an allergist.  Return to clinic if no improvement or any changes.     ED Prescriptions     Medication Sig Dispense Auth. Provider   cetirizine (ZYRTEC ALLERGY) 10 MG tablet Take 1 tablet (10 mg total) by mouth daily. 30 tablet Dreama, Emberlie Gotcher  N, FNP      PDMP not reviewed this encounter.   Dreama, Council Munguia  N, FNP 10/22/23 1106

## 2023-11-01 ENCOUNTER — Other Ambulatory Visit: Payer: Self-pay | Admitting: Nurse Practitioner

## 2023-11-01 ENCOUNTER — Encounter: Admitting: Nurse Practitioner

## 2023-11-01 DIAGNOSIS — I10 Essential (primary) hypertension: Secondary | ICD-10-CM

## 2023-11-01 NOTE — Progress Notes (Deleted)
 Linda Allen, CMA,acting as a Neurosurgeon for Linda Ada, FNP.,have documented all relevant documentation on the behalf of Linda Ada, FNP,as directed by  Linda Ada, FNP while in the presence of Linda Ada, FNP.  Subjective:    Patient ID: Linda Allen , female    DOB: 1945/03/02 , 78 y.o.   MRN: [id number]  No chief complaint on file.   HPI  HPI   Past Medical History:  Diagnosis Date   Allergy    Angio-edema    Arthritis    L hip   Atypical mole 01/21/2014   SEBACECIOUS ATYPIA RIGHT SCALP = MOHS   Heart murmur    Hypertension    Pneumonia    Scleroderma (HCC)    Urticaria      Family History  Problem Relation Age of Onset   CVA Mother    Parkinson's disease Father    CVA Father    Heart attack Father    Eczema Sister    Ulcerative colitis Brother    Heart failure Paternal Grandmother      Current Outpatient Medications:    amLODipine  (NORVASC ) 5 MG tablet, TAKE 1 TABLET BY MOUTH DAILY, Disp: 90 tablet, Rfl: 1   cetirizine (ZYRTEC ALLERGY) 10 MG tablet, Take 1 tablet (10 mg total) by mouth daily., Disp: 30 tablet, Rfl: 0   EPINEPHrine  0.3 mg/0.3 mL IJ SOAJ injection, Inject 0.3 mg into the muscle as needed for anaphylaxis., Disp: 2 each, Rfl: 2   hydrOXYzine  (ATARAX ) 10 MG tablet, Take 1 tablet (10 mg total) by mouth 3 (three) times daily as needed for itching., Disp: 60 tablet, Rfl: 2   lisinopril  (ZESTRIL ) 20 MG tablet, TAKE 1 TABLET BY MOUTH DAILY, Disp: 90 tablet, Rfl: 1   mometasone  (ELOCON ) 0.1 % ointment, APPLY TWICE DAILY AS NEEDED. DO NOT USE ON THE FACE, NECK, ARMPITS OR GROIN AREA. DO NOT USE MORE THAN 3 WEEKS IN A ROW. STOP WHEN CLEAR., Disp: 180 g, Rfl: 0   rosuvastatin  (CRESTOR ) 20 MG tablet, Take 1 tablet (20 mg total) by mouth daily., Disp: 90 tablet, Rfl: 3   Allergies  Allergen Reactions   Bee Venom Shortness Of Breath   Ivp Dye [Iodinated Contrast Media] Shortness Of Breath and Rash   Other Rash    Contrast dye   Penicillins  Hives and Rash    Tolerated Cephalosporin Date: 07/09/19.        The patient states she uses {contraceptive methods:5051} for birth control. No LMP recorded. Patient is postmenopausal.. {Dysmenorrhea-menorrhagia:21918}. Negative for: breast discharge, breast lump(s), breast pain and breast self exam. Associated symptoms include abnormal vaginal bleeding. Pertinent negatives include abnormal bleeding (hematology), anxiety, decreased libido, depression, difficulty falling sleep, dyspareunia, history of infertility, nocturia, sexual dysfunction, sleep disturbances, urinary incontinence, urinary urgency, vaginal discharge and vaginal itching. Diet regular.The patient states her exercise level is    . The patient's tobacco use is:  Social History   Tobacco Use  Smoking Status Former   Current packs/day: 0.00   Types: Cigarettes   Start date: 01/01/1971   Quit date: 12/31/1980   Years since quitting: 42.8  Smokeless Tobacco Never  Tobacco Comments   smoked for 10 yeas, d/c 40 y ago  . She has been exposed to passive smoke. The patient's alcohol use is:  Social History   Substance and Sexual Activity  Alcohol Use Yes   Comment: occas.  . Additional information: Last pap ***, next one scheduled for ***.    Review  of Systems   There were no vitals filed for this visit. There is no height or weight on file to calculate BMI.  Wt Readings from Last 3 Encounters:  04/19/23 262 lb (118.8 kg)  11/11/22 265 lb (120.2 kg)  10/11/22 262 lb 9.6 oz (119.1 kg)     Objective:  Physical Exam      Assessment And Plan:     Encounter for annual health examination  Essential hypertension  Mixed hyperlipidemia  Abnormal glucose     No follow-ups on file. Patient was given opportunity to ask questions. Patient verbalized understanding of the plan and was able to repeat key elements of the plan. All questions were answered to their satisfaction.   Linda Ada, FNP  I, Linda Ada,  FNP, have reviewed all documentation for this visit. The documentation on 11/01/23 for the exam, diagnosis, procedures, and orders are all accurate and complete.

## 2023-12-05 DIAGNOSIS — M542 Cervicalgia: Secondary | ICD-10-CM | POA: Diagnosis not present

## 2023-12-20 ENCOUNTER — Ambulatory Visit: Payer: Medicare PPO

## 2023-12-20 ENCOUNTER — Ambulatory Visit: Payer: Self-pay

## 2023-12-20 ENCOUNTER — Ambulatory Visit

## 2023-12-27 ENCOUNTER — Ambulatory Visit: Payer: Self-pay

## 2023-12-27 DIAGNOSIS — Z0000 Encounter for general adult medical examination without abnormal findings: Secondary | ICD-10-CM

## 2023-12-27 NOTE — Progress Notes (Cosign Needed)
 Chief Complaint  Patient presents with   Medicare Wellness     Subjective:   Linda Allen is a 78 y.o. female who presents for a Medicare Annual Wellness Visit.  Visit info / Clinical Intake: Medicare Wellness Visit Type:: Subsequent Annual Wellness Visit Persons participating in visit and providing information:: patient Medicare Wellness Visit Mode:: Telephone If telephone:: video declined Since this visit was completed virtually, some vitals may be partially provided or unavailable. Missing vitals are due to the limitations of the virtual format.: Unable to obtain vitals - no equipment If Telephone or Video please confirm:: I connected with patient using audio/video enable telemedicine. I verified patient identity with two identifiers, discussed telehealth limitations, and patient agreed to proceed. Patient Location:: home Provider Location:: office Interpreter Needed?: No Pre-visit prep was completed: yes AWV questionnaire completed by patient prior to visit?: no Living arrangements:: lives with spouse/significant other Patient's Overall Health Status Rating: good Typical amount of pain: (!) a lot Does pain affect daily life?: (!) yes Are you currently prescribed opioids?: no  Dietary Habits and Nutritional Risks How many meals a day?: 3 Eats fruit and vegetables daily?: yes Most meals are obtained by: preparing own meals In the last 2 weeks, have you had any of the following?: none Diabetic:: no  Functional Status Activities of Daily Living (to include ambulation/medication): Independent Ambulation: Independent Medication Administration: Independent Home Management (perform basic housework or laundry): Independent Manage your own finances?: yes Primary transportation is: driving Concerns about vision?: no *vision screening is required for WTM* Concerns about hearing?: no  Fall Screening Falls in the past year?: 0 Number of falls in past year: 0 Was there  an injury with Fall?: 0 Fall Risk Category Calculator: 0 Patient Fall Risk Level: Low Fall Risk  Fall Risk Patient at Risk for Falls Due to: Medication side effect Fall risk Follow up: Falls prevention discussed; Falls evaluation completed  Home and Transportation Safety: All rugs have non-skid backing?: yes All stairs or steps have railings?: yes Grab bars in the bathtub or shower?: yes Have non-skid surface in bathtub or shower?: yes Good home lighting?: yes Regular seat belt use?: yes Hospital stays in the last year:: no  Cognitive Assessment Difficulty concentrating, remembering, or making decisions? : no Will 6CIT or Mini Cog be Completed: yes What year is it?: 0 points What month is it?: 0 points Give patient an address phrase to remember (5 components): [address] About what time is it?: 0 points Count backwards from 20 to 1: 0 points Say the months of the year in reverse: 2 points Repeat the address phrase from earlier: 2 points 6 CIT Score: 4 points  Advance Directives (For Healthcare) Does Patient Have a Medical Advance Directive?: Yes Does patient want to make changes to medical advance directive?: No - Patient declined Type of Advance Directive: Healthcare Power of [place]; Living will Copy of Healthcare Power of Attorney in Chart?: Yes - validated most recent copy scanned in chart (See row information) Copy of Living Will in Chart?: Yes - validated most recent copy scanned in chart (See row information)  Reviewed/Updated  Reviewed/Updated: Reviewed All (Medical, Surgical, Family, Medications, Allergies, Care Teams, Patient Goals)    Allergies (verified) Bee venom, Ivp dye [iodinated contrast media], Other, and Penicillins   Current Medications (verified) Outpatient Encounter Medications as of 12/27/2023  Medication Sig   amLODipine  (NORVASC ) 5 MG tablet TAKE 1 TABLET BY MOUTH ONCE DAILY   cetirizine  (ZYRTEC  ALLERGY) 10  MG tablet Take 1 tablet  (10 mg total) by mouth daily.   EPINEPHrine  0.3 mg/0.3 mL IJ SOAJ injection Inject 0.3 mg into the muscle as needed for anaphylaxis.   hydrOXYzine  (ATARAX ) 10 MG tablet Take 1 tablet (10 mg total) by mouth 3 (three) times daily as needed for itching.   lisinopril  (ZESTRIL ) 20 MG tablet TAKE 1 TABLET BY MOUTH DAILY   mometasone  (ELOCON ) 0.1 % ointment APPLY TWICE DAILY AS NEEDED. DO NOT USE ON THE FACE, NECK, ARMPITS OR GROIN AREA. DO NOT USE MORE THAN 3 WEEKS IN A ROW. STOP WHEN CLEAR.   rosuvastatin  (CRESTOR ) 20 MG tablet Take 1 tablet (20 mg total) by mouth daily.   [DISCONTINUED] ferrous sulfate  (FERROUSUL) 325 (65 FE) MG tablet Take 1 tablet (325 mg total) by mouth 3 (three) times daily with meals for 14 days. (Patient not taking: Reported on 09/12/2019)   No facility-administered encounter medications on file as of 12/27/2023.    History: Past Medical History:  Diagnosis Date   Allergy    Angio-edema    Arthritis    L hip   Atypical mole 01/21/2014   SEBACECIOUS ATYPIA RIGHT SCALP = MOHS   Heart murmur    Hypertension    Pneumonia    Scleroderma (HCC)    Urticaria    Past Surgical History:  Procedure Laterality Date   ADENOIDECTOMY     ENDOVENOUS ABLATION SAPHENOUS VEIN W/ LASER Left 09/26/2018   endovenous laser ablation left greater saphenous vein by Carlin Haddock MD    ENDOVENOUS ABLATION SAPHENOUS VEIN W/ LASER Right 10/17/2018   endovenous laser ablation right greater saphenous vein by Carlin Haddock MD    EYE SURGERY     JOINT REPLACEMENT  2011, 2012   both knees   REVERSE SHOULDER ARTHROPLASTY Left 09/20/2019   Procedure: REVERSE SHOULDER ARTHROPLASTY;  Surgeon: Kay Kemps, MD;  Location: [facility];  Service: Orthopedics;  Laterality: Left;   REVERSE SHOULDER ARTHROPLASTY Right 12/15/2021   Procedure: Right REVERSE SHOULDER ARTHROPLASTY;  Surgeon: Kay Kemps, MD;  Location: [facility];  Service: Orthopedics;  Laterality: Right;  120 min choice and interscalene block    SHOULDER ACROMIOPLASTY Right 2012   TONSILLECTOMY     TOTAL HIP ARTHROPLASTY Left 07/09/2019   Procedure: TOTAL HIP ARTHROPLASTY ANTERIOR APPROACH;  Surgeon: Ernie Cough, MD;  Location: [facility];  Service: Orthopedics;  Laterality: Left;  70 mins   Family History  Problem Relation Age of Onset   CVA Mother    Parkinson's disease Father    CVA Father    Heart attack Father    Eczema Sister    Ulcerative colitis Brother    Heart failure Paternal Grandmother    Social History   Occupational History   Occupation: retired  Tobacco Use   Smoking status: Former    Current packs/day: 0.00    Types: Cigarettes    Start date: 01/01/1971    Quit date: 12/31/1980    Years since quitting: 43.0   Smokeless tobacco: Never   Tobacco comments:    smoked for 10 yeas, d/c 40 y ago  Vaping Use   Vaping status: Never Used  Substance and Sexual Activity   Alcohol use: Yes    Comment: occas.   Drug use: Never   Sexual activity: Yes    Birth control/protection: None    Comment: number of sex partners in the last 12 months  1   Tobacco Counseling Counseling given: Not Answered Tobacco comments: smoked for  10 yeas, d/c 40 y ago  SDOH Screenings   Food Insecurity: No Food Insecurity (12/27/2023)  Housing: Unknown (12/27/2023)  Transportation Needs: No Transportation Needs (12/27/2023)  Utilities: Not At Risk (12/27/2023)  Alcohol Screen: Low Risk (12/27/2023)  Depression (PHQ2-9): Low Risk (12/27/2023)  Financial Resource Strain: Low Risk (12/27/2023)  Physical Activity: Insufficiently Active (12/27/2023)  Social Connections: Moderately Isolated (12/27/2023)  Stress: No Stress Concern Present (12/27/2023)  Tobacco Use: Medium Risk (12/27/2023)  Health Literacy: Adequate Health Literacy (12/27/2023)   See flowsheets for full screening details  Depression Screen PHQ 2 & 9 Depression Scale- Over the past 2 weeks, how often have you been bothered by any of the following  problems? Little interest or pleasure in doing things: 0 Feeling down, depressed, or hopeless (PHQ Adolescent also includes...irritable): 0 PHQ-2 Total Score: 0 Trouble falling or staying asleep, or sleeping too much: 0 Feeling tired or having little energy: 0 Poor appetite or overeating (PHQ Adolescent also includes...weight loss): 0 Feeling bad about yourself - or that you are a failure or have let yourself or your family down: 0 Trouble concentrating on things, such as reading the newspaper or watching television (PHQ Adolescent also includes...like school work): 0 Moving or speaking so slowly that other people could have noticed. Or the opposite - being so fidgety or restless that you have been moving around a lot more than usual: 0 Thoughts that you would be better off dead, or of hurting yourself in some way: 0 PHQ-9 Total Score: 0 If you checked off any problems, how difficult have these problems made it for you to do your work, take care of things at home, or get along with other people?: Not difficult at all  Depression Treatment Depression Interventions/Treatment : EYV7-0 Score <4 Follow-up Not Indicated     Goals Addressed             This Visit's Progress    Patient Stated       12/27/2023, denies goals             Objective:    Today's Vitals   There is no height or weight on file to calculate BMI.  Hearing/Vision screen Hearing Screening - Comments:: Denies hearing issues Vision Screening - Comments:: Regular eye exams, Dr. Cleotilde Immunizations and Health Maintenance Health Maintenance  Topic Date Due   Influenza Vaccine  08/11/2023   COVID-19 Vaccine (6 - 2025-26 season) 09/11/2023   Mammogram  12/26/2023   Colonoscopy  12/26/2024   Medicare Annual Wellness (AWV)  12/26/2024   DTaP/Tdap/Td (3 - Td or Tdap) 06/13/2031   Pneumococcal Vaccine: 50+ Years  Completed   Bone Density Scan  Completed   Hepatitis C Screening  Completed   Zoster Vaccines-  Shingrix  Completed   Meningococcal B Vaccine  Aged Out        Assessment/Plan:  This is a routine wellness examination for Linda Allen.  Patient Care Team: Georgina Speaks, FNP as PCP - General (General Practice) O'Neal, Darryle Ned, MD as PCP - Cardiology (Cardiology) Livingston Rigg, MD as Consulting Physician (Dermatology) Cleotilde Sewer, OD (Optometry)  I have personally reviewed and noted the following in the patients chart:   Medical and social history Use of alcohol, tobacco or illicit drugs  Current medications and supplements including opioid prescriptions. Functional ability and status Nutritional status Physical activity Advanced directives List of other physicians Hospitalizations, surgeries, and ER visits in previous 12 months Vitals Screenings to include cognitive, depression, and falls Referrals and appointments  No orders of the defined types were placed in this encounter.  In addition, I have reviewed and discussed with patient certain preventive protocols, quality metrics, and best practice recommendations. A written personalized care plan for preventive services as well as general preventive health recommendations were provided to patient.   Ardella FORBES Dawn, LPN   [id number]   Return in 1 year (on 12/26/2024).  After Visit Summary: (Pick Up) Due to this being a telephonic visit, with patients personalized plan was offered to patient and patient has requested to Pick up at office.  Nurse Notes: HM Addressed: Vaccines Due: covid and flu. [facility] says they do not have record for 2025 Patient states that mammogram is scheduled.

## 2023-12-27 NOTE — Patient Instructions (Signed)
 Ms. Lacorte,  Thank you for taking the time for your Medicare Wellness Visit. I appreciate your continued commitment to your health goals. Please review the care plan we discussed, and feel free to reach out if I can assist you further.  Please note that Annual Wellness Visits do not include a physical exam. Some assessments may be limited, especially if the visit was conducted virtually. If needed, we may recommend an in-person follow-up with your provider.  Ongoing Care Seeing your primary care provider every 3 to 6 months helps us  monitor your health and provide consistent, personalized care.   Referrals If a referral was made during today's visit and you haven't received any updates within two weeks, please contact the referred provider directly to check on the status.  Recommended Screenings:  Health Maintenance  Topic Date Due   Flu Shot  08/11/2023   COVID-19 Vaccine (6 - 2025-26 season) 09/11/2023   Medicare Annual Wellness Visit  12/01/2023   Breast Cancer Screening  12/26/2023   Colon Cancer Screening  12/26/2024   DTaP/Tdap/Td vaccine (3 - Td or Tdap) 06/13/2031   Pneumococcal Vaccine for age over 22  Completed   Osteoporosis screening with Bone Density Scan  Completed   Hepatitis C Screening  Completed   Zoster (Shingles) Vaccine  Completed   Meningitis B Vaccine  Aged Out       12/27/2023    9:42 AM  Advanced Directives  Does Patient Have a Medical Advance Directive? Yes  Type of Estate Agent of [place];Living will  Does patient want to make changes to medical advance directive? No - Patient declined  Copy of Healthcare Power of Attorney in Chart? Yes - validated most recent copy scanned in chart (See row information)    Vision: Annual vision screenings are recommended for early detection of glaucoma, cataracts, and diabetic retinopathy. These exams can also reveal signs of chronic conditions such as diabetes and high blood  pressure.  Dental: Annual dental screenings help detect early signs of oral cancer, gum disease, and other conditions linked to overall health, including heart disease and diabetes.  Please see the attached documents for additional preventive care recommendations.

## 2024-01-09 LAB — HM MAMMOGRAPHY

## 2024-02-05 ENCOUNTER — Ambulatory Visit

## 2024-02-29 ENCOUNTER — Encounter: Payer: Self-pay | Admitting: Nurse Practitioner

## 2025-01-08 ENCOUNTER — Ambulatory Visit: Payer: Self-pay
